# Patient Record
Sex: Male | Born: 1935
Health system: Southern US, Community
[De-identification: ages and names within clinical notes are randomized; demographics above are authoritative.]

## PROBLEM LIST (undated history)

## (undated) DIAGNOSIS — N529 Male erectile dysfunction, unspecified: Secondary | ICD-10-CM

## (undated) DIAGNOSIS — C61 Malignant neoplasm of prostate: Secondary | ICD-10-CM

## (undated) DIAGNOSIS — I10 Essential (primary) hypertension: Secondary | ICD-10-CM

## (undated) DIAGNOSIS — D649 Anemia, unspecified: Secondary | ICD-10-CM

## (undated) DIAGNOSIS — I2510 Atherosclerotic heart disease of native coronary artery without angina pectoris: Secondary | ICD-10-CM

## (undated) DIAGNOSIS — E785 Hyperlipidemia, unspecified: Secondary | ICD-10-CM

## (undated) DIAGNOSIS — L570 Actinic keratosis: Secondary | ICD-10-CM

## (undated) DIAGNOSIS — N189 Chronic kidney disease, unspecified: Secondary | ICD-10-CM

## (undated) DIAGNOSIS — I219 Acute myocardial infarction, unspecified: Secondary | ICD-10-CM

## (undated) DIAGNOSIS — K219 Gastro-esophageal reflux disease without esophagitis: Secondary | ICD-10-CM

## (undated) DIAGNOSIS — E039 Hypothyroidism, unspecified: Secondary | ICD-10-CM

## (undated) DIAGNOSIS — R42 Dizziness and giddiness: Secondary | ICD-10-CM

## (undated) HISTORY — PX: CHOLECYSTECTOMY: SHX55

## (undated) HISTORY — PX: INGUINAL HERNIA REPAIR: SUR1180

## (undated) HISTORY — DX: Actinic keratosis: L57.0

## (undated) HISTORY — PX: COLONOSCOPY: SHX174

## (undated) HISTORY — PX: PROSTATECTOMY: SHX69

---

## 2006-06-28 ENCOUNTER — Ambulatory Visit: Payer: Self-pay | Admitting: Surgery

## 2006-06-28 ENCOUNTER — Other Ambulatory Visit: Payer: Self-pay

## 2006-07-05 ENCOUNTER — Ambulatory Visit: Payer: Self-pay | Admitting: Surgery

## 2007-04-13 ENCOUNTER — Ambulatory Visit: Payer: Self-pay | Admitting: Unknown Physician Specialty

## 2008-04-16 ENCOUNTER — Ambulatory Visit: Payer: Self-pay | Admitting: Urology

## 2008-05-07 ENCOUNTER — Ambulatory Visit: Payer: Self-pay | Admitting: Urology

## 2008-05-31 ENCOUNTER — Inpatient Hospital Stay (HOSPITAL_COMMUNITY): Admission: RE | Admit: 2008-05-31 | Discharge: 2008-06-01 | Payer: Self-pay | Admitting: Urology

## 2008-05-31 ENCOUNTER — Encounter (INDEPENDENT_AMBULATORY_CARE_PROVIDER_SITE_OTHER): Payer: Self-pay | Admitting: Urology

## 2009-05-28 ENCOUNTER — Ambulatory Visit: Payer: Self-pay | Admitting: Urology

## 2009-12-04 ENCOUNTER — Ambulatory Visit: Payer: Self-pay | Admitting: Urology

## 2010-10-01 ENCOUNTER — Ambulatory Visit: Payer: Self-pay | Admitting: Unknown Physician Specialty

## 2010-12-24 ENCOUNTER — Ambulatory Visit: Payer: Self-pay | Admitting: Urology

## 2011-04-07 NOTE — Discharge Summary (Signed)
NAMESERVANDO, KYLLONEN             ACCOUNT NO.:  [id number]   MEDICAL RECORD NO.:  [id number]          PATIENT TYPE:  INP   LOCATION:  1440                         FACILITY:  [facility]   PHYSICIAN:  Heloise Purpura, MD      DATE OF BIRTH:  07-16-1935   DATE OF ADMISSION:  05/31/2008  DATE OF DISCHARGE:  06/01/2008                               DISCHARGE SUMMARY   ADMISSION DIAGNOSIS:  Prostate cancer.   DISCHARGE DIAGNOSIS:  Prostate cancer.   HISTORY:  For full details, please see admission history and physical.  Briefly, Mr. Aber is a 76 year old gentleman with clinically  localized adenocarcinoma of the prostate.  After discussion regarding  management options for treatment, he elected to proceed with surgical  therapy and a radical prostatectomy.   HOSPITAL COURSE:  On May 31, 2008, the patient was taken to the  operating room and underwent a robotic assisted laparoscopic radical  prostatectomy.  He tolerated this procedure well without complications.  Postoperatively, he was able to be transferred to a regular hospital  room.  He remained hemodynamically stable and his hematocrit on  postoperative day 1 was found to be stable at 33.7.  He maintained  excellent urine output with minimal output from his pelvic drain and his  drain was able to be removed.  He was able to begin ambulating which he  did without difficulty.  He was gradually transitioned to oral pain  medication and began a clear liquid diet.  On the afternoon of  postoperative day 1, he had met all discharge criteria and was able to  be discharged home in excellent condition.   DISPOSITION:  Home.   DISCHARGE MEDICATIONS:  1. He was instructed to resume his regular home medications excepting      any aspirin, nonsteroidal anti-inflammatory drugs or herbal      supplements.  2. He was given a prescription to begin Vicodin as needed for pain and      told to use Colace as a stool softener.  3. He was also given a  prescription to begin Cipro 1 day prior to his      return visit for Foley catheter removal.   DISCHARGE INSTRUCTIONS:  1. He was instructed to be ambulatory, but specifically told to      refrain from any heavy lifting, strenuous activity or driving.  2. He was given instructions on routine Foley catheter care and was      instructed to gradually advance his diet once passing flatus.   FOLLOW UP:  He will plan to follow up in 1 week for removal of his Foley  catheter and to discuss his surgical pathology in detail.      Heloise Purpura, MD  Electronically Signed     LB/MEDQ  D:  06/01/2008  T:  06/01/2008  Job:  [id number]

## 2011-04-07 NOTE — Op Note (Signed)
Daniel Lewis, Daniel Lewis             ACCOUNT NO.:  [id number]   MEDICAL RECORD NO.:  [id number]          PATIENT TYPE:  INP   LOCATION:  1610                         FACILITY:  [facility]   PHYSICIAN:  Heloise Purpura, MD      DATE OF BIRTH:  September 11, 1935   DATE OF PROCEDURE:  05/31/2008  DATE OF DISCHARGE:                               OPERATIVE REPORT   PREOPERATIVE DIAGNOSIS:  Clinically localized adenocarcinoma of the  prostate (clinical stage T1C, NX, M0).   POSTOPERATIVE DIAGNOSIS:  Clinically localized adenocarcinoma of the  prostate (clinical stage T1C, NX, M0).  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   PROCEDURE:  Robotic-assisted laparoscopic radical prostatectomy  (bilateral nerve sparing).   SURGEON:  Heloise Purpura, MD   ASSISTANT:  Veverly Fells. Vernie Ammons, M.D.   ANESTHESIA:  General.   COMPLICATIONS:  None.   ESTIMATED BLOOD LOSS:  Approximately 100 mL.   SPECIMENS:  Prostate and seminal vesicles.   DISPOSITION:  Specimen to pathology.   DRAINS:  1. A 20-French Coude catheter.  2. A #19 Blake pelvic drain.   INDICATION:  The patient is a 76 year old gentleman with clinically  localized adenocarcinoma of the prostate.  After discussion regarding  management options for treatment, he elected to proceed with surgical  therapy and the above procedure.  Potential risks, complications, and  alternative options were discussed with the patient in detail and  informed consent was obtained.   DESCRIPTION OF PROCEDURE:  The patient was taken to the operating room  and a general anesthetic was administered.  He was given preoperative  antibiotics, placed in the dorsal lithotomy position, and prepped and  draped in the usual sterile fashion.  Next, a preoperative time-out was  performed.  A Foley catheter was inserted into the bladder and a site  was selected just superior to the umbilicus for placement of the camera  port.  This was placed using a standard  open Hassan technique.  This  allowed entry into the peritoneal cavity under direct vision and without  difficulty.  A 12-mm port was then placed and a pneumoperitoneum was  established.  The 0-degree lens was used to inspect the abdomen and  there was no evidence for any intra-abdominal injuries or other  abnormalities.  The patient's prior laparoscopic mesh hernia repair was  noted with mesh along the anterior abdominal wall.   The remaining ports were then placed.  Bilateral 8-mm robotic ports were  placed 10 cm lateral to and just inferior to the camera port site.  An  additional 8-mm robotic port was placed in the far left lateral  abdominal wall.  A 5-mm port was placed between the camera port and the  right robotic port.  An additional 12-mm port was placed in the far  right lateral abdominal wall for laparoscopic assistance.  All ports  were placed under direct vision and without difficulty.  The surgical  cart was then docked.  With the aid of the cautery scissors, the bladder  was reflected posteriorly, allowing entry into space of Retzius and  identification of the endopelvic  fascia and prostate.  During this  dissection, there really was minimal adhesions noted between the  laparoscopic hernia mesh and this did not provide significant difficulty  entering the space of Retzius and exposing the prostate.  The endopelvic  fascia was then incised from the apex back to the base of the prostate  bilaterally and the underlying levator muscle fibers were swept  laterally off the prostate.  The dorsal venous complex was then stapled  and divided with a 45-mm Flex ETS stapler.  The bladder neck was  identified with the aid of Foley catheter manipulation and was divided  anteriorly thereby exposing the catheter.   On inspection of the prostatic urethra, the patient was noted to have a  large right lateral lobe protruding into the bladder.  The bladder neck  did necessitate a fairly  extensive dissection to separate the prostatic  adenoma off of the bladder which did result in a somewhat large bladder  neck.  However, the bladder neck, which was somewhat thin, was carefully  dissected free from the prostatic tissue in order to minimize the large  size of the bladder neck.   Dissection proceeded between the bladder neck and prostate until the  vasa deferentia and seminal vesicles were identified.  On inspection of  the vas deferens, there initially appeared to be two vas deferens in the  midline.  The right vas deferens was isolated, divided and lifted  anteriorly.  The seminal vesicle on the right was then dissected down to  its tip with care to control the seminal vesicle arterial blood supply.  It was then lifted anteriorly.  On the left side, no vas deferens was  appreciated nor was a seminal vesicle. Of note, the patient was noted to  have absence of his left kidney on his preoperative bone scan.  Dr.  Vernie Ammons inspected the patient's scrotum and did not feel an obvious vas  deferens within the left scrotum consistent with left vasal and seminal  vesicle agenesis.  The space between the Denonvilliers' fascia and the  anterior rectum was then bluntly developed thereby isolating the  vascular pedicle of the prostate.  On the left side, dissection  proceeded until the vascular pedicle was isolated.  Again, no vas  deferens or seminal vesicle was seen on this side.   Attention was then turned to the nerve-sparing portion of the procedure.  The lateral prostatic fascia was incised bilaterally allowing the  neurovascular bundles to be swept laterally and posteriorly off the  prostate.  This isolated the vascular pedicles of the prostate which  were then ligated with Hem-o-lok clips and divided with sharp cold  scissor dissection above the level of the neurovascular bundles.  The  neurovascular bundles were then swept off the apex of prostate and  urethra.  The urethra  was then divided, allowing it to be sharply  disarticulated.  The pelvis was then copiously irrigated and hemostasis  was ensured.  There was no evidence for a rectal injury.   Attention then turned to the urethral anastomosis.  Prior to the  anastomosis, the bladder neck was reconstructed with figure-of-eight 3-0  Vicryl sutures at the 5 and 7 o'clock position.  A 2-0 Vicryl slip-knot  was then placed between Denonvilliers' fascia, the posterior urethra and  the posterior bladder neck to reapproximate these structures.  A double-  armed 3-0 Monocryl suture was then used to perform a 360-degree running  tension-free anastomosis between the bladder neck and urethra.  A new 20-  [place] Coude catheter was inserted into the bladder and irrigated.  The  anastomosis appeared to be watertight and there were no blood clots  within the bladder.  A #19 Blake drain was brought through the left  robotic port and appropriately positioned in the pelvis.  It was secured  to the skin with a nylon suture.   The surgical cart was then undocked.  The right lateral 12-mm port site  was closed with a 0 Vicryl suture placed with the aid of the suture  passer device.  All remaining ports were then removed under direct  vision and the prostate was removed intact within the Endopouch  retrieval bag.  This fascial opening was then closed with a running 0  Vicryl suture.  All port sites were injected with 0.25% Marcaine and  reapproximated at the skin level with staples.  Sterile dressings were  applied.  The patient appeared to tolerate the procedure well without  complications.  He was able to be extubated and transferred to the  recovery unit in satisfactory condition.      Heloise Purpura, MD  Electronically Signed     LB/MEDQ  D:  05/31/2008  T:  05/31/2008  Job:  [id number]

## 2011-05-01 ENCOUNTER — Ambulatory Visit: Payer: Self-pay | Admitting: Surgery

## 2011-05-08 ENCOUNTER — Ambulatory Visit: Payer: Self-pay | Admitting: Surgery

## 2011-05-12 ENCOUNTER — Ambulatory Visit: Payer: Self-pay | Admitting: Unknown Physician Specialty

## 2011-05-15 ENCOUNTER — Ambulatory Visit: Payer: Self-pay | Admitting: Unknown Physician Specialty

## 2011-08-20 LAB — BASIC METABOLIC PANEL
CO2: 32
Calcium: 9.3
Creatinine, Ser: 1.03
GFR calc Af Amer: >60
Glucose, Bld: 84

## 2011-08-20 LAB — TYPE AND SCREEN
ABO/RH(D): A NEG
Antibody Screen: NEGATIVE

## 2011-08-20 LAB — CBC
MCHC: 34.1
RBC: 4.21 — ABNORMAL LOW
RDW: 12.7

## 2012-08-10 DIAGNOSIS — N529 Male erectile dysfunction, unspecified: Secondary | ICD-10-CM | POA: Insufficient documentation

## 2012-08-10 DIAGNOSIS — N393 Stress incontinence (female) (male): Secondary | ICD-10-CM | POA: Insufficient documentation

## 2012-08-10 DIAGNOSIS — N200 Calculus of kidney: Secondary | ICD-10-CM | POA: Insufficient documentation

## 2012-08-10 DIAGNOSIS — R339 Retention of urine, unspecified: Secondary | ICD-10-CM | POA: Insufficient documentation

## 2012-08-10 DIAGNOSIS — C61 Malignant neoplasm of prostate: Secondary | ICD-10-CM | POA: Insufficient documentation

## 2012-08-10 DIAGNOSIS — Q600 Renal agenesis, unilateral: Secondary | ICD-10-CM | POA: Insufficient documentation

## 2012-08-10 DIAGNOSIS — R350 Frequency of micturition: Secondary | ICD-10-CM | POA: Insufficient documentation

## 2013-09-18 DIAGNOSIS — Z8546 Personal history of malignant neoplasm of prostate: Secondary | ICD-10-CM | POA: Insufficient documentation

## 2013-09-18 DIAGNOSIS — N139 Obstructive and reflux uropathy, unspecified: Secondary | ICD-10-CM | POA: Insufficient documentation

## 2014-02-21 ENCOUNTER — Emergency Department: Payer: Self-pay | Admitting: Internal Medicine

## 2014-04-10 DIAGNOSIS — I2510 Atherosclerotic heart disease of native coronary artery without angina pectoris: Secondary | ICD-10-CM | POA: Insufficient documentation

## 2014-04-10 DIAGNOSIS — E785 Hyperlipidemia, unspecified: Secondary | ICD-10-CM | POA: Insufficient documentation

## 2014-04-10 DIAGNOSIS — I10 Essential (primary) hypertension: Secondary | ICD-10-CM | POA: Insufficient documentation

## 2014-04-10 DIAGNOSIS — E039 Hypothyroidism, unspecified: Secondary | ICD-10-CM | POA: Insufficient documentation

## 2014-04-10 DIAGNOSIS — D649 Anemia, unspecified: Secondary | ICD-10-CM | POA: Insufficient documentation

## 2015-09-03 ENCOUNTER — Encounter: Payer: Self-pay | Admitting: Emergency Medicine

## 2015-09-03 ENCOUNTER — Emergency Department
Admission: EM | Admit: 2015-09-03 | Discharge: 2015-09-04 | Disposition: A | Discharge status: Home / Self Care | Payer: PPO | Attending: Emergency Medicine | Admitting: Emergency Medicine

## 2015-09-03 ENCOUNTER — Emergency Department: Payer: PPO

## 2015-09-03 DIAGNOSIS — Z7982 Long term (current) use of aspirin: Secondary | ICD-10-CM | POA: Diagnosis not present

## 2015-09-03 DIAGNOSIS — Z79899 Other long term (current) drug therapy: Secondary | ICD-10-CM | POA: Insufficient documentation

## 2015-09-03 DIAGNOSIS — R42 Dizziness and giddiness: Secondary | ICD-10-CM | POA: Diagnosis present

## 2015-09-03 DIAGNOSIS — H8110 Benign paroxysmal vertigo, unspecified ear: Secondary | ICD-10-CM

## 2015-09-03 HISTORY — DX: Malignant neoplasm of prostate: C61

## 2015-09-03 HISTORY — DX: Acute myocardial infarction, unspecified: I21.9

## 2015-09-03 LAB — BASIC METABOLIC PANEL
ANION GAP: 6 (ref 5–15)
BUN: 28 mg/dL — ABNORMAL HIGH (ref 6–20)
CHLORIDE: 105 mmol/L (ref 101–111)
CO2: 29 mmol/L (ref 22–32)
Calcium: 8.9 mg/dL (ref 8.9–10.3)
Creatinine, Ser: 1.21 mg/dL (ref 0.61–1.24)
GFR calc Af Amer: >60 mL/min (ref >60)
GFR calc non Af Amer: 56 mL/min — ABNORMAL LOW (ref >60)
GLUCOSE: 214 mg/dL — AB (ref 65–99)
POTASSIUM: 4.2 mmol/L (ref 3.5–5.1)
Sodium: 140 mmol/L (ref 135–145)

## 2015-09-03 LAB — CBC
HEMATOCRIT: 39.4 % — AB (ref 40.0–52.0)
HEMOGLOBIN: 13.7 g/dL (ref 13.0–18.0)
MCH: 32.8 pg (ref 26.0–34.0)
MCHC: 34.8 g/dL (ref 32.0–36.0)
MCV: 94.1 fL (ref 80.0–100.0)
Platelets: 208 10*3/uL (ref 150–440)
RBC: 4.19 MIL/uL — ABNORMAL LOW (ref 4.40–5.90)
RDW: 12.7 % (ref 11.5–14.5)
WBC: 8.2 10*3/uL (ref 3.8–10.6)

## 2015-09-03 LAB — GLUCOSE, CAPILLARY: GLUCOSE-CAPILLARY: 197 mg/dL — AB (ref 65–99)

## 2015-09-03 LAB — TROPONIN I: Troponin I: <0.03 ng/mL (ref <0.031)

## 2015-09-03 MED ORDER — MECLIZINE HCL 12.5 MG PO TABS: 12.5000 mg | ORAL_TABLET | Freq: Three times a day (TID) | ORAL | Status: AC | PRN

## 2015-09-03 MED ORDER — ONDANSETRON HCL 4 MG PO TABS: 4.0000 mg | ORAL_TABLET | Freq: Three times a day (TID) | ORAL | Status: DC | PRN

## 2015-09-03 MED ORDER — TRAMADOL HCL 50 MG PO TABS: 50.0000 mg | ORAL_TABLET | Freq: Four times a day (QID) | ORAL | Status: DC | PRN

## 2015-09-03 MED ORDER — MECLIZINE HCL 25 MG PO TABS
25.0000 mg | ORAL_TABLET | Freq: Once | ORAL | Status: AC
  Administered 2015-09-03: 25 mg via ORAL
  Filled 2015-09-03: qty 1

## 2015-09-03 NOTE — ED Notes (Signed)
Pt uprite on stretcher in exam room with no distress noted; reports since Sunday having intermittent periods of frontal HA accomp by dizziness and nausea; st hx of vertigo but not accomp by HA or vomiting; pt denies any c/o at present; A&Ox3, PERRL, MAEW, speech clear

## 2015-09-03 NOTE — ED Provider Notes (Signed)
Time Seen: Approximately insert time stent  I have reviewed the triage notes  Chief Complaint: Dizziness; Emesis; Headache; and Near Syncope   History of Present Illness: Daniel Lewis is a 80 y.o. male who is pleasant gentleman who states that he's developed some periodic feelings of dizziness. On further investigation and he describes transient vertigo type symptoms of feeling off balance for "" a couple of seconds."". The patient states he also has an associated frontal headache. He denies any weakness in either upper or lower extremities. He denies any difficulty with blurred vision, visual field deficit deficits, posterior neck pain, patient states he's had some occasional nausea and vomited 1 with no blood or bile. He describes his symptoms as occurring mostly with ambulation but also developed similar symptoms tonight and while he was on church and in Sunday while he was at a seated position. He has no persistent vertiginous symptoms and denies any ear pain. He describes an episode where he felt his heartbeat in his ureter but otherwise no consistent symptoms.   Past Medical History  Diagnosis Date  . MI (myocardial infarction) ([place])   . Prostate cancer ([place])     There are no active problems to display for this patient.   Past Surgical History  Procedure Laterality Date  . Inguinal hernia repair    . Prostatectomy      Past Surgical History  Procedure Laterality Date  . Inguinal hernia repair    . Prostatectomy      Current Outpatient Rx  Name  Route  Sig  Dispense  Refill  . aspirin EC 81 MG tablet   Oral   Take 1 tablet by mouth daily.         Marland Kitchen atenolol (TENORMIN) 50 MG tablet   Oral   Take 1 tablet by mouth daily.      0   . SYNTHROID 75 MCG tablet   Oral   Take 1 tablet by mouth every morning.      0     Dispense as written.   . TRAVATAN Z 0.004 % SOLN ophthalmic solution   Both Eyes   Place 1 drop into both eyes at bedtime.      0      Dispense as written.   . vitamin C (ASCORBIC ACID) 500 MG tablet   Oral   Take 1 tablet by mouth daily.         . meclizine (ANTIVERT) 12.5 MG tablet   Oral   Take 1 tablet (12.5 mg total) by mouth 3 (three) times daily as needed for dizziness or nausea.   12 tablet   1   . ondansetron (ZOFRAN) 4 MG tablet   Oral   Take 1 tablet (4 mg total) by mouth every 8 (eight) hours as needed for nausea or vomiting.   21 tablet   0   . traMADol (ULTRAM) 50 MG tablet   Oral   Take 1 tablet (50 mg total) by mouth every 6 (six) hours as needed.   20 tablet   0     Allergies:  Review of patient's allergies indicates no known allergies.  Family History: No family history on file.  Social History: Social History  Substance Use Topics  . Smoking status: Never Smoker   . Smokeless tobacco: Never Used  . Alcohol Use: No     Review of Systems:   10 point review of systems was performed and was otherwise negative:  Constitutional: No  fever Eyes: No visual disturbances ENT: No sore throat, ear pain Cardiac: No chest pain Respiratory: No shortness of breath, wheezing, or stridor Abdomen: No abdominal pain, no vomiting, No diarrhea Endocrine: No weight loss, No night sweats Extremities: No peripheral edema, cyanosis Skin: No rashes, easy bruising Neurologic: No focal weakness, trouble with speech or swollowing Urologic: No dysuria, Hematuria, or urinary frequency   Physical Exam:  ED Triage Vitals  Enc Vitals Group     BP 09/03/15 2059 165/75 mmHg     Pulse Rate 09/03/15 2059 5     Resp 09/03/15 2059 20     Temp 09/03/15 2059 98.1 F (36.7 C)     Temp Source 09/03/15 2059 Oral     SpO2 09/03/15 2059 96 %     Weight 09/03/15 2059 140 lb (63.504 kg)     Height 09/03/15 2059 5\' 9"  (1.753 m)     Head Cir --      Peak Flow --      Pain Score 09/03/15 2059 0     Pain Loc --      Pain Edu? --      Excl. in [place]? --     General: Awake , Alert , and Oriented times 3; GCS  15 Head: Normal cephalic , atraumatic Eyes: Pupils equal , round, reactive to light. Patient has 5 beat left to upper nystagmus which is exhaustive. Nose/Throat: No nasal drainage, patent upper airway without erythema or exudate.  TMs are negative for erythema, exudate, or lesions in the external ear canal Neck: Supple, Full range of motion, No anterior adenopathy or palpable thyroid masses. No carotid bruits Lungs: Clear to ascultation without wheezes , rhonchi, or rales Heart: Regular rate, regular rhythm without murmurs , gallops , or rubs Abdomen: Soft, non tender without rebound, guarding , or rigidity; bowel sounds positive and symmetric in all 4 quadrants. No organomegaly .        Extremities: 2 plus symmetric pulses. No edema, clubbing or cyanosis Neurologic: normal ambulation, Motor symmetric without deficits, sensory intact. normal gait Negative drift and cerebellar signs Skin: warm, dry, no rashes   Labs:   All laboratory work was reviewed including any pertinent negatives or positives listed below:  Labs Reviewed  BASIC METABOLIC PANEL - Abnormal; Notable for the following:    Glucose, Bld 214 (*)    BUN 28 (*)    GFR calc non Af Amer 56 (*)    All other components within normal limits  CBC - Abnormal; Notable for the following:    RBC 4.19 (*)    HCT 39.4 (*)    All other components within normal limits  GLUCOSE, CAPILLARY - Abnormal; Notable for the following:    Glucose-Capillary 197 (*)    All other components within normal limits  TROPONIN I  CBG MONITORING, ED   patient's laboratory work was reviewed with no significant abnormalities  EKG:  ED ECG REPORT I, Daymon Larsen, the attending physician, personally viewed and interpreted this ECG.  Date: 09/03/2015 EKG Time: 2118 Rate: 50 Rhythm: normal sinus rhythm QRS Axis: normal Intervals: normal ST/T Wave abnormalities: normal Conduction Disutrbances: none Narrative Interpretation:  unremarkable    Radiology: EXAM: CT HEAD WITHOUT CONTRAST  TECHNIQUE: Contiguous axial images were obtained from the base of the skull through the vertex without intravenous contrast.  COMPARISON: 02/21/2014 and prior studies  FINDINGS: Mild generalized cerebral volume loss and mild chronic small-vessel white matter ischemic changes again noted.  No acute  intracranial abnormalities are identified, including mass lesion or mass effect, hydrocephalus, extra-axial fluid collection, midline shift, hemorrhage, or acute infarction.  The visualized bony calvarium is unremarkable.  IMPRESSION: No evidence of acute intracranial abnormality.  Mild atrophy and chronic small-vessel white matter ischemic changes     I personally reviewed the radiologic studies     ED Course:  Differential diagnosis includes but is not exclusive to subarachnoid hemorrhage, meningitis, encephalitis, previous head trauma, cavernous venous thrombosis, muscle tension headache, temporal arteritis, migraine or migraine equivalent, etc.  Given the patient's presentation is not seem to be a life-threatening cause for his headache. He describes a pressure final headache without any photophobia weakness no pain fever or any other concerns. CT was negative and he does have some stagnant mass on physical exam and I felt this was most likely to be peripheral vertigo answers no cerebellar signs etc. Patient exhibits no signs of weakness in either upper or lower extremities otherwise during my evaluation felt fine.    Assessment:  Acute unspecified headache Peripheral vertigo     Plan:  Outpatient management Patient was advised to return immediately if condition worsens. Patient was advised to follow up with her primary care physician or other specialized physicians involved and in their current assessment. Patient was prescribed Antivert along with Ultram for his headache. He may need further outpatient  evaluation including an MRI.Daymon Larsen, MD 09/03/15 2325

## 2015-09-03 NOTE — Discharge Instructions (Signed)

## 2015-09-03 NOTE — ED Notes (Signed)
Pt presents to ED with periods of dizziness and nausea since Sunday. Pt reports today his symptoms worsened and he developed a severe headache and vomited X2. States at times he feels like he is going to pass out. Hx of MI; denies chest pain or sob. Pt currently alert and calm with no increased work of breathing or acute distress noted.

## 2015-11-09 ENCOUNTER — Emergency Department
Admission: EM | Admit: 2015-11-09 | Discharge: 2015-11-09 | Disposition: A | Discharge status: Home / Self Care | Payer: PPO | Attending: Emergency Medicine | Admitting: Emergency Medicine

## 2015-11-09 DIAGNOSIS — W540XXA Bitten by dog, initial encounter: Secondary | ICD-10-CM | POA: Insufficient documentation

## 2015-11-09 DIAGNOSIS — Y9289 Other specified places as the place of occurrence of the external cause: Secondary | ICD-10-CM | POA: Diagnosis not present

## 2015-11-09 DIAGNOSIS — S31159A Open bite of abdominal wall, unspecified quadrant without penetration into peritoneal cavity, initial encounter: Secondary | ICD-10-CM | POA: Diagnosis present

## 2015-11-09 DIAGNOSIS — S3131XA Laceration without foreign body of scrotum and testes, initial encounter: Secondary | ICD-10-CM | POA: Insufficient documentation

## 2015-11-09 DIAGNOSIS — Z79899 Other long term (current) drug therapy: Secondary | ICD-10-CM | POA: Insufficient documentation

## 2015-11-09 DIAGNOSIS — Z7982 Long term (current) use of aspirin: Secondary | ICD-10-CM | POA: Insufficient documentation

## 2015-11-09 DIAGNOSIS — Y998 Other external cause status: Secondary | ICD-10-CM | POA: Insufficient documentation

## 2015-11-09 DIAGNOSIS — Y9389 Activity, other specified: Secondary | ICD-10-CM | POA: Insufficient documentation

## 2015-11-09 DIAGNOSIS — S31139A Puncture wound of abdominal wall without foreign body, unspecified quadrant without penetration into peritoneal cavity, initial encounter: Secondary | ICD-10-CM

## 2015-11-09 DIAGNOSIS — S3123XA Puncture wound without foreign body of penis, initial encounter: Secondary | ICD-10-CM | POA: Insufficient documentation

## 2015-11-09 MED ORDER — AMOXICILLIN-POT CLAVULANATE 875-125 MG PO TABS: 1.0000 | ORAL_TABLET | Freq: Two times a day (BID) | ORAL | Status: DC

## 2015-11-09 MED ORDER — AMOXICILLIN-POT CLAVULANATE 875-125 MG PO TABS
1.0000 | ORAL_TABLET | Freq: Once | ORAL | Status: AC
  Administered 2015-11-09: 1 via ORAL
  Filled 2015-11-09: qty 1

## 2015-11-09 NOTE — ED Notes (Signed)
Dog bite to groin. Break in skin, no bleeding at this time. Pt denies pain. Pt alert and oriented X4, active, cooperative, pt in NAD. RR even and unlabored, color WNL.  Neighbors dog.

## 2015-11-09 NOTE — ED Notes (Signed)
Pt bit by his son's dog in the groin. Has a laceration to penis and a puncture wound to the scrotum. Bleeding controlled but oozes when he moves.

## 2015-11-09 NOTE — ED Provider Notes (Signed)
[facility] Emergency Department Provider Note  ____________________________________________  Time seen: Approximately 7:45 PM  I have reviewed the triage vital signs and the nursing notes.   HISTORY  Chief Complaint Animal Bite    HPI Daniel Lewis is a 80 y.o. male who presents emergency department for a dog bite to his groin. He states that he was messing around with his son's dog when he was actively. The groin area. States he has a laceration to this scrotum and a small puncture wound to his penis. He states that bleeding is controlled with a lot of movement is starts to ooze again. Patient denies any dysuria or hematuria. No other injury or complaints. He states the pain is minimal.He has received a tetanus shot within the last year.   Past Medical History  Diagnosis Date  . MI (myocardial infarction) ([place])   . Prostate cancer ([place])     There are no active problems to display for this patient.   Past Surgical History  Procedure Laterality Date  . Inguinal hernia repair    . Prostatectomy      Current Outpatient Rx  Name  Route  Sig  Dispense  Refill  . amoxicillin-clavulanate (AUGMENTIN) 875-125 MG tablet   Oral   Take 1 tablet by mouth 2 (two) times daily.   14 tablet   0   . aspirin EC 81 MG tablet   Oral   Take 1 tablet by mouth daily.         Marland Kitchen atenolol (TENORMIN) 50 MG tablet   Oral   Take 1 tablet by mouth daily.      0   . ondansetron (ZOFRAN) 4 MG tablet   Oral   Take 1 tablet (4 mg total) by mouth every 8 (eight) hours as needed for nausea or vomiting.   21 tablet   0   . SYNTHROID 75 MCG tablet   Oral   Take 1 tablet by mouth every morning.      0     Dispense as written.   . traMADol (ULTRAM) 50 MG tablet   Oral   Take 1 tablet (50 mg total) by mouth every 6 (six) hours as needed.   20 tablet   0   . TRAVATAN Z 0.004 % SOLN ophthalmic solution   Both Eyes   Place 1 drop into both eyes at  bedtime.      0     Dispense as written.   . vitamin C (ASCORBIC ACID) 500 MG tablet   Oral   Take 1 tablet by mouth daily.           Allergies Review of patient's allergies indicates no known allergies.  No family history on file.  Social History Social History  Substance Use Topics  . Smoking status: Never Smoker   . Smokeless tobacco: Never Used  . Alcohol Use: No    Review of Systems Constitutional: No fever/chills Eyes: No visual changes. ENT: No sore throat. Cardiovascular: Denies chest pain. Respiratory: Denies shortness of breath. Gastrointestinal: No abdominal pain.  No nausea, no vomiting.  No diarrhea.  No constipation. Genitourinary: Negative for dysuria. Musculoskeletal: Negative for back pain. Skin: Negative for rash. Puncture wound to the penis and laceration to scrotum. Neurological: Negative for headaches, focal weakness or numbness.  10-point ROS otherwise negative.  ____________________________________________   PHYSICAL EXAM:  VITAL SIGNS: ED Triage Vitals  Enc Vitals Group     BP 11/09/15 1745 146/59 mmHg  Pulse Rate 11/09/15 1745 58     Resp 11/09/15 1745 20     Temp 11/09/15 1745 98 F (36.7 C)     Temp Source 11/09/15 1745 Oral     SpO2 11/09/15 1745 96 %     Weight 11/09/15 1745 150 lb (68.04 kg)     Height 11/09/15 1745 5\' 9"  (1.753 m)     Head Cir --      Peak Flow --      Pain Score --      Pain Loc --      Pain Edu? --      Excl. in [place]? --     Constitutional: Alert and oriented. Well appearing and in no acute distress. Eyes: Conjunctivae are normal. PERRL. EOMI. Head: Atraumatic. Nose: No congestion/rhinnorhea. Mouth/Throat: Mucous membranes are moist.  Oropharynx non-erythematous. Neck: No stridor.   Cardiovascular: Normal rate, regular rhythm. Grossly normal heart sounds.  Good peripheral circulation. Respiratory: Normal respiratory effort.  No retractions. Lungs CTAB. Gastrointestinal: Soft and nontender. No  distention. No abdominal bruits. No CVA tenderness. Musculoskeletal: No lower extremity tenderness nor edema.  No joint effusions. Neurologic:  Normal speech and language. No gross focal neurologic deficits are appreciated. No gait instability. Skin:  Skin is warm, dry and intact. No rash noted. Patient has a small puncture wound to the lateral aspect of the right side of his penis. Area is not bleeding. Patient also has a laceration to the left scrotum. Area is mainly scabbed over with minimal oozing noted on the proximal edge of the laceration. Laceration is superficial in nature. This laceration does not penetrate the scrotum. Psychiatric: Mood and affect are normal. Speech and behavior are normal.  ____________________________________________   LABS (all labs ordered are listed, but only abnormal results are displayed)  Labs Reviewed - No data to display ____________________________________________  EKG   ____________________________________________  RADIOLOGY   ____________________________________________   PROCEDURES  Procedure(s) performed: None  Critical Care performed: No  ____________________________________________   INITIAL IMPRESSION / ASSESSMENT AND PLAN / ED COURSE  Pertinent labs & imaging results that were available during my care of the patient were reviewed by me and considered in my medical decision making (see chart for details).  The patient presents emergency Department with a dog bite to his groin. Inspection reveals laceration and puncture wound. Minimal bleeding noted that is controlled at rest. Laceration does not puncture into the scrotum. Patient will be discharged home with antibiotics and strict precautions to return for any worsening of symptoms or continued bleeding. Patient given instructions on care to area.   New Prescriptions   AMOXICILLIN-CLAVULANATE (AUGMENTIN) 875-125 MG TABLET    Take 1 tablet by mouth 2 (two) times daily.     ____________________________________________   FINAL CLINICAL IMPRESSION(S) / ED DIAGNOSES  Final diagnoses:  Dog bite  Puncture wound of groin, initial encounter      Darletta Moll, PA-C 11/09/15 2021  Nena Polio, MD 11/10/15 [phone]

## 2015-11-09 NOTE — Discharge Instructions (Signed)
Puncture Wound A puncture wound is an injury that is caused by a sharp, thin object that goes through (penetrates) your skin. Usually, a puncture wound does not leave a large opening in your skin, so it may not bleed a lot. However, when you get a puncture wound, dirt or other materials (foreign bodies) can be forced into your wound and break off inside. This increases the chance of infection, such as tetanus. CAUSES Puncture wounds are caused by any sharp, thin object that goes through your skin, such as:  Animal teeth, as with an animal bite.  Sharp, pointed objects, such as nails, splinters of glass, fishhooks, and needles. SYMPTOMS Symptoms of a puncture wound include:  Pain.  Bleeding.  Swelling.  Bruising.  Fluid leaking from the wound.  Numbness, tingling, or loss of function. DIAGNOSIS This condition is diagnosed with a medical history and physical exam. Your wound will be checked to see if it contains any foreign bodies. You may also have X-rays or other imaging tests. TREATMENT Treatment for a puncture wound depends on how serious the wound is. It also depends on whether the wound contains any foreign bodies. Treatment for all types of puncture wounds usually starts with:  Controlling the bleeding.  Washing out the wound with a germ-free (sterile) salt-water solution.  Checking the wound for foreign bodies. Treatment may also include:  Having the wound opened surgically to remove a foreign object.  Closing the wound with stitches (sutures) if it continues to bleed.  Covering the wound with antibiotic ointments and a bandage (dressing).  Receiving a tetanus shot.  Receiving a rabies vaccine. HOME CARE INSTRUCTIONS Medicines  Take or apply over-the-counter and prescription medicines only as told by your health care provider.  If you were prescribed an antibiotic, take or apply it as told by your health care provider. Do not stop using the antibiotic even if  your condition improves. Wound Care  There are many ways to close and cover a wound. For example, a wound can be covered with sutures, skin glue, or adhesive strips. Follow instructions from your health care provider about:  How to take care of your wound.  When and how you should change your dressing.  When you should remove your dressing.  Removing whatever was used to close your wound.  Keep the dressing dry as told by your health care provider. Do not take baths, swim, use a hot tub, or do anything that would put your wound underwater until your health care provider approves.  Clean the wound as told by your health care provider.  Do not scratch or pick at the wound.  Check your wound every day for signs of infection. Watch for:  Redness, swelling, or pain.  Fluid, blood, or pus. General Instructions  Raise (elevate) the injured area above the level of your heart while you are sitting or lying down.  If your puncture wound is in your foot, ask your health care provider if you need to avoid putting weight on your foot and for how long.  Keep all follow-up visits as told by your health care provider. This is important. SEEK MEDICAL CARE IF:  You received a tetanus shot and you have swelling, severe pain, redness, or bleeding at the injection site.  You have a fever.  Your sutures come out.  You notice a bad smell coming from your wound or your dressing.  You notice something coming out of your wound, such as wood or glass.  Your   pain is not controlled with medicine.  You have increased redness, swelling, or pain at the site of your wound.  You have fluid, blood, or pus coming from your wound.  You notice a change in the color of your skin near your wound.  You need to change the dressing frequently due to fluid, blood, or pus draining from your wound.  You develop a new rash.  You develop numbness around your wound. SEEK IMMEDIATE MEDICAL CARE IF:  You  develop severe swelling around your wound.  Your pain suddenly increases and is severe.  You develop painful skin lumps.  You have a red streak going away from your wound.  The wound is on your hand or foot and you cannot properly move a finger or toe.  The wound is on your hand or foot and you notice that your fingers or toes look pale or bluish.   This information is not intended to replace advice given to you by your health care provider. Make sure you discuss any questions you have with your health care provider.   Document Released: 08/19/2005 Document Revised: 07/31/2015 Document Reviewed: 01/02/2015 Elsevier Interactive Patient Education 2016 [place] Taking care of your wound properly can help to prevent pain and infection. It can also help your wound to heal more quickly.  HOW TO CARE FOR YOUR WOUND  Take or apply over-the-counter and prescription medicines only as told by your health care provider.  If you were prescribed antibiotic medicine, take or apply it as told by your health care provider. Do not stop using the antibiotic even if your condition improves.  Clean the wound each day or as told by your health care provider.  Wash the wound with mild soap and water.  Rinse the wound with water to remove all soap.  Pat the wound dry with a clean towel. Do not rub it.  There are many different ways to close and cover a wound. For example, a wound can be covered with stitches (sutures), skin glue, or adhesive strips. Follow instructions from your health care provider about:  How to take care of your wound.  When and how you should change your bandage (dressing).  When you should remove your dressing.  Removing whatever was used to close your wound.  Check your wound every day for signs of infection. Watch for:  Redness, swelling, or pain.  Fluid, blood, or pus.  Keep the dressing dry until your health care provider says it can be removed.  Do not take baths, swim, use a hot tub, or do anything that would put your wound underwater until your health care provider approves.  Raise (elevate) the injured area above the level of your heart while you are sitting or lying down.  Do not scratch or pick at the wound.  Keep all follow-up visits as told by your health care provider. This is important. SEEK MEDICAL CARE IF:  You received a tetanus shot and you have swelling, severe pain, redness, or bleeding at the injection site.  You have a fever.  Your pain is not controlled with medicine.  You have increased redness, swelling, or pain at the site of your wound.  You have fluid, blood, or pus coming from your wound.  You notice a bad smell coming from your wound or your dressing. SEEK IMMEDIATE MEDICAL CARE IF:  You have a red streak going away from your wound.   This information is not intended to replace  advice given to you by your health care provider. Make sure you discuss any questions you have with your health care provider.   Document Released: 08/18/2008 Document Revised: 03/26/2015 Document Reviewed: 11/05/2014 Elsevier Interactive Patient Education 2016 [employer].   Ship broker bites can range from mild to serious. An animal bite can result in a scratch on the skin, a deep open cut, a puncture of the skin, a crush injury, or tearing away of the skin or a body part. A small bite from a house pet will usually not cause serious problems. However, some animal bites can become infected or injure a bone or other tissue.  Bites from certain animals can be more dangerous because of the risk of spreading rabies, which is a serious viral infection. This risk is higher with bites from stray animals or wild animals, such as raccoons, foxes, skunks, and bats. Dogs are responsible for most animal bites. Children are bitten more often than adults.  SYMPTOMS  Common symptoms of an animal bite include:  Pain.    Bleeding.  Swelling.  Bruising.  DIAGNOSIS  This condition may be diagnosed based on a physical exam and medical history. Your health care provider will examine the wound and ask for details about the animal and how the bite happened. You may also have tests, such as:  Blood tests to check for infection or to determine if surgery is needed.  X-rays to check for damage to bones or joints.  Culture test. This uses a sample of fluid from the wound to check for infection. TREATMENT  Treatment varies depending on the location and type of animal bite and your medical history. Treatment may include:  Wound care. This often includes cleaning the wound, flushing the wound with saline solution, and applying a bandage (dressing). Sometimes, the wound is left open to heal because of the high risk of infection. However, in some cases, the wound may be closed with stitches (sutures), staples, skin glue, or adhesive strips.  Antibiotic medicine.  Tetanus shot.  Rabies treatment if the animal could have rabies.  In some cases, bites that have become infected may require IV antibiotics and surgical treatment in the hospital.  [place]  Follow instructions from your health care provider about how to take care of your wound. Make sure you:  Wash your hands with soap and water before you change your dressing. If soap and water are not available, use hand sanitizer.  Change your dressing as told by your health care provider.  Leave sutures, skin glue, or adhesive strips in place. These skin closures may need to be in place for 2 weeks or longer. If adhesive strip edges start to loosen and curl up, you may trim the loose edges. Do not remove adhesive strips completely unless your health care provider tells you to do that. Check your wound every day for signs of infection. Watch for:  Increasing redness, swelling, or pain.  Fluid, blood, or pus.  General Instructions  Take or apply  over-the-counter and prescription medicines only as told by your health care provider.  If you were prescribed an antibiotic, take or apply it as told by your health care provider. Do not stop using the antibiotic even if your condition improves.  Keep the injured area raised (elevated) above the level of your heart while you are sitting or lying down, if this is possible.  If directed, apply ice to the injured area.  Put ice in a plastic bag.  Place a towel between your skin and the bag.  Leave the ice on for 20 minutes, 2-3 times per day.  Keep all follow-up visits as told by your health care provider. This is important.  SEEK MEDICAL CARE IF:  You have increasing redness, swelling, or pain at the site of your wound.  You have a general feeling of sickness (malaise).  You feel nauseous or you vomit.  You have pain that does not get better.  SEEK IMMEDIATE MEDICAL CARE IF:  You have a red streak extending away from your wound.  You have fluid, blood, or pus coming from your wound.  You have a fever or chills.  You have trouble moving your injured area.  You have numbness or tingling extending beyond the wound. This information is not intended to replace advice given to you by your health care provider. Make sure you discuss any questions you have with your health care provider.  Document Released: 07/28/2011 Document Revised: 07/31/2015 Document Reviewed: 03/27/2015  Elsevier Interactive Patient Education [employer].

## 2015-12-16 ENCOUNTER — Ambulatory Visit: Payer: PPO | Admitting: Anesthesiology

## 2015-12-16 ENCOUNTER — Encounter: Payer: Self-pay | Admitting: Anesthesiology

## 2015-12-16 ENCOUNTER — Ambulatory Visit
Admission: RE | Admit: 2015-12-16 | Discharge: 2015-12-16 | Disposition: A | Discharge status: Home / Self Care | Payer: PPO | Source: Ambulatory Visit | Attending: Unknown Physician Specialty | Admitting: Unknown Physician Specialty

## 2015-12-16 ENCOUNTER — Encounter
Admission: RE | Disposition: A | Discharge status: Home / Self Care | Payer: Self-pay | Source: Ambulatory Visit | Attending: Unknown Physician Specialty

## 2015-12-16 DIAGNOSIS — Z8546 Personal history of malignant neoplasm of prostate: Secondary | ICD-10-CM | POA: Insufficient documentation

## 2015-12-16 DIAGNOSIS — E785 Hyperlipidemia, unspecified: Secondary | ICD-10-CM | POA: Insufficient documentation

## 2015-12-16 DIAGNOSIS — I252 Old myocardial infarction: Secondary | ICD-10-CM | POA: Diagnosis not present

## 2015-12-16 DIAGNOSIS — Z1211 Encounter for screening for malignant neoplasm of colon: Secondary | ICD-10-CM | POA: Diagnosis not present

## 2015-12-16 DIAGNOSIS — I129 Hypertensive chronic kidney disease with stage 1 through stage 4 chronic kidney disease, or unspecified chronic kidney disease: Secondary | ICD-10-CM | POA: Insufficient documentation

## 2015-12-16 DIAGNOSIS — I2510 Atherosclerotic heart disease of native coronary artery without angina pectoris: Secondary | ICD-10-CM | POA: Diagnosis not present

## 2015-12-16 DIAGNOSIS — D124 Benign neoplasm of descending colon: Secondary | ICD-10-CM | POA: Diagnosis not present

## 2015-12-16 DIAGNOSIS — D123 Benign neoplasm of transverse colon: Secondary | ICD-10-CM | POA: Diagnosis not present

## 2015-12-16 DIAGNOSIS — Z86010 Personal history of colonic polyps: Secondary | ICD-10-CM | POA: Insufficient documentation

## 2015-12-16 DIAGNOSIS — K640 First degree hemorrhoids: Secondary | ICD-10-CM | POA: Diagnosis not present

## 2015-12-16 DIAGNOSIS — Z7982 Long term (current) use of aspirin: Secondary | ICD-10-CM | POA: Diagnosis not present

## 2015-12-16 DIAGNOSIS — K635 Polyp of colon: Secondary | ICD-10-CM | POA: Diagnosis not present

## 2015-12-16 DIAGNOSIS — D649 Anemia, unspecified: Secondary | ICD-10-CM | POA: Diagnosis not present

## 2015-12-16 DIAGNOSIS — Z79899 Other long term (current) drug therapy: Secondary | ICD-10-CM | POA: Insufficient documentation

## 2015-12-16 DIAGNOSIS — E039 Hypothyroidism, unspecified: Secondary | ICD-10-CM | POA: Insufficient documentation

## 2015-12-16 DIAGNOSIS — D631 Anemia in chronic kidney disease: Secondary | ICD-10-CM | POA: Insufficient documentation

## 2015-12-16 DIAGNOSIS — N189 Chronic kidney disease, unspecified: Secondary | ICD-10-CM | POA: Diagnosis not present

## 2015-12-16 DIAGNOSIS — R42 Dizziness and giddiness: Secondary | ICD-10-CM | POA: Insufficient documentation

## 2015-12-16 DIAGNOSIS — K649 Unspecified hemorrhoids: Secondary | ICD-10-CM | POA: Diagnosis not present

## 2015-12-16 HISTORY — DX: Dizziness and giddiness: R42

## 2015-12-16 HISTORY — DX: Chronic kidney disease, unspecified: N18.9

## 2015-12-16 HISTORY — DX: Atherosclerotic heart disease of native coronary artery without angina pectoris: I25.10

## 2015-12-16 HISTORY — DX: Anemia, unspecified: D64.9

## 2015-12-16 HISTORY — DX: Hyperlipidemia, unspecified: E78.5

## 2015-12-16 HISTORY — DX: Male erectile dysfunction, unspecified: N52.9

## 2015-12-16 HISTORY — DX: Essential (primary) hypertension: I10

## 2015-12-16 HISTORY — DX: Hypothyroidism, unspecified: E03.9

## 2015-12-16 HISTORY — PX: COLONOSCOPY WITH PROPOFOL: SHX5780

## 2015-12-16 SURGERY — COLONOSCOPY WITH PROPOFOL
Anesthesia: General

## 2015-12-16 MED ORDER — EPHEDRINE SULFATE 50 MG/ML IJ SOLN
INTRAMUSCULAR | Status: DC | PRN
  Administered 2015-12-16 (×2): 5 mg via INTRAVENOUS
  Administered 2015-12-16: 10 mg via INTRAVENOUS
  Administered 2015-12-16 (×4): 5 mg via INTRAVENOUS

## 2015-12-16 MED ORDER — FENTANYL CITRATE (PF) 100 MCG/2ML IJ SOLN
INTRAMUSCULAR | Status: DC | PRN
  Administered 2015-12-16: 50 ug via INTRAVENOUS

## 2015-12-16 MED ORDER — SODIUM CHLORIDE 0.9 % IV SOLN
INTRAVENOUS | Status: DC
  Administered 2015-12-16: 09:00:00 via INTRAVENOUS

## 2015-12-16 MED ORDER — SODIUM CHLORIDE 0.9 % IV SOLN: INTRAVENOUS | Status: DC

## 2015-12-16 MED ORDER — PROPOFOL 500 MG/50ML IV EMUL
INTRAVENOUS | Status: DC | PRN
  Administered 2015-12-16: 50 ug/kg/min via INTRAVENOUS

## 2015-12-16 MED ORDER — LIDOCAINE HCL (PF) 2 % IJ SOLN
INTRAMUSCULAR | Status: DC | PRN
  Administered 2015-12-16: 60 mg

## 2015-12-16 MED ORDER — PROPOFOL 10 MG/ML IV BOLUS
INTRAVENOUS | Status: DC | PRN
  Administered 2015-12-16: 30 mg via INTRAVENOUS
  Administered 2015-12-16: 20 mg via INTRAVENOUS

## 2015-12-16 MED ORDER — ONDANSETRON HCL 4 MG/2ML IJ SOLN
INTRAMUSCULAR | Status: DC | PRN
  Administered 2015-12-16: 4 mg via INTRAVENOUS

## 2015-12-16 NOTE — H&P (Signed)
Primary Care Physician:  Idelle Crouch, MD Primary Gastroenterologist:  Dr. Vira Agar  Pre-Procedure History & Physical: HPI:  Daniel Lewis is a 81 y.o. male is here for an colonoscopy.   Past Medical History  Diagnosis Date  . MI (myocardial infarction) ([place])   . Prostate cancer ([place])   . Anemia   . ASHD (arteriosclerotic heart disease)   . Hyperlipidemia   . Hypertension   . Hypothyroidism   . Chronic kidney disease   . Vertigo   . Erectile dysfunction     Past Surgical History  Procedure Laterality Date  . Inguinal hernia repair    . Prostatectomy    . Colonoscopy      Prior to Admission medications   Medication Sig Start Date End Date Taking? Authorizing Provider  atenolol (TENORMIN) 50 MG tablet Take 1 tablet by mouth daily. 06/26/15  Yes Historical Provider, MD  atorvastatin (LIPITOR) 40 MG tablet Take 40 mg by mouth daily.   Yes Historical Provider, MD  SYNTHROID 75 MCG tablet Take 1 tablet by mouth every morning. 08/28/15  Yes Historical Provider, MD  TRAVATAN Z 0.004 % SOLN ophthalmic solution Place 1 drop into both eyes at bedtime. 08/21/15  Yes Historical Provider, MD  vitamin C (ASCORBIC ACID) 500 MG tablet Take 1 tablet by mouth daily.   Yes Historical Provider, MD  amoxicillin-clavulanate (AUGMENTIN) 875-125 MG tablet Take 1 tablet by mouth 2 (two) times daily. 11/09/15   Charline Bills Cuthriell, PA-C  aspirin EC 81 MG tablet Take 1 tablet by mouth daily.    Historical Provider, MD  ondansetron (ZOFRAN) 4 MG tablet Take 1 tablet (4 mg total) by mouth every 8 (eight) hours as needed for nausea or vomiting. 09/03/15   Daymon Larsen, MD  traMADol (ULTRAM) 50 MG tablet Take 1 tablet (50 mg total) by mouth every 6 (six) hours as needed. 09/03/15   Daymon Larsen, MD    Allergies as of 11/12/2015  . (No Known Allergies)    History reviewed. No pertinent family history.  Social History   Social History  . Marital Status: Married    Spouse Name: N/A  .  Number of Children: N/A  . Years of Education: N/A   Occupational History  . Not on file.   Social History Main Topics  . Smoking status: Never Smoker   . Smokeless tobacco: Never Used  . Alcohol Use: No  . Drug Use: No  . Sexual Activity: Not on file   Other Topics Concern  . Not on file   Social History Narrative    Review of Systems: See HPI, otherwise negative ROS  Physical Exam: BP 125/68 mmHg  Pulse 56  Temp(Src) 97.1 F (36.2 C) (Tympanic)  Resp 15  Ht 5\' 9"  (1.753 m)  Wt 63.504 kg (140 lb)  BMI 20.67 kg/m2  SpO2 98% General:   Alert,  pleasant and cooperative in NAD Head:  Normocephalic and atraumatic. Neck:  Supple; no masses or thyromegaly. Lungs:  Clear throughout to auscultation.    Heart:  Regular rate and rhythm. Abdomen:  Soft, nontender and nondistended. Normal bowel sounds, without guarding, and without rebound.   Neurologic:  Alert and  oriented x4;  grossly normal neurologically.  Impression/Plan: Daniel Lewis is here for an colonoscopy to be performed for screening colon  Risks, benefits, limitations, and alternatives regarding  colonoscopy have been reviewed with the patient.  Questions have been answered.  All parties agreeable.   ELLIOTT, ROBERT,  MD  12/16/2015, 9:24 AM

## 2015-12-16 NOTE — Transfer of Care (Signed)
Immediate Anesthesia Transfer of Care Note  Patient: Daniel Lewis  Procedure(s) Performed: Procedure(s): COLONOSCOPY WITH PROPOFOL (N/A)  Patient Location: PACU  Anesthesia Type:General  Level of Consciousness: sedated  Airway & Oxygen Therapy: Patient Spontanous Breathing and Patient connected to nasal cannula oxygen  Post-op Assessment: Report given to RN and Post -op Vital signs reviewed and stable  Post vital signs: Reviewed and stable  Last Vitals:  Filed Vitals:   12/16/15 0855  BP: 125/68  Pulse: 56  Temp: 36.2 C  Resp: 15    Complications: No apparent anesthesia complications

## 2015-12-16 NOTE — Anesthesia Preprocedure Evaluation (Addendum)
Anesthesia Evaluation  Patient identified by MRN, date of birth, ID band Patient awake    Reviewed: Allergy & Precautions, NPO status , Patient's Chart, lab work & pertinent test results, reviewed documented beta blocker date and time   Airway Mallampati: III  TM Distance: <3 FB     Dental  (+) Caps, Chipped   Pulmonary neg pulmonary ROS,    Pulmonary exam normal        Cardiovascular hypertension, Pt. on medications and Pt. on home beta blockers + CAD and + Past MI  Normal cardiovascular exam     Neuro/Psych vertigo negative neurological ROS  negative psych ROS   GI/Hepatic Neg liver ROS,   Endo/Other  Hypothyroidism   Renal/GU Renal InsufficiencyRenal disease     Musculoskeletal   Abdominal Normal abdominal exam  (+)   Peds  Hematology  (+) anemia ,   Anesthesia Other Findings   Reproductive/Obstetrics                            Anesthesia Physical Anesthesia Plan  ASA: III  Anesthesia Plan: General   Post-op Pain Management:    Induction: Intravenous  Airway Management Planned: Nasal Cannula  Additional Equipment:   Intra-op Plan:   Post-operative Plan:   Informed Consent: I have reviewed the patients History and Physical, chart, labs and discussed the procedure including the risks, benefits and alternatives for the proposed anesthesia with the patient or authorized representative who has indicated his/her understanding and acceptance.   Dental advisory given  Plan Discussed with: CRNA and Surgeon  Anesthesia Plan Comments:         Anesthesia Quick Evaluation

## 2015-12-16 NOTE — Op Note (Signed)
[facility] Patient Name: Daniel Lewis Procedure Date: 12/16/2015 8:47 AM MRN: [id number] Account #: [id number] Date of Birth: Aug 18, 1935 Admit Type: Outpatient Age: 81 Room: [facility] ENDO ROOM 1 Gender: Male Note Status: Finalized Procedure:         Colonoscopy Indications:       High risk colon cancer surveillance: Personal history of                     colonic polyps Providers:         Manya Silvas, MD Referring MD:      Leonie Douglas. Doy Hutching, MD (Referring MD) Medicines:         Propofol per Anesthesia Complications:     No immediate complications. Procedure:         Pre-Anesthesia Assessment:                    - After reviewing the risks and benefits, the patient was                     deemed in satisfactory condition to undergo the procedure.                    After obtaining informed consent, the colonoscope was                     passed under direct vision. Throughout the procedure, the                     patient's blood pressure, pulse, and oxygen saturations                     were monitored continuously. The Colonoscope was                     introduced through the anus and advanced to the the cecum,                     identified by appendiceal orifice and ileocecal valve. The                     colonoscopy was performed without difficulty. The patient                     tolerated the procedure well. The quality of the bowel                     preparation was good. Findings:      A small polyp was found in the transverse colon. The polyp was sessile.       The polyp was removed with a hot snare. Resection and retrieval were       complete. To prevent bleeding after the polypectomy, one hemostatic clip       was successfully placed. There was no bleeding during, and at the end,       of the procedure.      A diminutive polyp was found in the descending colon. The polyp was       sessile. The polyp was removed with a cold  snare. Resection was       complete, but the polyp tissue was not retrieved.      Two sessile polyps were found in the transverse colon. The polyps were       diminutive in size. These polyps  were removed with a cold biopsy       forceps. Resection and retrieval were complete.      Internal hemorrhoids were found during endoscopy. The hemorrhoids were       small and Grade I (internal hemorrhoids that do not prolapse).      The exam was otherwise without abnormality. Impression:        - One small polyp in the transverse colon. Resected and                     retrieved. Clip was placed.                    - One diminutive polyp in the descending colon. Complete                     resection. Polyp tissue not retrieved.                    - Two diminutive polyps in the transverse colon. Resected                     and retrieved.                    - Internal hemorrhoids.                    - The examination was otherwise normal. Recommendation:    - Await pathology results. Manya Silvas, MD 12/16/2015 9:55:21 AM This report has been signed electronically. Number of Addenda: 0 Note Initiated On: 12/16/2015 8:47 AM Scope Withdrawal Time: 0 hours 8 minutes 37 seconds  Total Procedure Duration: 0 hours 20 minutes 58 seconds       [facility]

## 2015-12-16 NOTE — Anesthesia Postprocedure Evaluation (Signed)
Anesthesia Post Note  Patient: Daniel Lewis  Procedure(s) Performed: Procedure(s) (LRB): COLONOSCOPY WITH PROPOFOL (N/A)  Patient location during evaluation: PACU Anesthesia Type: General Level of consciousness: awake and alert and oriented Pain management: pain level controlled Vital Signs Assessment: post-procedure vital signs reviewed and stable Respiratory status: spontaneous breathing Cardiovascular status: blood pressure returned to baseline Anesthetic complications: no    Last Vitals:  Filed Vitals:   12/16/15 1020 12/16/15 1028  BP:  134/59  Pulse: 53 52  Temp:    Resp: 13 13    Last Pain: There were no vitals filed for this visit.               Gracen Ringwald

## 2015-12-17 LAB — SURGICAL PATHOLOGY

## 2015-12-18 ENCOUNTER — Encounter: Payer: Self-pay | Admitting: Unknown Physician Specialty

## 2016-03-16 DIAGNOSIS — L821 Other seborrheic keratosis: Secondary | ICD-10-CM | POA: Diagnosis not present

## 2016-03-16 DIAGNOSIS — L570 Actinic keratosis: Secondary | ICD-10-CM | POA: Diagnosis not present

## 2016-03-16 DIAGNOSIS — L719 Rosacea, unspecified: Secondary | ICD-10-CM | POA: Diagnosis not present

## 2016-03-16 DIAGNOSIS — Z8546 Personal history of malignant neoplasm of prostate: Secondary | ICD-10-CM | POA: Diagnosis not present

## 2016-03-16 DIAGNOSIS — L578 Other skin changes due to chronic exposure to nonionizing radiation: Secondary | ICD-10-CM | POA: Diagnosis not present

## 2016-03-16 DIAGNOSIS — L812 Freckles: Secondary | ICD-10-CM | POA: Diagnosis not present

## 2016-03-26 DIAGNOSIS — H6123 Impacted cerumen, bilateral: Secondary | ICD-10-CM | POA: Diagnosis not present

## 2016-03-26 DIAGNOSIS — H903 Sensorineural hearing loss, bilateral: Secondary | ICD-10-CM | POA: Diagnosis not present

## 2016-04-13 DIAGNOSIS — E039 Hypothyroidism, unspecified: Secondary | ICD-10-CM | POA: Diagnosis not present

## 2016-04-13 DIAGNOSIS — Z125 Encounter for screening for malignant neoplasm of prostate: Secondary | ICD-10-CM | POA: Diagnosis not present

## 2016-04-13 DIAGNOSIS — C61 Malignant neoplasm of prostate: Secondary | ICD-10-CM | POA: Diagnosis not present

## 2016-04-13 DIAGNOSIS — R7309 Other abnormal glucose: Secondary | ICD-10-CM | POA: Diagnosis not present

## 2016-04-13 DIAGNOSIS — I2510 Atherosclerotic heart disease of native coronary artery without angina pectoris: Secondary | ICD-10-CM | POA: Diagnosis not present

## 2016-04-13 DIAGNOSIS — M25511 Pain in right shoulder: Secondary | ICD-10-CM | POA: Diagnosis not present

## 2016-04-13 DIAGNOSIS — Z0000 Encounter for general adult medical examination without abnormal findings: Secondary | ICD-10-CM | POA: Diagnosis not present

## 2016-04-13 DIAGNOSIS — I10 Essential (primary) hypertension: Secondary | ICD-10-CM | POA: Diagnosis not present

## 2016-04-13 DIAGNOSIS — Z79899 Other long term (current) drug therapy: Secondary | ICD-10-CM | POA: Diagnosis not present

## 2016-04-13 DIAGNOSIS — E7800 Pure hypercholesterolemia, unspecified: Secondary | ICD-10-CM | POA: Diagnosis not present

## 2016-04-13 DIAGNOSIS — D649 Anemia, unspecified: Secondary | ICD-10-CM | POA: Diagnosis not present

## 2016-04-13 DIAGNOSIS — M79601 Pain in right arm: Secondary | ICD-10-CM | POA: Diagnosis not present

## 2016-09-21 DIAGNOSIS — L578 Other skin changes due to chronic exposure to nonionizing radiation: Secondary | ICD-10-CM | POA: Diagnosis not present

## 2016-09-21 DIAGNOSIS — L820 Inflamed seborrheic keratosis: Secondary | ICD-10-CM | POA: Diagnosis not present

## 2016-09-21 DIAGNOSIS — L570 Actinic keratosis: Secondary | ICD-10-CM | POA: Diagnosis not present

## 2016-09-21 DIAGNOSIS — L821 Other seborrheic keratosis: Secondary | ICD-10-CM | POA: Diagnosis not present

## 2016-09-21 DIAGNOSIS — D692 Other nonthrombocytopenic purpura: Secondary | ICD-10-CM | POA: Diagnosis not present

## 2016-10-21 DIAGNOSIS — E039 Hypothyroidism, unspecified: Secondary | ICD-10-CM | POA: Diagnosis not present

## 2016-10-21 DIAGNOSIS — Z0000 Encounter for general adult medical examination without abnormal findings: Secondary | ICD-10-CM | POA: Diagnosis not present

## 2016-10-21 DIAGNOSIS — E7800 Pure hypercholesterolemia, unspecified: Secondary | ICD-10-CM | POA: Diagnosis not present

## 2016-10-21 DIAGNOSIS — I10 Essential (primary) hypertension: Secondary | ICD-10-CM | POA: Diagnosis not present

## 2016-10-21 DIAGNOSIS — Z79899 Other long term (current) drug therapy: Secondary | ICD-10-CM | POA: Diagnosis not present

## 2016-10-21 DIAGNOSIS — R7309 Other abnormal glucose: Secondary | ICD-10-CM | POA: Diagnosis not present

## 2016-12-21 DIAGNOSIS — H401132 Primary open-angle glaucoma, bilateral, moderate stage: Secondary | ICD-10-CM | POA: Diagnosis not present

## 2016-12-21 DIAGNOSIS — H2513 Age-related nuclear cataract, bilateral: Secondary | ICD-10-CM | POA: Diagnosis not present

## 2017-03-24 DIAGNOSIS — L821 Other seborrheic keratosis: Secondary | ICD-10-CM | POA: Diagnosis not present

## 2017-03-24 DIAGNOSIS — L578 Other skin changes due to chronic exposure to nonionizing radiation: Secondary | ICD-10-CM | POA: Diagnosis not present

## 2017-03-24 DIAGNOSIS — L820 Inflamed seborrheic keratosis: Secondary | ICD-10-CM | POA: Diagnosis not present

## 2017-03-24 DIAGNOSIS — L570 Actinic keratosis: Secondary | ICD-10-CM | POA: Diagnosis not present

## 2017-04-14 DIAGNOSIS — I10 Essential (primary) hypertension: Secondary | ICD-10-CM | POA: Diagnosis not present

## 2017-04-14 DIAGNOSIS — Z125 Encounter for screening for malignant neoplasm of prostate: Secondary | ICD-10-CM | POA: Diagnosis not present

## 2017-04-14 DIAGNOSIS — R7309 Other abnormal glucose: Secondary | ICD-10-CM | POA: Diagnosis not present

## 2017-04-14 DIAGNOSIS — Z0000 Encounter for general adult medical examination without abnormal findings: Secondary | ICD-10-CM | POA: Diagnosis not present

## 2017-04-14 DIAGNOSIS — Z79899 Other long term (current) drug therapy: Secondary | ICD-10-CM | POA: Diagnosis not present

## 2017-04-14 DIAGNOSIS — R05 Cough: Secondary | ICD-10-CM | POA: Diagnosis not present

## 2017-04-14 DIAGNOSIS — E7800 Pure hypercholesterolemia, unspecified: Secondary | ICD-10-CM | POA: Diagnosis not present

## 2017-04-14 DIAGNOSIS — E039 Hypothyroidism, unspecified: Secondary | ICD-10-CM | POA: Diagnosis not present

## 2017-04-26 DIAGNOSIS — H401232 Low-tension glaucoma, bilateral, moderate stage: Secondary | ICD-10-CM | POA: Diagnosis not present

## 2017-08-16 DIAGNOSIS — L570 Actinic keratosis: Secondary | ICD-10-CM | POA: Diagnosis not present

## 2017-08-16 DIAGNOSIS — D692 Other nonthrombocytopenic purpura: Secondary | ICD-10-CM | POA: Diagnosis not present

## 2017-08-16 DIAGNOSIS — L812 Freckles: Secondary | ICD-10-CM | POA: Diagnosis not present

## 2017-08-16 DIAGNOSIS — L821 Other seborrheic keratosis: Secondary | ICD-10-CM | POA: Diagnosis not present

## 2017-08-16 DIAGNOSIS — L578 Other skin changes due to chronic exposure to nonionizing radiation: Secondary | ICD-10-CM | POA: Diagnosis not present

## 2017-08-30 DIAGNOSIS — H401131 Primary open-angle glaucoma, bilateral, mild stage: Secondary | ICD-10-CM | POA: Diagnosis not present

## 2017-08-30 DIAGNOSIS — H2513 Age-related nuclear cataract, bilateral: Secondary | ICD-10-CM | POA: Diagnosis not present

## 2017-10-19 DIAGNOSIS — I10 Essential (primary) hypertension: Secondary | ICD-10-CM | POA: Diagnosis not present

## 2017-10-19 DIAGNOSIS — E039 Hypothyroidism, unspecified: Secondary | ICD-10-CM | POA: Diagnosis not present

## 2017-10-19 DIAGNOSIS — E7800 Pure hypercholesterolemia, unspecified: Secondary | ICD-10-CM | POA: Diagnosis not present

## 2017-10-19 DIAGNOSIS — Z79899 Other long term (current) drug therapy: Secondary | ICD-10-CM | POA: Diagnosis not present

## 2017-10-19 DIAGNOSIS — R7309 Other abnormal glucose: Secondary | ICD-10-CM | POA: Diagnosis not present

## 2017-11-29 DIAGNOSIS — I10 Essential (primary) hypertension: Secondary | ICD-10-CM | POA: Diagnosis not present

## 2017-12-15 DIAGNOSIS — H903 Sensorineural hearing loss, bilateral: Secondary | ICD-10-CM | POA: Diagnosis not present

## 2017-12-15 DIAGNOSIS — H6123 Impacted cerumen, bilateral: Secondary | ICD-10-CM | POA: Diagnosis not present

## 2017-12-17 DIAGNOSIS — M25512 Pain in left shoulder: Secondary | ICD-10-CM | POA: Diagnosis not present

## 2017-12-17 DIAGNOSIS — I10 Essential (primary) hypertension: Secondary | ICD-10-CM | POA: Diagnosis not present

## 2017-12-17 DIAGNOSIS — M79622 Pain in left upper arm: Secondary | ICD-10-CM | POA: Diagnosis not present

## 2018-01-03 DIAGNOSIS — H401131 Primary open-angle glaucoma, bilateral, mild stage: Secondary | ICD-10-CM | POA: Diagnosis not present

## 2018-01-04 DIAGNOSIS — M25512 Pain in left shoulder: Secondary | ICD-10-CM | POA: Insufficient documentation

## 2018-02-24 DIAGNOSIS — L812 Freckles: Secondary | ICD-10-CM | POA: Diagnosis not present

## 2018-02-24 DIAGNOSIS — L578 Other skin changes due to chronic exposure to nonionizing radiation: Secondary | ICD-10-CM | POA: Diagnosis not present

## 2018-02-24 DIAGNOSIS — L820 Inflamed seborrheic keratosis: Secondary | ICD-10-CM | POA: Diagnosis not present

## 2018-02-24 DIAGNOSIS — L821 Other seborrheic keratosis: Secondary | ICD-10-CM | POA: Diagnosis not present

## 2018-02-24 DIAGNOSIS — L570 Actinic keratosis: Secondary | ICD-10-CM | POA: Diagnosis not present

## 2018-04-27 DIAGNOSIS — I10 Essential (primary) hypertension: Secondary | ICD-10-CM | POA: Diagnosis not present

## 2018-04-27 DIAGNOSIS — E039 Hypothyroidism, unspecified: Secondary | ICD-10-CM | POA: Diagnosis not present

## 2018-04-27 DIAGNOSIS — Z0000 Encounter for general adult medical examination without abnormal findings: Secondary | ICD-10-CM | POA: Diagnosis not present

## 2018-04-27 DIAGNOSIS — E7800 Pure hypercholesterolemia, unspecified: Secondary | ICD-10-CM | POA: Diagnosis not present

## 2018-04-27 DIAGNOSIS — Z79899 Other long term (current) drug therapy: Secondary | ICD-10-CM | POA: Diagnosis not present

## 2018-04-27 DIAGNOSIS — R7309 Other abnormal glucose: Secondary | ICD-10-CM | POA: Diagnosis not present

## 2018-04-27 DIAGNOSIS — J449 Chronic obstructive pulmonary disease, unspecified: Secondary | ICD-10-CM | POA: Diagnosis not present

## 2018-04-27 DIAGNOSIS — Z125 Encounter for screening for malignant neoplasm of prostate: Secondary | ICD-10-CM | POA: Diagnosis not present

## 2018-05-09 DIAGNOSIS — H401231 Low-tension glaucoma, bilateral, mild stage: Secondary | ICD-10-CM | POA: Diagnosis not present

## 2018-06-06 DIAGNOSIS — M79644 Pain in right finger(s): Secondary | ICD-10-CM | POA: Diagnosis not present

## 2018-06-06 DIAGNOSIS — M65341 Trigger finger, right ring finger: Secondary | ICD-10-CM | POA: Diagnosis not present

## 2018-06-15 DIAGNOSIS — H6123 Impacted cerumen, bilateral: Secondary | ICD-10-CM | POA: Diagnosis not present

## 2018-06-15 DIAGNOSIS — H903 Sensorineural hearing loss, bilateral: Secondary | ICD-10-CM | POA: Diagnosis not present

## 2018-07-13 DIAGNOSIS — H903 Sensorineural hearing loss, bilateral: Secondary | ICD-10-CM | POA: Diagnosis not present

## 2018-07-28 DIAGNOSIS — H903 Sensorineural hearing loss, bilateral: Secondary | ICD-10-CM | POA: Diagnosis not present

## 2018-08-09 DIAGNOSIS — K045 Chronic apical periodontitis: Secondary | ICD-10-CM | POA: Diagnosis not present

## 2018-09-05 DIAGNOSIS — L578 Other skin changes due to chronic exposure to nonionizing radiation: Secondary | ICD-10-CM | POA: Diagnosis not present

## 2018-09-05 DIAGNOSIS — H401231 Low-tension glaucoma, bilateral, mild stage: Secondary | ICD-10-CM | POA: Diagnosis not present

## 2018-09-05 DIAGNOSIS — L570 Actinic keratosis: Secondary | ICD-10-CM | POA: Diagnosis not present

## 2018-10-28 DIAGNOSIS — Z8546 Personal history of malignant neoplasm of prostate: Secondary | ICD-10-CM | POA: Diagnosis not present

## 2018-10-28 DIAGNOSIS — R7309 Other abnormal glucose: Secondary | ICD-10-CM | POA: Diagnosis not present

## 2018-10-28 DIAGNOSIS — C61 Malignant neoplasm of prostate: Secondary | ICD-10-CM | POA: Diagnosis not present

## 2018-10-28 DIAGNOSIS — Z79899 Other long term (current) drug therapy: Secondary | ICD-10-CM | POA: Diagnosis not present

## 2018-10-28 DIAGNOSIS — E7800 Pure hypercholesterolemia, unspecified: Secondary | ICD-10-CM | POA: Diagnosis not present

## 2018-10-28 DIAGNOSIS — E039 Hypothyroidism, unspecified: Secondary | ICD-10-CM | POA: Diagnosis not present

## 2018-10-28 DIAGNOSIS — I10 Essential (primary) hypertension: Secondary | ICD-10-CM | POA: Diagnosis not present

## 2018-11-28 ENCOUNTER — Encounter: Payer: Self-pay | Admitting: Emergency Medicine

## 2018-11-28 ENCOUNTER — Inpatient Hospital Stay
Admission: EM | Admit: 2018-11-28 | Discharge: 2018-12-08 | DRG: 335 | Disposition: A | Discharge status: Home / Self Care | Payer: PPO | Attending: Internal Medicine | Admitting: Internal Medicine

## 2018-11-28 ENCOUNTER — Other Ambulatory Visit: Payer: Self-pay

## 2018-11-28 ENCOUNTER — Emergency Department: Payer: PPO

## 2018-11-28 DIAGNOSIS — Z9049 Acquired absence of other specified parts of digestive tract: Secondary | ICD-10-CM | POA: Diagnosis not present

## 2018-11-28 DIAGNOSIS — I252 Old myocardial infarction: Secondary | ICD-10-CM

## 2018-11-28 DIAGNOSIS — Z7982 Long term (current) use of aspirin: Secondary | ICD-10-CM | POA: Diagnosis not present

## 2018-11-28 DIAGNOSIS — N179 Acute kidney failure, unspecified: Secondary | ICD-10-CM | POA: Diagnosis not present

## 2018-11-28 DIAGNOSIS — K567 Ileus, unspecified: Secondary | ICD-10-CM | POA: Diagnosis not present

## 2018-11-28 DIAGNOSIS — E039 Hypothyroidism, unspecified: Secondary | ICD-10-CM | POA: Diagnosis not present

## 2018-11-28 DIAGNOSIS — Z79899 Other long term (current) drug therapy: Secondary | ICD-10-CM

## 2018-11-28 DIAGNOSIS — I2510 Atherosclerotic heart disease of native coronary artery without angina pectoris: Secondary | ICD-10-CM | POA: Diagnosis present

## 2018-11-28 DIAGNOSIS — Z9079 Acquired absence of other genital organ(s): Secondary | ICD-10-CM

## 2018-11-28 DIAGNOSIS — E43 Unspecified severe protein-calorie malnutrition: Secondary | ICD-10-CM

## 2018-11-28 DIAGNOSIS — Z7951 Long term (current) use of inhaled steroids: Secondary | ICD-10-CM | POA: Diagnosis not present

## 2018-11-28 DIAGNOSIS — E785 Hyperlipidemia, unspecified: Secondary | ICD-10-CM | POA: Diagnosis not present

## 2018-11-28 DIAGNOSIS — K4090 Unilateral inguinal hernia, without obstruction or gangrene, not specified as recurrent: Secondary | ICD-10-CM | POA: Diagnosis not present

## 2018-11-28 DIAGNOSIS — Z681 Body mass index (BMI) 19 or less, adult: Secondary | ICD-10-CM | POA: Diagnosis not present

## 2018-11-28 DIAGNOSIS — E876 Hypokalemia: Secondary | ICD-10-CM | POA: Diagnosis not present

## 2018-11-28 DIAGNOSIS — K9189 Other postprocedural complications and disorders of digestive system: Secondary | ICD-10-CM | POA: Diagnosis present

## 2018-11-28 DIAGNOSIS — K5651 Intestinal adhesions [bands], with partial obstruction: Principal | ICD-10-CM | POA: Diagnosis present

## 2018-11-28 DIAGNOSIS — Z8546 Personal history of malignant neoplasm of prostate: Secondary | ICD-10-CM

## 2018-11-28 DIAGNOSIS — K56609 Unspecified intestinal obstruction, unspecified as to partial versus complete obstruction: Secondary | ICD-10-CM | POA: Diagnosis not present

## 2018-11-28 DIAGNOSIS — I129 Hypertensive chronic kidney disease with stage 1 through stage 4 chronic kidney disease, or unspecified chronic kidney disease: Secondary | ICD-10-CM | POA: Diagnosis not present

## 2018-11-28 DIAGNOSIS — N529 Male erectile dysfunction, unspecified: Secondary | ICD-10-CM | POA: Diagnosis present

## 2018-11-28 DIAGNOSIS — I10 Essential (primary) hypertension: Secondary | ICD-10-CM | POA: Diagnosis not present

## 2018-11-28 DIAGNOSIS — N183 Chronic kidney disease, stage 3 (moderate): Secondary | ICD-10-CM | POA: Diagnosis not present

## 2018-11-28 DIAGNOSIS — N189 Chronic kidney disease, unspecified: Secondary | ICD-10-CM | POA: Diagnosis not present

## 2018-11-28 DIAGNOSIS — R112 Nausea with vomiting, unspecified: Secondary | ICD-10-CM | POA: Diagnosis not present

## 2018-11-28 LAB — COMPREHENSIVE METABOLIC PANEL
ALBUMIN: 4.1 g/dL (ref 3.5–5.0)
ALT: 44 U/L (ref 0–44)
AST: 42 U/L — ABNORMAL HIGH (ref 15–41)
Alkaline Phosphatase: 73 U/L (ref 38–126)
Anion gap: 14 (ref 5–15)
BUN: 78 mg/dL — ABNORMAL HIGH (ref 8–23)
CO2: 23 mmol/L (ref 22–32)
Calcium: 9.3 mg/dL (ref 8.9–10.3)
Chloride: 96 mmol/L — ABNORMAL LOW (ref 98–111)
Creatinine, Ser: 2.39 mg/dL — ABNORMAL HIGH (ref 0.61–1.24)
GFR calc Af Amer: 28 mL/min — ABNORMAL LOW (ref >60)
GFR calc non Af Amer: 25 mL/min — ABNORMAL LOW (ref >60)
Glucose, Bld: 204 mg/dL — ABNORMAL HIGH (ref 70–99)
POTASSIUM: 5 mmol/L (ref 3.5–5.1)
Sodium: 133 mmol/L — ABNORMAL LOW (ref 135–145)
Total Bilirubin: 1.4 mg/dL — ABNORMAL HIGH (ref 0.3–1.2)
Total Protein: 7.6 g/dL (ref 6.5–8.1)

## 2018-11-28 LAB — POCT I-STAT, CHEM 8
BUN: 81 mg/dL — ABNORMAL HIGH (ref 8–23)
Calcium, Ion: 1.09 mmol/L — ABNORMAL LOW (ref 1.15–1.40)
Chloride: 100 mmol/L (ref 98–111)
Creatinine, Ser: 2.2 mg/dL — ABNORMAL HIGH (ref 0.61–1.24)
Glucose, Bld: 150 mg/dL — ABNORMAL HIGH (ref 70–99)
HCT: 43 % (ref 39.0–52.0)
Hemoglobin: 14.6 g/dL (ref 13.0–17.0)
Potassium: 4.9 mmol/L (ref 3.5–5.1)
Sodium: 135 mmol/L (ref 135–145)
TCO2: 28 mmol/L (ref 22–32)

## 2018-11-28 LAB — CBC
HCT: 45 % (ref 39.0–52.0)
HEMOGLOBIN: 15.4 g/dL (ref 13.0–17.0)
MCH: 31.9 pg (ref 26.0–34.0)
MCHC: 34.2 g/dL (ref 30.0–36.0)
MCV: 93.2 fL (ref 80.0–100.0)
Platelets: 250 10*3/uL (ref 150–400)
RBC: 4.83 MIL/uL (ref 4.22–5.81)
RDW: 12.5 % (ref 11.5–15.5)
WBC: 11.7 10*3/uL — ABNORMAL HIGH (ref 4.0–10.5)
nRBC: 0 % (ref 0.0–0.2)

## 2018-11-28 LAB — URINALYSIS, COMPLETE (UACMP) WITH MICROSCOPIC
Bilirubin Urine: NEGATIVE
Glucose, UA: NEGATIVE mg/dL
Hgb urine dipstick: NEGATIVE
Ketones, ur: NEGATIVE mg/dL
Leukocytes, UA: NEGATIVE
Nitrite: NEGATIVE
Protein, ur: 100 mg/dL — AB
Specific Gravity, Urine: 1.023 (ref 1.005–1.030)
pH: 5 (ref 5.0–8.0)

## 2018-11-28 LAB — LIPASE, BLOOD: Lipase: 31 U/L (ref 11–51)

## 2018-11-28 LAB — LACTIC ACID, PLASMA: Lactic Acid, Venous: 1.5 mmol/L (ref 0.5–1.9)

## 2018-11-28 MED ORDER — MORPHINE SULFATE (PF) 2 MG/ML IV SOLN
2.0000 mg | INTRAVENOUS | Status: DC | PRN
  Administered 2018-12-02 – 2018-12-03 (×4): 2 mg via INTRAVENOUS
  Filled 2018-11-28 (×4): qty 1

## 2018-11-28 MED ORDER — ASPIRIN EC 81 MG PO TBEC
81.0000 mg | DELAYED_RELEASE_TABLET | Freq: Every day | ORAL | Status: DC
  Administered 2018-12-04 – 2018-12-08 (×5): 81 mg via ORAL
  Filled 2018-11-28 (×6): qty 1

## 2018-11-28 MED ORDER — SODIUM CHLORIDE 0.9 % IV SOLN
INTRAVENOUS | Status: AC
  Administered 2018-11-28 – 2018-12-05 (×13): via INTRAVENOUS

## 2018-11-28 MED ORDER — SODIUM CHLORIDE 0.9 % IV SOLN
1000.0000 mL | Freq: Once | INTRAVENOUS | Status: AC
  Administered 2018-11-28: 1000 mL via INTRAVENOUS

## 2018-11-28 MED ORDER — VITAMIN C 500 MG PO TABS: 500.0000 mg | ORAL_TABLET | Freq: Every day | ORAL | Status: DC

## 2018-11-28 MED ORDER — ONDANSETRON HCL 4 MG/2ML IJ SOLN
4.0000 mg | Freq: Once | INTRAMUSCULAR | Status: AC
  Administered 2018-11-28: 4 mg via INTRAVENOUS
  Filled 2018-11-28: qty 2

## 2018-11-28 MED ORDER — LEVOTHYROXINE SODIUM 50 MCG PO TABS
75.0000 ug | ORAL_TABLET | Freq: Every day | ORAL | Status: DC
  Administered 2018-12-03 – 2018-12-08 (×6): 75 ug via ORAL
  Filled 2018-11-28 (×2): qty 2
  Filled 2018-11-28 (×2): qty 1
  Filled 2018-11-28: qty 2
  Filled 2018-11-28 (×2): qty 1

## 2018-11-28 MED ORDER — AMOXICILLIN-POT CLAVULANATE 500-125 MG PO TABS
1.0000 | ORAL_TABLET | Freq: Two times a day (BID) | ORAL | Status: DC
  Filled 2018-11-28 (×2): qty 1

## 2018-11-28 MED ORDER — TRAZODONE HCL 50 MG PO TABS
25.0000 mg | ORAL_TABLET | Freq: Every evening | ORAL | Status: DC | PRN
  Filled 2018-11-28: qty 1
  Filled 2018-11-28: qty 0.5

## 2018-11-28 MED ORDER — ATORVASTATIN CALCIUM 20 MG PO TABS
40.0000 mg | ORAL_TABLET | Freq: Every day | ORAL | Status: DC
  Filled 2018-11-28 (×2): qty 2

## 2018-11-28 MED ORDER — PANTOPRAZOLE SODIUM 40 MG IV SOLR
40.0000 mg | Freq: Two times a day (BID) | INTRAVENOUS | Status: DC
  Administered 2018-11-28 – 2018-12-08 (×20): 40 mg via INTRAVENOUS
  Filled 2018-11-28 (×20): qty 40

## 2018-11-28 MED ORDER — ONDANSETRON HCL 4 MG PO TABS: 4.0000 mg | ORAL_TABLET | Freq: Three times a day (TID) | ORAL | Status: DC | PRN

## 2018-11-28 MED ORDER — ENOXAPARIN SODIUM 30 MG/0.3ML ~~LOC~~ SOLN
30.0000 mg | SUBCUTANEOUS | Status: DC
  Administered 2018-11-29 – 2018-11-30 (×2): 30 mg via SUBCUTANEOUS
  Filled 2018-11-28 (×2): qty 0.3

## 2018-11-28 MED ORDER — ONDANSETRON HCL 4 MG/2ML IJ SOLN
4.0000 mg | Freq: Four times a day (QID) | INTRAMUSCULAR | Status: DC | PRN
  Administered 2018-12-02: 4 mg via INTRAVENOUS
  Filled 2018-11-28: qty 2

## 2018-11-28 MED ORDER — PIPERACILLIN-TAZOBACTAM 3.375 G IVPB
3.3750 g | Freq: Three times a day (TID) | INTRAVENOUS | Status: DC
  Administered 2018-11-28 – 2018-12-01 (×9): 3.375 g via INTRAVENOUS
  Filled 2018-11-28 (×12): qty 50

## 2018-11-28 MED ORDER — ATENOLOL 50 MG PO TABS
50.0000 mg | ORAL_TABLET | Freq: Every day | ORAL | Status: DC
  Administered 2018-12-04 – 2018-12-08 (×5): 50 mg via ORAL
  Filled 2018-11-28 (×10): qty 1

## 2018-11-28 MED ORDER — ONDANSETRON HCL 4 MG PO TABS: 4.0000 mg | ORAL_TABLET | Freq: Four times a day (QID) | ORAL | Status: DC | PRN

## 2018-11-28 NOTE — ED Triage Notes (Signed)
Says stomach virus since Friday.  Says cant keep fluids down, vomiting. Denies diarrhea.  Has had fever.  Also stomach pain.

## 2018-11-28 NOTE — ED Notes (Signed)
Pt reports that he is still nauseous and that he has not been able to keep anything down since Friday

## 2018-11-28 NOTE — Progress Notes (Signed)
PHARMACY NOTE:  ANTIMICROBIAL RENAL DOSAGE ADJUSTMENT  Current antimicrobial regimen includes a mismatch between antimicrobial dosage and estimated renal function.  As per policy approved by the Pharmacy & Therapeutics and Medical Executive Committees, the antimicrobial dosage will be adjusted accordingly.  Current antimicrobial dosage:  Augmentin 875 mg PO BID   Indication:   Renal Function:  Estimated Creatinine Clearance: 23.7 mL/min (A) (by C-G formula based on SCr of 2.2 mg/dL (H)). []      On intermittent HD, scheduled: []      On CRRT    Antimicrobial dosage has been changed to:  Augmentin 500 mg PO BID  Additional comments:   Thank you for allowing pharmacy to be a part of this patient's care.  [place], [facility] 11/28/2018 10:28 PM

## 2018-11-28 NOTE — Progress Notes (Signed)
Anticoagulation monitoring(Lovenox):   84 yo male ordered Lovenox 40 mg Q24h  Filed Weights   11/28/18 1336  Weight: 140 lb (63.5 kg)   BMI    Lab Results  Component Value Date   CREATININE 2.20 (H) 11/28/2018   CREATININE 2.39 (H) 11/28/2018   CREATININE 1.21 09/03/2015   Estimated Creatinine Clearance: 23.7 mL/min (A) (by C-G formula based on SCr of 2.2 mg/dL (H)). Hemoglobin & Hematocrit     Component Value Date/Time   HGB 14.6 11/28/2018 1718   HCT 43.0 11/28/2018 1718     Per Protocol for Patient with estCrcl < 30 ml/min and BMI < 40, will transition to Lovenox 30 mg Q24h.

## 2018-11-28 NOTE — Progress Notes (Signed)
Pharmacy Antibiotic Note  Daniel Lewis is a 84 y.o. male admitted on 11/28/2018 with intra abdominal.  Pharmacy has been consulted for Zosyn dosing.  Plan: Zosyn 3.375g IV q8h (4 hour infusion).  Height: 5\' 9"  (175.3 cm) Weight: 140 lb (63.5 kg) IBW/kg (Calculated) : 70.7  Temp (24hrs), Avg:98.2 F (36.8 C), Min:98.2 F (36.8 C), Max:98.2 F (36.8 C)  Recent Labs  Lab 11/28/18 1343 11/28/18 1718  WBC 11.7*  --   CREATININE 2.39* 2.20*    Estimated Creatinine Clearance: 23.7 mL/min (A) (by C-G formula based on SCr of 2.2 mg/dL (H)).    No Known Allergies  Antimicrobials this admission:   >>    >>   Dose adjustments this admission:   Microbiology results:  BCx:   UCx:    Sputum:    MRSA PCR:   Thank you for allowing pharmacy to be a part of this patient's care.  Dhara Schepp D 11/28/2018 9:01 PM

## 2018-11-28 NOTE — ED Provider Notes (Signed)
[facility] Emergency Department Provider Note   ____________________________________________    I have reviewed the triage vital signs and the nursing notes.   HISTORY  Chief Complaint Emesis and Abdominal Pain     HPI IZEAH VOSSLER is a 84 y.o. male who presents with complaints of nausea vomiting and abdominal pain.  Patient describes mild bloating, nausea and vomiting which started yesterday.  He reports feeling somewhat better today.  He has not taken anything for this.  He is never had any like this before.  Is not sure if he has had fevers or chills.  Denies diarrhea, reports passing gas.  Past Medical History:  Diagnosis Date  . Anemia   . ASHD (arteriosclerotic heart disease)   . Chronic kidney disease   . Erectile dysfunction   . Hyperlipidemia   . Hypertension   . Hypothyroidism   . MI (myocardial infarction) ([place])   . Prostate cancer ([place])   . Vertigo     There are no active problems to display for this patient.   Past Surgical History:  Procedure Laterality Date  . CHOLECYSTECTOMY    . COLONOSCOPY    . COLONOSCOPY WITH PROPOFOL N/A 12/16/2015   Procedure: COLONOSCOPY WITH PROPOFOL;  Surgeon: Manya Silvas, MD;  Location: [facility] ENDOSCOPY;  Service: Endoscopy;  Laterality: N/A;  . INGUINAL HERNIA REPAIR    . PROSTATECTOMY      Prior to Admission medications   Medication Sig Start Date End Date Taking? Authorizing Provider  amoxicillin-clavulanate (AUGMENTIN) 875-125 MG tablet Take 1 tablet by mouth 2 (two) times daily. 11/09/15   Cuthriell, Charline Bills, PA-C  aspirin EC 81 MG tablet Take 1 tablet by mouth daily.    [provider]  atenolol (TENORMIN) 50 MG tablet Take 1 tablet by mouth daily. 06/26/15   [provider]  atorvastatin (LIPITOR) 40 MG tablet Take 40 mg by mouth daily.    [provider]  ondansetron (ZOFRAN) 4 MG tablet Take 1 tablet (4 mg total) by mouth every 8 (eight) hours as  needed for nausea or vomiting. 09/03/15   Daymon Larsen, MD  SYNTHROID 75 MCG tablet Take 1 tablet by mouth every morning. 08/28/15   [provider]  traMADol (ULTRAM) 50 MG tablet Take 1 tablet (50 mg total) by mouth every 6 (six) hours as needed. 09/03/15   Daymon Larsen, MD  TRAVATAN Z 0.004 % SOLN ophthalmic solution Place 1 drop into both eyes at bedtime. 08/21/15   [provider]  vitamin C (ASCORBIC ACID) 500 MG tablet Take 1 tablet by mouth daily.    [provider]     Allergies Patient has no known allergies.  No family history on file.  Social History Social History   Tobacco Use  . Smoking status: Never Smoker  . Smokeless tobacco: Never Used  Substance Use Topics  . Alcohol use: No  . Drug use: No    Review of Systems  Constitutional: No fever/chills Eyes: No visual changes.  ENT: No sore throat. Cardiovascular: Denies chest pain. Respiratory: Denies shortness of breath. Gastrointestinal: As above Genitourinary: Negative for dysuria. Musculoskeletal: Negative for back pain. Skin: Negative for rash. Neurological: Negative for headaches or weakness   ____________________________________________   PHYSICAL EXAM:  VITAL SIGNS: ED Triage Vitals  Enc Vitals Group     BP 11/28/18 1335 127/66     Pulse Rate 11/28/18 1335 70     Resp 11/28/18 1335 16  Temp 11/28/18 1335 98.2 F (36.8 C)     Temp Source 11/28/18 1335 Oral     SpO2 11/28/18 1335 93 %     Weight 11/28/18 1336 63.5 kg (140 lb)     Height 11/28/18 1336 1.753 m (5\' 9" )     Head Circumference --      Peak Flow --      Pain Score 11/28/18 1336 2     Pain Loc --      Pain Edu? --      Excl. in [place]? --     Constitutional: Alert and oriented.  Eyes: Conjunctivae are normal.   Mouth/Throat: Mucous membranes are moist.   Neck:  Painless ROM Cardiovascular: Normal rate, regular rhythm. Grossly normal heart sounds.  Good peripheral circulation. Respiratory:  Normal respiratory effort.  No retractions. Lungs CTAB. Gastrointestinal: Mild tenderness in the lower abdomen, mild distention, no CVA tenderness  Musculoskeletal:   Warm and well perfused Neurologic:  Normal speech and language. No gross focal neurologic deficits are appreciated.  Skin:  Skin is warm, dry and intact. No rash noted. Psychiatric: Mood and affect are normal. Speech and behavior are normal.  ____________________________________________   LABS (all labs ordered are listed, but only abnormal results are displayed)  Labs Reviewed  COMPREHENSIVE METABOLIC PANEL - Abnormal; Notable for the following components:      Result Value   Sodium 133 (*)    Chloride 96 (*)    Glucose, Bld 204 (*)    BUN 78 (*)    Creatinine, Ser 2.39 (*)    AST 42 (*)    Total Bilirubin 1.4 (*)    GFR calc non Af Amer 25 (*)    GFR calc Af Amer 28 (*)    All other components within normal limits  CBC - Abnormal; Notable for the following components:   WBC 11.7 (*)    All other components within normal limits  URINALYSIS, COMPLETE (UACMP) WITH MICROSCOPIC - Abnormal; Notable for the following components:   Color, Urine AMBER (*)    APPearance CLOUDY (*)    Protein, ur 100 (*)    Bacteria, UA RARE (*)    All other components within normal limits  POCT I-STAT, CHEM 8 - Abnormal; Notable for the following components:   BUN 81 (*)    Creatinine, Ser 2.20 (*)    Glucose, Bld 150 (*)    Calcium, Ion 1.09 (*)    All other components within normal limits  LIPASE, BLOOD  I-STAT CHEM 8, ED   ____________________________________________  EKG   ____________________________________________  RADIOLOGY  ED ECG REPORT I, Lavonia Drafts, the attending physician, personally viewed and interpreted this ECG.  Date: 11/28/2018  Rhythm: normal sinus rhythm QRS Axis: normal Intervals: normal ST/T Wave abnormalities: normal Narrative Interpretation: no evidence of acute  ischemia  ____________________________________________   PROCEDURES  Procedure(s) performed: No  Procedures   Critical Care performed: No ____________________________________________   INITIAL IMPRESSION / ASSESSMENT AND PLAN / ED COURSE  Pertinent labs & imaging results that were available during my care of the patient were reviewed by me and considered in my medical decision making (see chart for details).  Patient presents with complaints of abdominal distention, abdominal pain as described above.  Minimal elevation of white blood cell count however significant elevation of creatinine.  The patient tells me that he only has 1 functioning kidney.  Possible gastritis versus gastroenteritis however more pain than expected will obtain CT  CT demonstrates  small bowel obstruction.  Discussed with Dr. Rosana Hoes of general surgery, he will see the patient.  Recommends NG tube  Admitted the patient to Dr. Vianne Bulls of internal medicine    ____________________________________________   FINAL CLINICAL IMPRESSION(S) / ED DIAGNOSES  Final diagnoses:  Small bowel obstruction ([place])  Acute kidney injury ([place])        Note:  This document was prepared using Dragon voice recognition software and may include unintentional dictation errors.   Lavonia Drafts, MD 11/28/18 1946

## 2018-11-28 NOTE — Consult Note (Addendum)
SURGICAL CONSULTATION NOTE (initial) - cpt: 18563  HISTORY OF PRESENT ILLNESS (HPI):  84 y.o. male presented to [facility] ED tonight for evaluation of abdominal pain, bloating, and nausea with non-bloody emesis. Patient reports he developed these symptoms yesterday and has not vomited yet today, though continues to describe persistent belching without BM's since 2 days ago despite ongoing flatus. Patient previously 10 years ago underwent robotic laparoscope-assisted prostatectomy for prostate cancer and has a history of laparoscopic cholecystectomy, though patient does not recall when he underwent the latter. His otherwise denies any fever/chills, recent unintentional weight loss, CP or SOB, and his son (an Therapist, sports with [facility] [employer] cancer center) describes his father as quite active s/p coronary angioplasty without stenting at [facility] for MI 20 years ago and a potentially congenital solitary kidney discovered during workup for prostate cancer. Patient denies any prior such SBO's.  Patient is reportedly being admitted to the hospitalist service per ED physician, and surgery is consulted by ED physician Dr. Corky Downs in this context for evaluation and management of SBO.  PAST MEDICAL HISTORY (PMH):  Past Medical History:  Diagnosis Date  . Anemia   . ASHD (arteriosclerotic heart disease)   . Chronic kidney disease   . Erectile dysfunction   . Hyperlipidemia   . Hypertension   . Hypothyroidism   . MI (myocardial infarction) ([place])   . Prostate cancer ([place])   . Vertigo     PAST SURGICAL HISTORY ([place]):  Past Surgical History:  Procedure Laterality Date  . CHOLECYSTECTOMY    . COLONOSCOPY    . COLONOSCOPY WITH PROPOFOL N/A 12/16/2015   Procedure: COLONOSCOPY WITH PROPOFOL;  Surgeon: Manya Silvas, MD;  Location: [facility] ENDOSCOPY;  Service: Endoscopy;  Laterality: N/A;  . INGUINAL HERNIA REPAIR    . PROSTATECTOMY      MEDICATIONS:  Prior to Admission medications   Medication Sig Start Date End Date Taking?  Authorizing Provider  amoxicillin-clavulanate (AUGMENTIN) 875-125 MG tablet Take 1 tablet by mouth 2 (two) times daily. 11/09/15   Cuthriell, Charline Bills, PA-C  aspirin EC 81 MG tablet Take 1 tablet by mouth daily.    [provider]  atenolol (TENORMIN) 50 MG tablet Take 1 tablet by mouth daily. 06/26/15   [provider]  atorvastatin (LIPITOR) 40 MG tablet Take 40 mg by mouth daily.    [provider]  ondansetron (ZOFRAN) 4 MG tablet Take 1 tablet (4 mg total) by mouth every 8 (eight) hours as needed for nausea or vomiting. 09/03/15   Daymon Larsen, MD  SYNTHROID 75 MCG tablet Take 1 tablet by mouth every morning. 08/28/15   [provider]  traMADol (ULTRAM) 50 MG tablet Take 1 tablet (50 mg total) by mouth every 6 (six) hours as needed. 09/03/15   Daymon Larsen, MD  TRAVATAN Z 0.004 % SOLN ophthalmic solution Place 1 drop into both eyes at bedtime. 08/21/15   [provider]  vitamin C (ASCORBIC ACID) 500 MG tablet Take 1 tablet by mouth daily.    [provider]    ALLERGIES:  No Known Allergies   SOCIAL HISTORY:  Social History   Socioeconomic History  . Marital status: Married    Spouse name: Not on file  . Number of children: Not on file  . Years of education: Not on file  . Highest education level: Not on file  Occupational History  . Not on file  Social Needs  . Financial resource strain: Not on file  . Food  insecurity:    Worry: Not on file    Inability: Not on file  . Transportation needs:    Medical: Not on file    Non-medical: Not on file  Tobacco Use  . Smoking status: Never Smoker  . Smokeless tobacco: Never Used  Substance and Sexual Activity  . Alcohol use: No  . Drug use: No  . Sexual activity: Not on file  Lifestyle  . Physical activity:    Days per week: Not on file    Minutes per session: Not on file  . Stress: Not on file  Relationships  . Social connections:    Talks on phone: Not on file     Gets together: Not on file    Attends religious service: Not on file    Active member of club or organization: Not on file    Attends meetings of clubs or organizations: Not on file    Relationship status: Not on file  . Intimate partner violence:    Fear of current or ex partner: Not on file    Emotionally abused: Not on file    Physically abused: Not on file    Forced sexual activity: Not on file  Other Topics Concern  . Not on file  Social History Narrative  . Not on file    The patient currently resides (home / rehab facility / nursing home): Home The patient normally is (ambulatory / bedbound): Ambulatory   FAMILY HISTORY:  No family history on file.   REVIEW OF SYSTEMS:  Constitutional: denies weight loss, fever, chills, or sweats  Eyes: denies any other vision changes, history of eye injury  ENT: denies sore throat, hearing problems  Respiratory: denies shortness of breath, wheezing  Cardiovascular: denies chest pain, palpitations  Gastrointestinal: abdominal pain, N/V, and bowel function as per HPI Genitourinary: denies burning with urination or urinary frequency Musculoskeletal: denies any other joint pains or cramps  Skin: denies any other rashes or skin discolorations  Neurological: denies any other headache, dizziness, weakness  Psychiatric: denies any other depression, anxiety   All other review of systems were negative   VITAL SIGNS:  Temp:  [98.2 F (36.8 C)] 98.2 F (36.8 C) (01/06 1335) Pulse Rate:  [70] 70 (01/06 1810) Resp:  [16-18] 18 (01/06 1810) BP: (127)/(65-66) 127/65 (01/06 1810) SpO2:  [93 %-95 %] 95 % (01/06 1810) Weight:  [63.5 kg] 63.5 kg (01/06 1336)     Height: 5\' 9"  (175.3 cm) Weight: 63.5 kg BMI (Calculated): 20.67   INTAKE/OUTPUT:  This shift: No intake/output data recorded.  Last 2 shifts: @IOLAST2SHIFTS @   PHYSICAL EXAM:  Constitutional:  -- Normal body habitus  -- Awake, alert, and oriented x3, no apparent distress Eyes:   -- Pupils equally round and reactive to light  -- No scleral icterus, B/L no occular discharge Ear, nose, throat: -- Neck is FROM WNL -- No jugular venous distension  Pulmonary:  -- No wheezes or rhales -- Equal breath sounds bilaterally -- Breathing non-labored at rest Cardiovascular:  -- S1, S2 present  -- No pericardial rubs  Gastrointestinal:  -- Abdomen soft and moderately distended with mild lower abdominal tenderness, no guarding or rebound tenderness -- No abdominal masses appreciated, pulsatile or otherwise  Musculoskeletal and Integumentary:  -- Wounds or skin discoloration: None appreciated -- Extremities: B/L UE and LE FROM, hands and feet warm, no edema  Neurologic:  -- Motor function: Intact and symmetric -- Sensation: Intact and symmetric Psychiatric:  -- Mood and affect  WNL  Labs:  CBC Latest Ref Rng & Units 11/28/2018 11/28/2018 09/03/2015  WBC 4.0 - 10.5 K/uL - 11.7(H) 8.2  Hemoglobin 13.0 - 17.0 g/dL 14.6 15.4 13.7  Hematocrit 39.0 - 52.0 % 43.0 45.0 39.4(L)  Platelets 150 - 400 K/uL - 250 208   CMP Latest Ref Rng & Units 11/28/2018 11/28/2018 09/03/2015  Glucose 70 - 99 mg/dL 150(H) 204(H) 214(H)  BUN 8 - 23 mg/dL 81(H) 78(H) 28(H)  Creatinine 0.61 - 1.24 mg/dL 2.20(H) 2.39(H) 1.21  Sodium 135 - 145 mmol/L 135 133(L) 140  Potassium 3.5 - 5.1 mmol/L 4.9 5.0 4.2  Chloride 98 - 111 mmol/L 100 96(L) 105  CO2 22 - 32 mmol/L - 23 29  Calcium 8.9 - 10.3 mg/dL - 9.3 8.9  Total Protein 6.5 - 8.1 g/dL - 7.6 -  Total Bilirubin 0.3 - 1.2 mg/dL - 1.4(H) -  Alkaline Phos 38 - 126 U/L - 73 -  AST 15 - 41 U/L - 42(H) -  ALT 0 - 44 U/L - 44 -   Imaging studies:  CT Abdomen and Pelvis without IV Contrast (11/28/2017) - personally reviewed and discussed with patient and his son 1. Small bowel obstruction with suspected transition point in the low mid abdomen. 2. Nonobstructing right nephrolithiasis.  Assessment/Plan: (ICD-10's: K53.51) 84 y.o. male with likely partial  small bowel obstruction, unclear whether attributable to post-surgical adhesions following only prior laparoscopic abdominal surgeries and history of prostate cancer, complicated by AKI (likely attributable at least in part to hypovolemia) and by pertinent comorbidities including advanced chronological age, HTN, HLD, CAD s/p remote PCI without stent for MI, CKD with solitary kidney, hypothyroidism, and vertigo.  - NPO for now, IV fluids             - monitor ongoing bowel function and abdominal exam              - ordered NG tube for nasogastric decompression seems reasonable             - anticipate symptomatic relief within 24 - 48 hours following NGT insertion, followed by "rumbling" the following day and flatus either the same day or the day following the "rumbling" with anticipated length of stay ~3 - 5 days with successful non-operative management for 8 of 10 patients with small bowel obstruction attributed to post-surgical adhesions  - surgical intervention if doesn't improve was also discussed, as was possible subsequent workup considering history of malignancy without prior open abdominal surgery  - no indication for daily abdominal x-rays or antibiotics, will follow closely along with admitting medical team             - medical management comorbidities as per medical team             - ambulation encouraged              - DVT prophylaxis  All of the above findings and recommendations were discussed with the patient, his son, ED physician, and hospitalist, and all of patient's and his son's questions were answered to their expressed satisfaction.  Thank you for the opportunity to participate in this patient's care.   -- Marilynne Drivers Rosana Hoes, MD, [place]: [facility] - Partnering for exceptional care. Office: [phone]

## 2018-11-28 NOTE — H&P (Signed)
[facility] [place] NAME: Daniel Lewis    MR#:  [id number]  DATE OF BIRTH:  1935/02/24  DATE OF ADMISSION:  11/28/2018  PRIMARY CARE PHYSICIAN: Idelle Crouch, MD   REQUESTING/REFERRING PHYSICIAN: Dr. Corky Downs  CHIEF COMPLAINT: Emesis, abdominal pain   Chief Complaint  Patient presents with  . Emesis  . Abdominal Pain    HISTORY OF PRESENT ILLNESS:  Daniel Lewis  is a 84 y.o. male with a known history of hypertension, hyperlipidemia comes in because of abdominal pain,, nausea, vomiting since yesterday.  Because of abdominal pain, nausea he came to hospital, patient CT abdomen is concerning for small bowel obstruction.  Patient seen by surgery Dr. Rosana Hoes, recommended NG tube, patient also has renal insufficiency.  Patient had no chest pain or shortness of breath his main complaint is abdominal pain, nausea, vomiting since yesterday although he is pain is better today.  No diarrhea or constipation.  No fever or chills.  PAST MEDICAL HISTORY:   Past Medical History:  Diagnosis Date  . Anemia   . ASHD (arteriosclerotic heart disease)   . Chronic kidney disease   . Erectile dysfunction   . Hyperlipidemia   . Hypertension   . Hypothyroidism   . MI (myocardial infarction) ([place])   . Prostate cancer ([place])   . Vertigo     PAST SURGICAL HISTOIRY:   Past Surgical History:  Procedure Laterality Date  . CHOLECYSTECTOMY    . COLONOSCOPY    . COLONOSCOPY WITH PROPOFOL N/A 12/16/2015   Procedure: COLONOSCOPY WITH PROPOFOL;  Surgeon: Manya Silvas, MD;  Location: [facility] ENDOSCOPY;  Service: Endoscopy;  Laterality: N/A;  . INGUINAL HERNIA REPAIR    . PROSTATECTOMY      SOCIAL HISTORY:   Social History   Tobacco Use  . Smoking status: Never Smoker  . Smokeless tobacco: Never Used  Substance Use Topics  . Alcohol use: No    FAMILY HISTORY:  No family history on file.  DRUG ALLERGIES:  No Known Allergies  REVIEW OF  SYSTEMS:  CONSTITUTIONAL: No fever, fatigue or weakness.  EYES: No blurred or double vision.  EARS, NOSE, AND THROAT: No tinnitus or ear pain.  RESPIRATORY: No cough, shortness of breath, wheezing or hemoptysis.  CARDIOVASCULAR: No chest pain, orthopnea, edema.  GASTROINTESTINAL; nausea, vomiting, abdominal pain.   GENITOURINARY: No dysuria, hematuria.  ENDOCRINE: No polyuria, nocturia,  HEMATOLOGY: No anemia, easy bruising or bleeding SKIN: No rash or lesion. MUSCULOSKELETAL: No joint pain or arthritis.   NEUROLOGIC: No tingling, numbness, weakness.  PSYCHIATRY: No anxiety or depression.   MEDICATIONS AT HOME:   Prior to Admission medications   Medication Sig Start Date End Date Taking? Authorizing Provider  amoxicillin-clavulanate (AUGMENTIN) 875-125 MG tablet Take 1 tablet by mouth 2 (two) times daily. 11/09/15   Cuthriell, Charline Bills, PA-C  aspirin EC 81 MG tablet Take 1 tablet by mouth daily.    [provider]  atenolol (TENORMIN) 50 MG tablet Take 1 tablet by mouth daily. 06/26/15   [provider]  atorvastatin (LIPITOR) 40 MG tablet Take 40 mg by mouth daily.    [provider]  ondansetron (ZOFRAN) 4 MG tablet Take 1 tablet (4 mg total) by mouth every 8 (eight) hours as needed for nausea or vomiting. 09/03/15   Daymon Larsen, MD  SYNTHROID 75 MCG tablet Take 1 tablet by mouth every morning. 08/28/15   [provider]  traMADol (ULTRAM) 50 MG  tablet Take 1 tablet (50 mg total) by mouth every 6 (six) hours as needed. 09/03/15   Daymon Larsen, MD  TRAVATAN Z 0.004 % SOLN ophthalmic solution Place 1 drop into both eyes at bedtime. 08/21/15   [provider]  vitamin C (ASCORBIC ACID) 500 MG tablet Take 1 tablet by mouth daily.    [provider]      VITAL SIGNS:  Blood pressure 127/65, pulse 70, temperature 98.2 F (36.8 C), temperature source Oral, resp. rate 18, height 5\' 9"  (1.753 m), weight 63.5 kg, SpO2 95  %.  PHYSICAL EXAMINATION:  GENERAL:  84 y.o.-year-old patient lying in the bed with no acute distress.  EYES: Pupils equal, round, reactive to light and accommodation. No scleral icterus. Extraocular muscles intact.  HEENT: Head atraumatic, normocephalic. Oropharynx and nasopharynx clear.  NECK:  Supple, no jugular venous distention. No thyroid enlargement, no tenderness.  LUNGS: Normal breath sounds bilaterally, no wheezing, rales,rhonchi or crepitation. No use of accessory muscles of respiration.  CARDIOVASCULAR: S1, S2 normal. No murmurs, rubs, or gallops.  ABDOMEN:  abdomen is distended, bowel sounds diminished, slight epigastric tenderness present. EXTREMITIES: No pedal edema, cyanosis, or clubbing.  NEUROLOGIC: Cranial nerves II through XII are intact. Muscle strength 5/5 in all extremities. Sensation intact. Gait not checked.  PSYCHIATRIC: The patient is alert and oriented x 3.  SKIN: No obvious rash, lesion, or ulcer.   LABORATORY PANEL:   CBC Recent Labs  Lab 11/28/18 1343 11/28/18 1718  WBC 11.7*  --   HGB 15.4 14.6  HCT 45.0 43.0  PLT 250  --    ------------------------------------------------------------------------------------------------------------------  Chemistries  Recent Labs  Lab 11/28/18 1343 11/28/18 1718  NA 133* 135  K 5.0 4.9  CL 96* 100  CO2 23  --   GLUCOSE 204* 150*  BUN 78* 81*  CREATININE 2.39* 2.20*  CALCIUM 9.3  --   AST 42*  --   ALT 44  --   ALKPHOS 73  --   BILITOT 1.4*  --    ------------------------------------------------------------------------------------------------------------------  Cardiac Enzymes No results for input(s): TROPONINI in the last 168 hours. ------------------------------------------------------------------------------------------------------------------  RADIOLOGY:  Ct Abdomen Pelvis Wo Contrast  Result Date: 11/28/2018 CLINICAL DATA:  Vomiting and abdominal pain EXAM: CT ABDOMEN AND PELVIS WITHOUT  CONTRAST TECHNIQUE: Multidetector CT imaging of the abdomen and pelvis was performed following the standard protocol without IV contrast. COMPARISON:  None. FINDINGS: LOWER CHEST: There is no basilar pleural or apical pericardial effusion. HEPATOBILIARY: The hepatic contours and density are normal. There is no intra- or extrahepatic biliary dilatation. The gallbladder is normal. PANCREAS: The pancreatic parenchymal contours are normal and there is no ductal dilatation. There is no peripancreatic fluid collection. SPLEEN: Normal. ADRENALS/URINARY TRACT: --Adrenal glands: Normal. --Right kidney/ureter: Multiple nonobstructing renal calculi, measuring up to 5 mm. No hydronephrosis, perinephric stranding or solid renal mass. Interpolar renal cyst measures 3.1 cm, increased since 2009. --Left kidney/ureter: Absent --Urinary bladder: Normal for degree of distention STOMACH/BOWEL: --Stomach/Duodenum: There is no hiatal hernia or other gastric abnormality. The duodenal course and caliber are normal. --Small bowel: There is proximal small bowel dilatation with multiple fluid-filled loops of jejunum. There is a transition point in the low mid abdomen (sagittal image 51). The ileum is decompressed. --Colon: No focal abnormality. --Appendix: Normal. VASCULAR/LYMPHATIC: There is aortic atherosclerosis without hemodynamically significant stenosis. No abdominal or pelvic lymphadenopathy. REPRODUCTIVE: Status post prostatectomy. MUSCULOSKELETAL. No bony spinal canal stenosis or focal osseous abnormality. OTHER: None. IMPRESSION: 1. Small bowel  obstruction with suspected transition point in the low mid abdomen. 2. Nonobstructing right nephrolithiasis. Aortic Atherosclerosis (ICD10-I70.0). Electronically Signed   By: Ulyses Jarred M.D.   On: 11/28/2018 16:28    EKG:   Orders placed or performed during the hospital encounter of 11/28/18  . ED EKG  . ED EKG  . EKG 12-Lead  . EKG 12-Lead   EKG shows normal sinus rhythm, with  no ST-T changes. IMPRESSION AND PLAN:   84 year old male patient with history of essential hypertension, previous MI, hyperlipidemia, hypothyroidism, prostate cancer, congenital  single kidney comes in with abdominal pain, nausea since yesterday and now has small bowel obstruction.  #1/ acute abdominal pain with nausea secondary to a small bowel obstruction, patient CT abdomen is concerning for a transition point to the lower midabdomen, seen by Dr. Tama High from surgery, recommended NG tube, hopefully patient will get better with conservative treatment, continue NG tube, n.p.o., IV fluids, start empiric antibiotics. 2.  Acute renal failure, patient has history of single kidney, renal function was done 2 years ago showed creatinine 1.21, at that time GFR 56, today patient GFR is 25, creatinine 2.39, continue n.p.o., avoid nephrotoxic agents, monitor kidney function closely, continue IV fluids. 3.  Discussed with surgery.   All the records are reviewed and case discussed with ED provider. Management plans discussed with the patient, family and they are in agreement.  CODE STATUS: Full code  TOTAL TIME TAKING CARE OF THIS PATIENT: 55 minutes.    Epifanio Lesches M.D on 11/28/2018 at 8:31 PM  Between 7am to 6pm - Pager - [phone]  After 6pm go to [email or web address] - password [email or web address] [place]  Office  [phone]  CC: Primary care physician; Idelle Crouch, MD  Note: This dictation was prepared with Dragon dictation along with smaller phrase technology. Any transcriptional errors that result from this process are unintentional.

## 2018-11-29 LAB — CBC
HEMATOCRIT: 41.1 % (ref 39.0–52.0)
Hemoglobin: 13.9 g/dL (ref 13.0–17.0)
MCH: 31.7 pg (ref 26.0–34.0)
MCHC: 33.8 g/dL (ref 30.0–36.0)
MCV: 93.8 fL (ref 80.0–100.0)
Platelets: 226 10*3/uL (ref 150–400)
RBC: 4.38 MIL/uL (ref 4.22–5.81)
RDW: 12.4 % (ref 11.5–15.5)
WBC: 10.1 10*3/uL (ref 4.0–10.5)
nRBC: 0 % (ref 0.0–0.2)

## 2018-11-29 LAB — BASIC METABOLIC PANEL
Anion gap: 10 (ref 5–15)
BUN: 83 mg/dL — ABNORMAL HIGH (ref 8–23)
CO2: 23 mmol/L (ref 22–32)
CREATININE: 1.91 mg/dL — AB (ref 0.61–1.24)
Calcium: 8.5 mg/dL — ABNORMAL LOW (ref 8.9–10.3)
Chloride: 101 mmol/L (ref 98–111)
GFR calc Af Amer: 37 mL/min — ABNORMAL LOW (ref >60)
GFR calc non Af Amer: 32 mL/min — ABNORMAL LOW (ref >60)
Glucose, Bld: 168 mg/dL — ABNORMAL HIGH (ref 70–99)
Potassium: 4.6 mmol/L (ref 3.5–5.1)
Sodium: 134 mmol/L — ABNORMAL LOW (ref 135–145)

## 2018-11-29 LAB — LACTIC ACID, PLASMA: Lactic Acid, Venous: 1.3 mmol/L (ref 0.5–1.9)

## 2018-11-29 LAB — GLUCOSE, CAPILLARY: Glucose-Capillary: 135 mg/dL — ABNORMAL HIGH (ref 70–99)

## 2018-11-29 NOTE — Progress Notes (Signed)
[facility] NAME: Daniel [facility]    MR#:  [id number]  DATE OF BIRTH:  02-13-1935  SUBJECTIVE:   Patient feels that his abdominal pain has improved and his abdomen is less distended.  REVIEW OF SYSTEMS:    Review of Systems  Constitutional: Negative for fever, chills weight loss HENT: Negative for ear pain, nosebleeds, congestion, facial swelling, rhinorrhea, neck pain, neck stiffness and ear discharge.   Respiratory: Negative for cough, shortness of breath, wheezing  Cardiovascular: Negative for chest pain, palpitations and leg swelling.  Gastrointestinal: Negative for heartburn, abdominal pain, vomiting, diarrhea or consitpation Genitourinary: Negative for dysuria, urgency, frequency, hematuria Musculoskeletal: Negative for back pain or joint pain Neurological: Negative for dizziness, seizures, syncope, focal weakness,  numbness and headaches.  Hematological: Does not bruise/bleed easily.  Psychiatric/Behavioral: Negative for hallucinations, confusion, dysphoric mood    Tolerating Diet: npo      DRUG ALLERGIES:  No Known Allergies  VITALS:  Blood pressure 135/66, pulse (!) 58, temperature 98.3 F (36.8 C), temperature source Oral, resp. rate 20, height 5\' 9"  (1.753 m), weight 61 kg, SpO2 95 %.  PHYSICAL EXAMINATION:  Constitutional: Appears well-developed and well-nourished. No distress. HENT: Normocephalic. . NG tube placed  eyes: Conjunctivae and EOM are normal. PERRLA, no scleral icterus.  Neck: Normal ROM. Neck supple. No JVD. No tracheal deviation. CVS: RRR, S1/S2 +, no murmurs, no gallops, no carotid bruit.  Pulmonary: Effort and breath sounds normal, no stridor, rhonchi, wheezes, rales.  Abdominal: Soft.  Hypoactive bowel sounds minimally distended no tenderness, rebound or guarding.  Musculoskeletal: Normal range of motion. No edema and no tenderness.  Neuro: Alert. CN 2-12 grossly intact. No focal  deficits. Skin: Skin is warm and dry. No rash noted. Psychiatric: Normal mood and affect.      LABORATORY PANEL:   CBC Recent Labs  Lab 11/29/18 0101  WBC 10.1  HGB 13.9  HCT 41.1  PLT 226   ------------------------------------------------------------------------------------------------------------------  Chemistries  Recent Labs  Lab 11/28/18 1343  11/29/18 0101  NA 133*   < > 134*  K 5.0   < > 4.6  CL 96*   < > 101  CO2 23  --  23  GLUCOSE 204*   < > 168*  BUN 78*   < > 83*  CREATININE 2.39*   < > 1.91*  CALCIUM 9.3  --  8.5*  AST 42*  --   --   ALT 44  --   --   ALKPHOS 73  --   --   BILITOT 1.4*  --   --    < > = values in this interval not displayed.   ------------------------------------------------------------------------------------------------------------------  Cardiac Enzymes No results for input(s): TROPONINI in the last 168 hours. ------------------------------------------------------------------------------------------------------------------  RADIOLOGY:  Ct Abdomen Pelvis Wo Contrast  Result Date: 11/28/2018 CLINICAL DATA:  Vomiting and abdominal pain EXAM: CT ABDOMEN AND PELVIS WITHOUT CONTRAST TECHNIQUE: Multidetector CT imaging of the abdomen and pelvis was performed following the standard protocol without IV contrast. COMPARISON:  None. FINDINGS: LOWER CHEST: There is no basilar pleural or apical pericardial effusion. HEPATOBILIARY: The hepatic contours and density are normal. There is no intra- or extrahepatic biliary dilatation. The gallbladder is normal. PANCREAS: The pancreatic parenchymal contours are normal and there is no ductal dilatation. There is no peripancreatic fluid collection. SPLEEN: Normal. ADRENALS/URINARY TRACT: --Adrenal glands: Normal. --Right kidney/ureter: Multiple nonobstructing renal calculi, measuring up to 5 mm. No hydronephrosis, perinephric stranding  or solid renal mass. Interpolar renal cyst measures 3.1 cm, increased  since 2009. --Left kidney/ureter: Absent --Urinary bladder: Normal for degree of distention STOMACH/BOWEL: --Stomach/Duodenum: There is no hiatal hernia or other gastric abnormality. The duodenal course and caliber are normal. --Small bowel: There is proximal small bowel dilatation with multiple fluid-filled loops of jejunum. There is a transition point in the low mid abdomen (sagittal image 51). The ileum is decompressed. --Colon: No focal abnormality. --Appendix: Normal. VASCULAR/LYMPHATIC: There is aortic atherosclerosis without hemodynamically significant stenosis. No abdominal or pelvic lymphadenopathy. REPRODUCTIVE: Status post prostatectomy. MUSCULOSKELETAL. No bony spinal canal stenosis or focal osseous abnormality. OTHER: None. IMPRESSION: 1. Small bowel obstruction with suspected transition point in the low mid abdomen. 2. Nonobstructing right nephrolithiasis. Aortic Atherosclerosis (ICD10-I70.0). Electronically Signed   By: Ulyses Jarred M.D.   On: 11/28/2018 16:28     ASSESSMENT AND PLAN:   84 year old male with history of chronic kidney disease stage III with solitary kidney and hypertension who presented to the emergency room with abdominal pain.  1.  Abdominal pain partial small bowel obstruction: Continue management as per surgery Symptoms improving Continue NG tube for decompression  2.  Hypertension: Continue atenolol  3.  Hypothyroidism: Continue Synthroid  4.  Hyperlipidemia: Continue statin  5.  Chronic kidney disease stage III with solitary kidney: Creatinine seems to be improving with IV fluids Hold nephrotoxic medications      Management plans discussed with the patient and he is in agreement.  CODE STATUS: Full  TOTAL TIME TAKING CARE OF THIS PATIENT: 30 minutes.     POSSIBLE D/C for 3 days, DEPENDING ON CLINICAL CONDITION.   Ellisha Bankson M.D on 11/29/2018 at 11:54 AM  Between 7am to 6pm - Pager - [phone] After 6pm go to [email or web address] - password [email or web address]  [facility]  Office  [phone]  CC: Primary care physician; Idelle Crouch, MD  Note: This dictation was prepared with Dragon dictation along with smaller phrase technology. Any transcriptional errors that result from this process are unintentional.

## 2018-11-29 NOTE — Progress Notes (Addendum)
SURGICAL PROGRESS NOTE (cpt: [phone])  Patient seen and examined as described below with surgical PA-C, Ardell Isaacs.  Assessment/Plan: (ICD-10's: K56.21) In summary, patient is an 84 y.o. malewith clinically improving partial small bowel obstruction vs gastroenteritis, unclear whether attributable to post-surgical adhesions following only prior laparoscopic abdominal surgeries and history of prostate cancer, complicated by improving AKI (likely attributable at least in part to hypovolemia) and by pertinent comorbidities including advanced chronological age, HTN, HLD, CAD s/p remote PCI without stent for MI, CKD with solitary kidney, hypothyroidism, and vertigo.   - NPO + IVF  - continue NGT decompression and monitor output  - monitor abdominal examination and ongoing bowel function             - surgical intervention if doesn't improve was also discussed, as was possible subsequent workup considering history of malignancy without prior open abdominal surgery             - no indication for daily abdominal x-rays or antibiotics, will follow closely along with admitting medical team - medical management comorbidities as per medical team - ambulation encouraged - DVT prophylaxis  I have personally reviewed the patient's chart, evaluated/examined the patient, proposed the recommended management, and discussed these recommendations with the patient and his family to their expressed satisfaction as well as with patient's RN.  -- Marilynne Drivers. Rosana Hoes, MD, [place]: [facility] - Partnering for exceptional care. Office: [facility] SURGICAL PROGRESS NOTE (cpt [phone])  Hospital Day(s): 1.   Post op day(s):  Marland Kitchen   Interval History: Patient seen and examined, no acute events or new complaints overnight. Patient reports that he has had improvement in his abdominal pain since placement of his NGT  last night. He feels less distended as well. No complaints of nausea or emesis. He feel like he may have passed minimal flatus last night but nothing to report this AM. No reports of BM. No other additional complaints this morning.   Review of Systems:  Constitutional: denies fever, chills  Respiratory: denies any shortness of breath  Cardiovascular: denies chest pain or palpitations  Gastrointestinal: denies abdominal pain, N/V, or diarrhea/and bowel function as per interval history Genitourinary: denies burning with urination or urinary frequency  Vital signs in last 24 hours: [min-max] current  Temp:  [98.2 F (36.8 C)-98.7 F (37.1 C)] 98.2 F (36.8 C) (01/07 0503) Pulse Rate:  [56-70] 56 (01/07 0503) Resp:  [16-20] 20 (01/07 0503) BP: (122-152)/(60-66) 122/66 (01/07 0503) SpO2:  [93 %-99 %] 94 % (01/07 0503) Weight:  [61 kg-63.5 kg] 61 kg (01/06 2156)     Height: 5\' 9"  (175.3 cm) Weight: 61 kg BMI (Calculated): 19.85   Intake/Output this shift:  No intake/output data recorded.   Intake/Output last 2 shifts:  @IOLAST2SHIFTS @   Physical Exam:  Constitutional: alert, cooperative and no distress  HENT: normocephalic without obvious abnormality, NGT in place  Eyes: EOM's grossly intact and symmetric  Respiratory: breathing non-labored at rest  Gastrointestinal: soft, non-tender, and non-distended Musculoskeletal: no edema or wounds, motor and sensation grossly intact, NT   Labs:  CBC Latest Ref Rng & Units 11/29/2018 11/28/2018 11/28/2018  WBC 4.0 - 10.5 K/uL 10.1 - 11.7(H)  Hemoglobin 13.0 - 17.0 g/dL 13.9 14.6 15.4  Hematocrit 39.0 - 52.0 % 41.1 43.0 45.0  Platelets 150 - 400 K/uL 226 - 250   CMP Latest Ref Rng & Units 11/29/2018 11/28/2018 11/28/2018  Glucose 70 - 99 mg/dL 168(H) 150(H)  204(H)  BUN 8 - 23 mg/dL 83(H) 81(H) 78(H)  Creatinine 0.61 - 1.24 mg/dL 1.91(H) 2.20(H) 2.39(H)  Sodium 135 - 145 mmol/L 134(L) 135 133(L)  Potassium 3.5 - 5.1 mmol/L 4.6 4.9 5.0  Chloride 98 -  111 mmol/L 101 100 96(L)  CO2 22 - 32 mmol/L 23 - 23  Calcium 8.9 - 10.3 mg/dL 8.5(L) - 9.3  Total Protein 6.5 - 8.1 g/dL - - 7.6  Total Bilirubin 0.3 - 1.2 mg/dL - - 1.4(H)  Alkaline Phos 38 - 126 U/L - - 73  AST 15 - 41 U/L - - 42(H)  ALT 0 - 44 U/L - - 44   Assessment/Plan: (ICD-10's: K56.21) 84 y.o. malewith clinically improving likely possible gastroenteritis vs partial small bowel obstruction, unclear whether attributable to post-surgical adhesions following only prior laparoscopic abdominal surgeries and history of prostate cancer, complicated by improving AKI (likely attributable at least in part to hypovolemia) and by pertinent comorbidities including advanced chronological age, HTN, HLD, CAD s/p remote PCI without stent for MI, CKD with solitary kidney, hypothyroidism, and vertigo.   - NPO + IVF  - Continue NGT decompression and monitor output  - Monitor abdominal examination and on-going bowel function  - Pain control PRN (minimize narcotics), Antiemetics PRN              - surgical intervention if doesn't improve was also discussed, as was possible subsequent workup considering history of malignancy without prior open abdominal surgery             - no indication for daily abdominal x-rays or antibiotics, will follow closely along with admitting medical team - medical management comorbidities as per medical team - ambulation encouraged - DVT prophylaxis  All of the above findings and recommendations were discussed with the patient, patient's family, and the medical team, and all of patient's and family's questions were answered to their expressed satisfaction.  -- Edison Simon, PA-C [facility] 11/29/2018, 7:30 AM [phone] M-F: 7am - 4pm

## 2018-11-30 LAB — BASIC METABOLIC PANEL
Anion gap: 7 (ref 5–15)
BUN: 75 mg/dL — ABNORMAL HIGH (ref 8–23)
CO2: 23 mmol/L (ref 22–32)
Calcium: 8.6 mg/dL — ABNORMAL LOW (ref 8.9–10.3)
Chloride: 110 mmol/L (ref 98–111)
Creatinine, Ser: 1.83 mg/dL — ABNORMAL HIGH (ref 0.61–1.24)
GFR calc Af Amer: 39 mL/min — ABNORMAL LOW (ref >60)
GFR calc non Af Amer: 34 mL/min — ABNORMAL LOW (ref >60)
Glucose, Bld: 143 mg/dL — ABNORMAL HIGH (ref 70–99)
Potassium: 4.4 mmol/L (ref 3.5–5.1)
Sodium: 140 mmol/L (ref 135–145)

## 2018-11-30 LAB — CBC
HCT: 41.1 % (ref 39.0–52.0)
Hemoglobin: 13.6 g/dL (ref 13.0–17.0)
MCH: 31.3 pg (ref 26.0–34.0)
MCHC: 33.1 g/dL (ref 30.0–36.0)
MCV: 94.7 fL (ref 80.0–100.0)
Platelets: 252 10*3/uL (ref 150–400)
RBC: 4.34 MIL/uL (ref 4.22–5.81)
RDW: 12.4 % (ref 11.5–15.5)
WBC: 8.4 10*3/uL (ref 4.0–10.5)
nRBC: 0 % (ref 0.0–0.2)

## 2018-11-30 LAB — GLUCOSE, CAPILLARY: Glucose-Capillary: 119 mg/dL — ABNORMAL HIGH (ref 70–99)

## 2018-11-30 MED ORDER — PHENOL 1.4 % MT LIQD
1.0000 | OROMUCOSAL | Status: DC | PRN
  Administered 2018-11-30: 1 via OROMUCOSAL
  Filled 2018-11-30: qty 177

## 2018-11-30 MED ORDER — LATANOPROST 0.005 % OP SOLN
1.0000 [drp] | Freq: Every day | OPHTHALMIC | Status: DC
  Administered 2018-11-30 – 2018-12-06 (×4): 1 [drp] via OPHTHALMIC
  Filled 2018-11-30: qty 2.5

## 2018-11-30 NOTE — Progress Notes (Addendum)
SURGICAL PROGRESS NOTE (cpt: [phone])  Patient seen and examined as described below with surgical PA-C, Ardell Isaacs.  Assessment/Plan: (ICD-10's: K56.21) In summary, patient is an 84 y.o.malewithsmall bowel obstruction,unclear whetherattributable to post-surgical adhesions following only prior laparoscopic abdominal surgeries and history of prostate cancer, complicated byimproving AKI (likely attributable at least in part to hypovolemia with a solitary kidney) and bypertinent comorbidities including advanced chronological age, HTN, HLD, CAD s/p remote PCI without stent for MI, CKD with solitary kidney, hypothyroidism, and vertigo.              - nasogastric decompression             - NPO + IV fluids (losenges and chloraseptic spray ok)             - continue to monitor abdominal exam, bowel function, and NG tube drainage  - if no flatus tomorrow and/or NG tube drainage not less, will plan for gastrografin study via NG tube - surgical intervention if doesn't improve was also discussed, as was possible subsequent workup considering history of malignancy without prior open abdominal surgery - no indicationfor daily abdominal x-rays or antibiotics, following closely with primary medical team - medical management comorbidities as per medical team - DVT prophylaxis, ambulation encouraged  I have personally reviewed the patient's chart, evaluated/examined the patient, proposed the recommended management, and discussed these recommendations with the patient and his family to their expressed satisfaction as well as with patient's RN.  Thank you for the opportunity to participate in this patient's care.  -- Marilynne Drivers Rosana Hoes, MD, [place]: [facility] - Partnering for exceptional care. Office: [facility] SURGICAL PROGRESS NOTE (cpt [phone])  Hospital Day(s): 2.    Post op day(s):  Marland Kitchen   Interval History: Patient seen and examined, no acute events or new complaints overnight. Patient reports he still has mild abdominal pain but this is drastically improved since presentation. He denied any fever, chills, nausea, or emesis. He denied any flatus. NGT with 1250 ccs out per chart review.    Review of Systems:  Constitutional: denies fever, chills  Respiratory: denies any shortness of breath  Cardiovascular: denies chest pain or palpitations  Gastrointestinal: denies abdominal pain, N/V, or diarrhea/and bowel function as per interval history Genitourinary: denies burning with urination or urinary frequency  Vital signs in last 24 hours: [min-max] current  Temp:  [98.5 F (36.9 C)-98.8 F (37.1 C)] 98.5 F (36.9 C) (01/08 1139) Pulse Rate:  [55-68] 56 (01/08 1139) Resp:  [16-18] 18 (01/08 1139) BP: (111-131)/(60-71) 111/60 (01/08 1139) SpO2:  [96 %-97 %] 96 % (01/08 1139) Weight:  [59.8 kg] 59.8 kg (01/08 0500)     Height: 5\' 9"  (175.3 cm) Weight: 59.8 kg BMI (Calculated): 19.45   Intake/Output this shift:  Total I/O In: 318.8 [I.V.:318.8] Out: -    Intake/Output last 2 shifts:  @IOLAST2SHIFTS @   Physical Exam:  Constitutional: alert, cooperative and no distress  HENT: normocephalic without obvious abnormality, NGT in place  Eyes: EOM's grossly intact and symmetric  Respiratory: breathing non-labored at rest  Gastrointestinal: soft, non-tender, and non-distended Musculoskeletal: no edema or wounds, motor and sensation grossly intact, NT    Labs:  CBC Latest Ref Rng & Units 11/30/2018 11/29/2018 11/28/2018  WBC 4.0 - 10.5 K/uL 8.4 10.1 -  Hemoglobin 13.0 - 17.0 g/dL 13.6 13.9 14.6  Hematocrit 39.0 - 52.0 % 41.1 41.1 43.0  Platelets 150 - 400 K/uL 252 226 -  CMP Latest Ref Rng & Units 11/30/2018 11/29/2018 11/28/2018  Glucose 70 - 99 mg/dL 143(H) 168(H) 150(H)  BUN 8 - 23 mg/dL 75(H) 83(H) 81(H)  Creatinine 0.61 - 1.24 mg/dL 1.83(H) 1.91(H)  2.20(H)  Sodium 135 - 145 mmol/L 140 134(L) 135  Potassium 3.5 - 5.1 mmol/L 4.4 4.6 4.9  Chloride 98 - 111 mmol/L 110 101 100  CO2 22 - 32 mmol/L 23 23 -  Calcium 8.9 - 10.3 mg/dL 8.6(L) 8.5(L) -  Total Protein 6.5 - 8.1 g/dL - - -  Total Bilirubin 0.3 - 1.2 mg/dL - - -  Alkaline Phos 38 - 126 U/L - - -  AST 15 - 41 U/L - - -  ALT 0 - 44 U/L - - -   Assessment/Plan: (ICD-10's: K70.21) 84 y.o. male clinically but not resolved likelypossible gastroenteritis vs partial small bowel obstruction,unclear whetherattributable to post-surgical adhesions following only prior laparoscopic abdominal surgeries and history of prostate cancer, complicated byimproving AKI (likely attributable at least in part to hypovolemia) and bypertinent comorbidities including advanced chronological age, HTN, HLD, CAD s/p remote PCI without stent for MI, CKD with solitary kidney, hypothyroidism, and vertigo.   - Continue NPO + IVF   - Continue NGT decompression and monitor output while awaiting return of bowel function  - Monitor abdominal examination and on-going bowel function   - Pain control PRN (minimize narcotics), Antiemetics PRN   - surgical intervention if doesn't improve was also discussed, as was possible subsequent workup considering history of malignancy without prior open abdominal surgery   - no indication for daily abdominal x-rays or antibiotics, will follow closely along with admitting medical team   - medical management comorbidities as per medical team   - ambulation encouraged   - DVT prophylaxis  All of the above findings and recommendations were discussed with the patient, patient's family, and the medical team, and all of patient's and family's questions were answered to their expressed satisfaction.   -- Edison Simon, PA-C [facility] 11/30/2018, 11:41 AM [phone] M-F: 7am - 4pm

## 2018-11-30 NOTE — Progress Notes (Signed)
[facility] [facility] at [place] NAME: Daniel Lewis    MR#:  [id number]  DATE OF BIRTH:  February 08, 1935  SUBJECTIVE:   [place]ino pain has improved however not passing gas.  Still has high output from NG tube.  REVIEW OF SYSTEMS:    Review of Systems  Constitutional: Negative for fever, chills weight loss HENT: Negative for ear pain, nosebleeds, congestion, facial swelling, rhinorrhea, neck pain, neck stiffness and ear discharge.   Respiratory: Negative for cough, shortness of breath, wheezing  Cardiovascular: Negative for chest pain, palpitations and leg swelling.  Gastrointestinal: Negative for heartburn, abdominal pain, vomiting, diarrhea or consitpation Genitourinary: Negative for dysuria, urgency, frequency, hematuria Musculoskeletal: Negative for back pain or joint pain Neurological: Negative for dizziness, seizures, syncope, focal weakness,  numbness and headaches.  Hematological: Does not bruise/bleed easily.  Psychiatric/Behavioral: Negative for hallucinations, confusion, dysphoric mood    Tolerating Diet: npo      DRUG ALLERGIES:  No Known Allergies  VITALS:  Blood pressure 121/60, pulse (!) 55, temperature 98.8 F (37.1 C), temperature source Oral, resp. rate 16, height 5\' 9"  (1.753 m), weight 59.8 kg, SpO2 96 %.  PHYSICAL EXAMINATION:  Constitutional: Appears well-developed and well-nourished. No distress. HENT: Normocephalic. . NG tube placed  eyes: Conjunctivae and EOM are normal. PERRLA, no scleral icterus.  Neck: Normal ROM. Neck supple. No JVD. No tracheal deviation. CVS: RRR, S1/S2 +, no murmurs, no gallops, no carotid bruit.  Pulmonary: Effort and breath sounds normal, no stridor, rhonchi, wheezes, rales.  Abdominal: Soft.  Hypoactive bowel sounds minimally distended no tenderness, rebound or guarding.  Musculoskeletal: Normal range of motion. No edema and no tenderness.  Neuro: Alert. CN 2-12 grossly intact. No focal  deficits. Skin: Skin is warm and dry. No rash noted. Psychiatric: Normal mood and affect.      LABORATORY PANEL:   CBC Recent Labs  Lab 11/30/18 0402  WBC 8.4  HGB 13.6  HCT 41.1  PLT 252   ------------------------------------------------------------------------------------------------------------------  Chemistries  Recent Labs  Lab 11/28/18 1343  11/30/18 0402  NA 133*   < > 140  K 5.0   < > 4.4  CL 96*   < > 110  CO2 23   < > 23  GLUCOSE 204*   < > 143*  BUN 78*   < > 75*  CREATININE 2.39*   < > 1.83*  CALCIUM 9.3   < > 8.6*  AST 42*  --   --   ALT 44  --   --   ALKPHOS 73  --   --   BILITOT 1.4*  --   --    < > = values in this interval not displayed.   ------------------------------------------------------------------------------------------------------------------  Cardiac Enzymes No results for input(s): TROPONINI in the last 168 hours. ------------------------------------------------------------------------------------------------------------------  RADIOLOGY:  Ct Abdomen Pelvis Wo Contrast  Result Date: 11/28/2018 CLINICAL DATA:  Vomiting and abdominal pain EXAM: CT ABDOMEN AND PELVIS WITHOUT CONTRAST TECHNIQUE: Multidetector CT imaging of the abdomen and pelvis was performed following the standard protocol without IV contrast. COMPARISON:  None. FINDINGS: LOWER CHEST: There is no basilar pleural or apical pericardial effusion. HEPATOBILIARY: The hepatic contours and density are normal. There is no intra- or extrahepatic biliary dilatation. The gallbladder is normal. PANCREAS: The pancreatic parenchymal contours are normal and there is no ductal dilatation. There is no peripancreatic fluid collection. SPLEEN: Normal. ADRENALS/URINARY TRACT: --Adrenal glands: Normal. --Right kidney/ureter: Multiple nonobstructing renal calculi, measuring up to 5 mm.  No hydronephrosis, perinephric stranding or solid renal mass. Interpolar renal cyst measures 3.1 cm, increased  since 2009. --Left kidney/ureter: Absent --Urinary bladder: Normal for degree of distention STOMACH/BOWEL: --Stomach/Duodenum: There is no hiatal hernia or other gastric abnormality. The duodenal course and caliber are normal. --Small bowel: There is proximal small bowel dilatation with multiple fluid-filled loops of jejunum. There is a transition point in the low mid abdomen (sagittal image 51). The ileum is decompressed. --Colon: No focal abnormality. --Appendix: Normal. VASCULAR/LYMPHATIC: There is aortic atherosclerosis without hemodynamically significant stenosis. No abdominal or pelvic lymphadenopathy. REPRODUCTIVE: Status post prostatectomy. MUSCULOSKELETAL. No bony spinal canal stenosis or focal osseous abnormality. OTHER: None. IMPRESSION: 1. Small bowel obstruction with suspected transition point in the low mid abdomen. 2. Nonobstructing right nephrolithiasis. Aortic Atherosclerosis (ICD10-I70.0). Electronically Signed   By: Ulyses Jarred M.D.   On: 11/28/2018 16:28     ASSESSMENT AND PLAN:   84 year old male with history of chronic kidney disease stage III with solitary kidney and hypertension who presented to the emergency room with abdominal pain.  1.  Abdominal pain with partial small bowel obstruction: Continue management as per surgery Symptoms improving Continue NG tube for decompression No plans for immediate surgery If patient were to have surgery he prefers [facility] surgical services.   2.  Hypertension: Continue atenolol  3.  Hypothyroidism: Continue Synthroid  4.  Hyperlipidemia: Continue statin  5.  Chronic kidney disease stage III with solitary kidney: Creatinine seems to be improving with IV fluids Hold nephrotoxic medications   D/w surgery this am  Management plans discussed with the patient and wife and they arein agreement.  CODE STATUS: Full  TOTAL TIME TAKING CARE OF THIS PATIENT: 25 minutes.     POSSIBLE D/C for 3 days, DEPENDING ON CLINICAL  CONDITION.   Jaislyn Blinn M.D on 11/30/2018 at 11:13 AM  Between 7am to 6pm - Pager - [phone] After 6pm go to [email or web address] - password [email or web address] [facility]  Office  [phone]  CC: Primary care physician; Idelle Crouch, MD  Note: This dictation was prepared with Dragon dictation along with smaller phrase technology. Any transcriptional errors that result from this process are unintentional.

## 2018-12-01 ENCOUNTER — Inpatient Hospital Stay: Payer: PPO

## 2018-12-01 LAB — BASIC METABOLIC PANEL
Anion gap: 7 (ref 5–15)
BUN: 65 mg/dL — ABNORMAL HIGH (ref 8–23)
CO2: 24 mmol/L (ref 22–32)
Calcium: 8.2 mg/dL — ABNORMAL LOW (ref 8.9–10.3)
Chloride: 114 mmol/L — ABNORMAL HIGH (ref 98–111)
Creatinine, Ser: 1.61 mg/dL — ABNORMAL HIGH (ref 0.61–1.24)
GFR calc Af Amer: 46 mL/min — ABNORMAL LOW (ref >60)
GFR calc non Af Amer: 40 mL/min — ABNORMAL LOW (ref >60)
Glucose, Bld: 128 mg/dL — ABNORMAL HIGH (ref 70–99)
Potassium: 3.9 mmol/L (ref 3.5–5.1)
Sodium: 145 mmol/L (ref 135–145)

## 2018-12-01 LAB — GLUCOSE, CAPILLARY: Glucose-Capillary: 102 mg/dL — ABNORMAL HIGH (ref 70–99)

## 2018-12-01 MED ORDER — ACETAMINOPHEN 500 MG PO TABS: 1000.0000 mg | ORAL_TABLET | ORAL | Status: DC

## 2018-12-01 MED ORDER — IOPAMIDOL (ISOVUE-300) INJECTION 61%: 30.0000 mL | Freq: Once | INTRAVENOUS | Status: DC | PRN

## 2018-12-01 MED ORDER — IOHEXOL 300 MG/ML  SOLN
75.0000 mL | Freq: Once | INTRAMUSCULAR | Status: AC | PRN
  Administered 2018-12-01: 75 mL via INTRAVENOUS

## 2018-12-01 MED ORDER — CHLORHEXIDINE GLUCONATE CLOTH 2 % EX PADS
6.0000 | MEDICATED_PAD | Freq: Once | CUTANEOUS | Status: AC
  Administered 2018-12-02: 6 via TOPICAL

## 2018-12-01 MED ORDER — CHLORHEXIDINE GLUCONATE CLOTH 2 % EX PADS: 6.0000 | MEDICATED_PAD | Freq: Once | CUTANEOUS | Status: DC

## 2018-12-01 MED ORDER — CEFAZOLIN SODIUM-DEXTROSE 2-4 GM/100ML-% IV SOLN
2.0000 g | INTRAVENOUS | Status: AC
  Administered 2018-12-02: 2 g via INTRAVENOUS
  Filled 2018-12-01: qty 100

## 2018-12-01 MED ORDER — ENOXAPARIN SODIUM 40 MG/0.4ML ~~LOC~~ SOLN
40.0000 mg | SUBCUTANEOUS | Status: DC
  Administered 2018-12-02 – 2018-12-07 (×6): 40 mg via SUBCUTANEOUS
  Filled 2018-12-01 (×7): qty 0.4

## 2018-12-01 NOTE — Care Management Important Message (Signed)
Copy of signed Medicare IM left with patient in room. 

## 2018-12-01 NOTE — Progress Notes (Addendum)
[place] at [place] NAME: Daniel Lewis    MR#:  [id number]  DATE OF BIRTH:  Mar 13, 1935  SUBJECTIVE:  Patient's pain is better but still not passing gas.  Her CT scan today still has  NG tube.  REVIEW OF SYSTEMS:    Review of Systems  Constitutional: Negative for fever, chills weight loss HENT: Negative for ear pain, nosebleeds, congestion, facial swelling, rhinorrhea, neck pain, neck stiffness and ear discharge.   Respiratory: Negative for cough, shortness of breath, wheezing  Cardiovascular: Negative for chest pain, palpitations and leg swelling.  Gastrointestinal: Negative for heartburn, abdominal pain, vomiting, diarrhea or consitpation Genitourinary: Negative for dysuria, urgency, frequency, hematuria Musculoskeletal: Negative for back pain or joint pain Neurological: Negative for dizziness, seizures, syncope, focal weakness,  numbness and headaches.  Hematological: Does not bruise/bleed easily.  Psychiatric/Behavioral: Negative for hallucinations, confusion, dysphoric mood    Tolerating Diet: npo      DRUG ALLERGIES:  No Known Allergies  VITALS:  Blood pressure (!) 149/59, pulse (!) 57, temperature 98.3 F (36.8 C), resp. rate 20, height 5\' 9"  (1.753 m), weight 59.8 kg, SpO2 98 %.  PHYSICAL EXAMINATION:  Constitutional: Appears well-developed and well-nourished. No distress. HENT: Normocephalic. . NG tube intact connected to suction eyes: Conjunctivae and EOM are normal. PERRLA, no scleral icterus.  Neck: Normal ROM. Neck supple. No JVD. No tracheal deviation. CVS: RRR, S1/S2 +, no murmurs, no gallops, no carotid bruit.  Pulmonary: Effort and breath sounds normal, no stridor, rhonchi, wheezes, rales.  Abdominal: Soft.  Hypoactive bowel sounds minimally distended no tenderness, rebound or guarding.  Musculoskeletal: Normal range of motion. No edema and no tenderness.  Neuro: Alert. CN 2-12 grossly intact. No focal  deficits. Skin: Skin is warm and dry. No rash noted. Psychiatric: Normal mood and affect.      LABORATORY PANEL:   CBC Recent Labs  Lab 11/30/18 0402  WBC 8.4  HGB 13.6  HCT 41.1  PLT 252   ------------------------------------------------------------------------------------------------------------------  Chemistries  Recent Labs  Lab 11/28/18 1343  12/01/18 0353  NA 133*   < > 145  K 5.0   < > 3.9  CL 96*   < > 114*  CO2 23   < > 24  GLUCOSE 204*   < > 128*  BUN 78*   < > 65*  CREATININE 2.39*   < > 1.61*  CALCIUM 9.3   < > 8.2*  AST 42*  --   --   ALT 44  --   --   ALKPHOS 73  --   --   BILITOT 1.4*  --   --    < > = values in this interval not displayed.   ------------------------------------------------------------------------------------------------------------------  Cardiac Enzymes No results for input(s): TROPONINI in the last 168 hours. ------------------------------------------------------------------------------------------------------------------  RADIOLOGY:  No results found.   ASSESSMENT AND PLAN:   84 year old male with history of chronic kidney disease stage III with solitary kidney and hypertension who presented to the emergency room with abdominal pain.  1.  Abdominal pain with partial small bowel obstruction: Continue management as per surgery Symptoms improving Continue NG tube for decompression No plans for immediate surgery If patient were to have surgery he prefers [facility] surgical services.   2.  Acute kidney injury on chronic kidney disease stage III with solitary kidney-clinically improving with IV fluids Avoid nephrotoxins Creatinine 2.39 -1.6  3. hypertension: Continue atenolol  4.  Hypothyroidism: Continue Synthroid  5  Hyperlipidemia: Continue statin     D/w surgery this am  Management plans discussed with the patient and son at bedside and they arein agreement.  CODE STATUS: Full  TOTAL TIME TAKING CARE OF THIS  PATIENT: 35 minutes.     POSSIBLE D/C for 2-3  days, DEPENDING ON CLINICAL CONDITION.   [name] M.D on 12/01/2018 at 2:43 PM  Between 7am to 6pm - Pager - [phone] After 6pm go to [email or web address] - password [email or web address] [facility]  Office  [phone]  CC: Primary care physician; Idelle Crouch, MD  Note: This dictation was prepared with Dragon dictation along with smaller phrase technology. Any transcriptional errors that result from this process are unintentional.

## 2018-12-01 NOTE — Progress Notes (Addendum)
SURGICAL PROGRESS NOTE (cpt: [phone])  Patient seen and examined as described below with surgical PA-C, Ardell Isaacs.  Assessment/Plan:(ICD-10's: K56.21) In summary, patient is an83 y.o.malewithsmall bowelobstruction,unclear whetherattributable to post-surgical adhesions following only laparoscopic abdominal surgeries and history of prostate cancer, complicated byimprovingAKI (likely attributable at least in part to hypovolemia with a solitary kidney) and bypertinent comorbidities including advanced chronological age, HTN, HLD, CAD s/p remote PCI without stent for MI, CKD with solitary kidney, hypothyroidism, and vertigo.  - nasogastric decompression - NPO + IV fluids (losenges and chloraseptic spray ok) - continue to monitor abdominal exam, bowel function, and NG tube drainage             - CT results reviewed with patient and his family, demonstrate persistent SBO with contrast not reaching colon, but also not reaching transition point from dilated to non-dilated small intestine either - all risks, benefits, and alternatives to laparotomy with lysis of adhesions, possible small bowel resection, were discussed with the patient and his family, all of their questions were answered to their expressed satisfaction, patient expresses he wishes to proceed, and informed consent was obtained.  - patient/family request that either Dr. Genevive Bi or one of [facility] surgeons assist with surgery due to patient's familiarity with both - no indicationfor daily abdominal x-rays or antibiotics, following closely with primary medical team  - surgery scheduled for tomorrow pending OR and requested assistants' availability - medical management comorbidities as per medical team - DVT prophylaxis, ambulation encouraged  I have personally reviewed the patient's chart, evaluated/examined the patient, proposed the recommended  management, and discussed these recommendations with the patient and his family to their expressed satisfaction as well as with patient's RN.  Thank you for the opportunity to participate in this patient's care.  -- Marilynne Drivers Rosana Hoes, MD, [place]: [facility] - Partnering for exceptional care. Office: [facility] SURGICAL PROGRESS NOTE (cpt [phone])  Hospital Day(s): 3.   Post op day(s):  Marland Kitchen   Interval History: Patient seen and examined, no acute events or new complaints overnight. Patient reports that he continues to not pass flatus. He notices some abdominal discomfort in his l;ower abdomen but overall feels improved from presentation. He denies fever, chills, nausea, or emesis. He remains NPO.   Review of Systems:  Constitutional: denies fever, chills  Respiratory: denies any shortness of breath  Cardiovascular: denies chest pain or palpitations  Gastrointestinal: + abdominal pain, denied N/V, or diarrhea/and bowel function as per interval history Genitourinary: denies burning with urination or urinary frequency   Vital signs in last 24 hours: [min-max] current  Temp:  [98.5 F (36.9 C)-98.8 F (37.1 C)] 98.7 F (37.1 C) (01/09 0623) Pulse Rate:  [56-62] 57 (01/09 0623) Resp:  [18] 18 (01/09 0623) BP: (111-126)/(60-61) 126/61 (01/09 0623) SpO2:  [96 %-98 %] 97 % (01/09 0623)     Height: 5\' 9"  (175.3 cm) Weight: 59.8 kg BMI (Calculated): 19.45   Intake/Output this shift:  Total I/O In: -  Out: 325 [Urine:325]   Intake/Output last 2 shifts:  @IOLAST2SHIFTS @    Physical Exam:  Constitutional: alert, cooperative and no distress  HENT: normocephalic without obvious abnormality, NGT in place  Eyes: EOM's grossly intact and symmetric  Respiratory: breathing non-labored at rest  Gastrointestinal: soft, non-tender, and non-distended Musculoskeletal: no edema or wounds, motor and sensation grossly  intact, NT    Labs:  CBC Latest Ref Rng & Units 11/30/2018 11/29/2018 11/28/2018  WBC 4.0 - 10.5  K/uL 8.4 10.1 -  Hemoglobin 13.0 - 17.0 g/dL 13.6 13.9 14.6  Hematocrit 39.0 - 52.0 % 41.1 41.1 43.0  Platelets 150 - 400 K/uL 252 226 -   CMP Latest Ref Rng & Units 12/01/2018 11/30/2018 11/29/2018  Glucose 70 - 99 mg/dL 128(H) 143(H) 168(H)  BUN 8 - 23 mg/dL 65(H) 75(H) 83(H)  Creatinine 0.61 - 1.24 mg/dL 1.61(H) 1.83(H) 1.91(H)  Sodium 135 - 145 mmol/L 145 140 134(L)  Potassium 3.5 - 5.1 mmol/L 3.9 4.4 4.6  Chloride 98 - 111 mmol/L 114(H) 110 101  CO2 22 - 32 mmol/L 24 23 23   Calcium 8.9 - 10.3 mg/dL 8.2(L) 8.6(L) 8.5(L)  Total Protein 6.5 - 8.1 g/dL - - -  Total Bilirubin 0.3 - 1.2 mg/dL - - -  Alkaline Phos 38 - 126 U/L - - -  AST 15 - 41 U/L - - -  ALT 0 - 44 U/L - - -    Assessment/Plan: (ICD-10's: K82.21) 84 y.o. male with stable but not improving SBO, unclear whetherattributable to post-surgical adhesions following only prior laparoscopic abdominal surgeries and history of prostate cancer, complicated byimprovingAKI (likely attributable at least in part to hypovolemia with a solitary kidney) and bypertinent comorbidities including advanced chronological age, HTN, HLD, CAD s/p remote PCI without stent for MI, CKD with solitary kidney, hypothyroidism, and vertigo.   - Nasogastric decompression - NPO + IV fluids (losenges and chloraseptic spray ok) - continue to monitor abdominal exam, bowel function, and NG tube drainage   - Will obtain CT with gastrografin contrast today to further assess possible SBO   - Pain control PRN (minimize narcotics), Antiemetics PRN              - surgical intervention if doesn't improve was also discussed, as was possible subsequent workup considering history of malignancy without prior open abdominal surgery              - medical management comorbidities as per medical team              - ambulation encouraged              - DVT  prophylaxis  All of the above findings and recommendations were discussed with the patient, patient's family, and the medical team, and all of patient's and family's questions were answered to their expressed satisfaction.  -- Edison Simon, PA-C [facility] 12/01/2018, 8:28 AM [phone] M-F: 7am - 4pm

## 2018-12-01 NOTE — Progress Notes (Signed)
PHARMACIST - PHYSICIAN COMMUNICATION  CONCERNING:  Enoxaparin (Lovenox) for DVT Prophylaxis    RECOMMENDATION: Patient was prescribed enoxaprin 40mg  q24 hours for VTE prophylaxis.   Filed Weights   11/28/18 1336 11/28/18 2156 11/30/18 0500  Weight: 140 lb (63.5 kg) 134 lb 7.7 oz (61 kg) 131 lb 12.8 oz (59.8 kg)    Body mass index is 19.46 kg/m.  Estimated Creatinine Clearance: 30.4 mL/min (A) (by C-G formula based on SCr of 1.61 mg/dL (H)).   Based on [place] patient was transitioned on 01/06 to enoxaparin 30mg  every 24 hours Due to AKI, which is resolving. Patient is now a candidate for enoxaparin 40mg  every 24 hours based on CrCl >23ml/min and Weight > 50kg for males  DESCRIPTION: Pharmacy has adjusted enoxaparin dose per [facility] policy.  Patient is now receiving enoxaparin 40mg  every 24 hours.   Dallie Piles, PharmD Clinical Pharmacist  12/01/2018 7:10 AM

## 2018-12-02 ENCOUNTER — Inpatient Hospital Stay: Payer: PPO | Admitting: Anesthesiology

## 2018-12-02 ENCOUNTER — Encounter: Payer: Self-pay | Admitting: *Deleted

## 2018-12-02 ENCOUNTER — Encounter
Admission: EM | Disposition: A | Discharge status: Home / Self Care | Payer: Self-pay | Source: Home / Self Care | Attending: Internal Medicine

## 2018-12-02 DIAGNOSIS — K56609 Unspecified intestinal obstruction, unspecified as to partial versus complete obstruction: Secondary | ICD-10-CM

## 2018-12-02 HISTORY — PX: LAPAROTOMY: SHX154

## 2018-12-02 LAB — GLUCOSE, CAPILLARY: Glucose-Capillary: 84 mg/dL (ref 70–99)

## 2018-12-02 SURGERY — LAPAROTOMY, EXPLORATORY
Anesthesia: General

## 2018-12-02 MED ORDER — BUPIVACAINE LIPOSOME 1.3 % IJ SUSP
INTRAMUSCULAR | Status: AC
  Filled 2018-12-02: qty 20

## 2018-12-02 MED ORDER — HYDROMORPHONE HCL 1 MG/ML IJ SOLN
INTRAMUSCULAR | Status: AC
  Administered 2018-12-02: 0.25 mg via INTRAVENOUS
  Filled 2018-12-02: qty 1

## 2018-12-02 MED ORDER — SODIUM CHLORIDE (PF) 0.9 % IJ SOLN
INTRAMUSCULAR | Status: AC
  Filled 2018-12-02: qty 50

## 2018-12-02 MED ORDER — ONDANSETRON HCL 4 MG/2ML IJ SOLN
INTRAMUSCULAR | Status: AC
  Filled 2018-12-02: qty 2

## 2018-12-02 MED ORDER — BUPIVACAINE-EPINEPHRINE 0.25% -1:200000 IJ SOLN
INTRAMUSCULAR | Status: DC | PRN
  Administered 2018-12-02: 30 mL

## 2018-12-02 MED ORDER — SUCCINYLCHOLINE CHLORIDE 20 MG/ML IJ SOLN
INTRAMUSCULAR | Status: DC | PRN
  Administered 2018-12-02: 80 mg via INTRAVENOUS

## 2018-12-02 MED ORDER — EPHEDRINE SULFATE 50 MG/ML IJ SOLN
INTRAMUSCULAR | Status: DC | PRN
  Administered 2018-12-02: 5 mg via INTRAVENOUS

## 2018-12-02 MED ORDER — LIDOCAINE HCL (PF) 2 % IJ SOLN
INTRAMUSCULAR | Status: AC
  Filled 2018-12-02: qty 10

## 2018-12-02 MED ORDER — SUGAMMADEX SODIUM 200 MG/2ML IV SOLN
INTRAVENOUS | Status: DC | PRN
  Administered 2018-12-02: 120 mg via INTRAVENOUS

## 2018-12-02 MED ORDER — PHENYLEPHRINE HCL 10 MG/ML IJ SOLN
INTRAMUSCULAR | Status: DC | PRN
  Administered 2018-12-02: 100 ug via INTRAVENOUS

## 2018-12-02 MED ORDER — EPHEDRINE SULFATE 50 MG/ML IJ SOLN
INTRAMUSCULAR | Status: AC
  Filled 2018-12-02: qty 1

## 2018-12-02 MED ORDER — BUPIVACAINE LIPOSOME 1.3 % IJ SUSP
INTRAMUSCULAR | Status: DC | PRN
  Administered 2018-12-02: 20 mL

## 2018-12-02 MED ORDER — ROCURONIUM BROMIDE 100 MG/10ML IV SOLN
INTRAVENOUS | Status: DC | PRN
  Administered 2018-12-02: 40 mg via INTRAVENOUS

## 2018-12-02 MED ORDER — FENTANYL CITRATE (PF) 100 MCG/2ML IJ SOLN
INTRAMUSCULAR | Status: AC
  Filled 2018-12-02: qty 2

## 2018-12-02 MED ORDER — PROPOFOL 10 MG/ML IV BOLUS
INTRAVENOUS | Status: AC
  Filled 2018-12-02: qty 20

## 2018-12-02 MED ORDER — BUPIVACAINE HCL (PF) 0.5 % IJ SOLN
INTRAMUSCULAR | Status: AC
  Filled 2018-12-02: qty 30

## 2018-12-02 MED ORDER — SUGAMMADEX SODIUM 200 MG/2ML IV SOLN
INTRAVENOUS | Status: AC
  Filled 2018-12-02: qty 2

## 2018-12-02 MED ORDER — BUPIVACAINE-EPINEPHRINE (PF) 0.25% -1:200000 IJ SOLN
INTRAMUSCULAR | Status: AC
  Filled 2018-12-02: qty 30

## 2018-12-02 MED ORDER — DEXAMETHASONE SODIUM PHOSPHATE 10 MG/ML IJ SOLN
INTRAMUSCULAR | Status: DC | PRN
  Administered 2018-12-02: 10 mg via INTRAVENOUS

## 2018-12-02 MED ORDER — SODIUM CHLORIDE (PF) 0.9 % IJ SOLN
INTRAMUSCULAR | Status: DC | PRN
  Administered 2018-12-02: 50 mL via INTRAVENOUS

## 2018-12-02 MED ORDER — SUCCINYLCHOLINE CHLORIDE 20 MG/ML IJ SOLN
INTRAMUSCULAR | Status: AC
  Filled 2018-12-02: qty 1

## 2018-12-02 MED ORDER — BUPIVACAINE LIPOSOME 1.3 % IJ SUSP: 20.0000 mL | Freq: Once | INTRAMUSCULAR | Status: DC

## 2018-12-02 MED ORDER — METOPROLOL TARTRATE 5 MG/5ML IV SOLN: 5.0000 mg | INTRAVENOUS | Status: DC | PRN

## 2018-12-02 MED ORDER — DEXAMETHASONE SODIUM PHOSPHATE 10 MG/ML IJ SOLN
INTRAMUSCULAR | Status: AC
  Filled 2018-12-02: qty 1

## 2018-12-02 MED ORDER — ROCURONIUM BROMIDE 50 MG/5ML IV SOLN
INTRAVENOUS | Status: AC
  Filled 2018-12-02: qty 1

## 2018-12-02 MED ORDER — HYDROMORPHONE HCL 1 MG/ML IJ SOLN
0.2500 mg | INTRAMUSCULAR | Status: DC | PRN
  Administered 2018-12-02 (×3): 0.25 mg via INTRAVENOUS

## 2018-12-02 MED ORDER — PROPOFOL 10 MG/ML IV BOLUS
INTRAVENOUS | Status: DC | PRN
  Administered 2018-12-02: 30 mg via INTRAVENOUS
  Administered 2018-12-02: 70 mg via INTRAVENOUS

## 2018-12-02 MED ORDER — FENTANYL CITRATE (PF) 100 MCG/2ML IJ SOLN
INTRAMUSCULAR | Status: DC | PRN
  Administered 2018-12-02 (×3): 50 ug via INTRAVENOUS

## 2018-12-02 MED ORDER — LIDOCAINE HCL (CARDIAC) PF 100 MG/5ML IV SOSY
PREFILLED_SYRINGE | INTRAVENOUS | Status: DC | PRN
  Administered 2018-12-02: 60 mg via INTRAVENOUS

## 2018-12-02 SURGICAL SUPPLY — 60 items
APPLIER CLIP 11 MED OPEN (CLIP)
APPLIER CLIP 13 LRG OPEN (CLIP)
BAG BILE T-TUBES STRL (MISCELLANEOUS) IMPLANT
BARRIER ADH SEPRAFILM 3INX5IN (MISCELLANEOUS) ×4 IMPLANT
BARRIER SKIN 2 3/4 (OSTOMY) IMPLANT
BARRIER SKIN 2 3/4 INCH (OSTOMY)
BARRIER SKIN OD2.25 2 3/4 FLNG (OSTOMY) IMPLANT
CATH COUDE FOLEY 2W 5CC 16FR (CATHETERS) ×2 IMPLANT
CHLORAPREP W/TINT 26ML (MISCELLANEOUS) ×3 IMPLANT
CLAMP POUCH DRAINAGE QUIET (OSTOMY) IMPLANT
CLIP APPLIE 11 MED OPEN (CLIP) IMPLANT
CLIP APPLIE 13 LRG OPEN (CLIP) IMPLANT
COVER WAND RF STERILE (DRAPES) ×1 IMPLANT
DRAPE LAPAROTOMY 100X77 ABD (DRAPES) ×3 IMPLANT
DRSG OPSITE POSTOP 4X10 (GAUZE/BANDAGES/DRESSINGS) ×3 IMPLANT
ELECT BLADE 6 FLAT ULTRCLN (ELECTRODE) ×3 IMPLANT
ELECT BLADE 6.5 EXT (BLADE) ×2 IMPLANT
ELECT CAUTERY BLADE 6.4 (BLADE) ×2 IMPLANT
ELECT REM PT RETURN 9FT ADLT (ELECTROSURGICAL) ×3
ELECTRODE REM PT RTRN 9FT ADLT (ELECTROSURGICAL) ×1 IMPLANT
GAUZE SPONGE 4X4 12PLY STRL (GAUZE/BANDAGES/DRESSINGS) ×3 IMPLANT
GLOVE BIO SURGEON STRL SZ 6.5 (GLOVE) ×3 IMPLANT
GLOVE BIO SURGEON STRL SZ7 (GLOVE) ×4 IMPLANT
GLOVE BIO SURGEONS STRL SZ 6.5 (GLOVE) ×3
GLOVE BIOGEL PI IND STRL 7.5 (GLOVE) ×1 IMPLANT
GLOVE BIOGEL PI INDICATOR 7.5 (GLOVE) ×2
GLOVE ECLIPSE 7.0 STRL STRAW (GLOVE) ×3 IMPLANT
GOWN STRL REUS W/TWL LRG LVL3 (GOWN DISPOSABLE) ×9 IMPLANT
HANDLE SUCTION POOLE (INSTRUMENTS) ×1 IMPLANT
KIT TURNOVER KIT A (KITS) ×3 IMPLANT
NEEDLE HYPO 22GX1.5 SAFETY (NEEDLE) ×3 IMPLANT
NS IRRIG 1000ML POUR BTL (IV SOLUTION) ×3 IMPLANT
PACK BASIN MAJOR ARMC (MISCELLANEOUS) ×3 IMPLANT
PACK COLON CLEAN CLOSURE (MISCELLANEOUS) ×3 IMPLANT
RELOAD LINEAR CUT PROX 55 BLUE (ENDOMECHANICALS) IMPLANT
RELOAD PROXIMATE 75MM BLUE (ENDOMECHANICALS) IMPLANT
RELOAD STAPLE 55 3.8 BLU REG (ENDOMECHANICALS) IMPLANT
RELOAD STAPLE 75 3.8 BLU REG (ENDOMECHANICALS) IMPLANT
RETRACTOR WND ALEXIS-O 25 LRG (MISCELLANEOUS) IMPLANT
RETRACTOR WOUND ALXS 18CM MED (MISCELLANEOUS) IMPLANT
RTRCTR WOUND ALEXIS O 18CM MED (MISCELLANEOUS)
RTRCTR WOUND ALEXIS O 25CM LRG (MISCELLANEOUS)
STAPLER GUN LINEAR PROX 60 (STAPLE) IMPLANT
STAPLER PROXIMATE 55 BLUE (STAPLE) IMPLANT
STAPLER PROXIMATE 75MM BLUE (STAPLE) IMPLANT
STAPLER SKIN PROX 35W (STAPLE) ×2 IMPLANT
SUCTION POOLE HANDLE (INSTRUMENTS) ×3
SUT CHROMIC 0 SH (SUTURE) IMPLANT
SUT CHROMIC 2 0 SH (SUTURE) IMPLANT
SUT PDS #1 CTX NDL (SUTURE) ×1 IMPLANT
SUT PDS AB 0 CT1 27 (SUTURE) ×8 IMPLANT
SUT SILK 2 0 (SUTURE) ×2
SUT SILK 2 0 SH (SUTURE) ×4 IMPLANT
SUT SILK 2-0 18XBRD TIE 12 (SUTURE) ×1 IMPLANT
SUT SILK 3-0 (SUTURE) ×2
SUT SILK 3-0 SH-1 18XCR BRD (SUTURE) ×1
SUTURE SILK 3-0 SH-1 18XCR BRD (SUTURE) ×1 IMPLANT
SYR 10ML LL (SYRINGE) ×2 IMPLANT
SYR 20CC LL (SYRINGE) ×5 IMPLANT
TRAY FOLEY SLVR 16FR LF STAT (SET/KITS/TRAYS/PACK) ×3 IMPLANT

## 2018-12-02 NOTE — Progress Notes (Signed)
Preoperative Review   Patient is met in the preoperatively. The history is reviewed in the chart and with the patient. I personally reviewed the options and rationale as well as the risks of this procedure that have been previously discussed with the patient. All questions asked by the patient and/or family were answered to their satisfaction. I have personally reviewed his chart, I agree with surgical rx of SBO. D/w the pt in detail about the operation, risks, benefits and possible complications including but not limited to bleeding, infection, pain recurrence. Patient agrees to proceed with this procedure at this time.  Caroleen Hamman M.D. FACS

## 2018-12-02 NOTE — Op Note (Signed)
PROCEDURES: Laparotomy w Lysis of adhesions  Pre-operative Diagnosis:SBO  Post-operative Diagnosis: same  Surgeon: Marjory Lies Rmani Kellogg   Assistants: Otho Ket PA-C Required due to the complexity of the case and for adequate exposure Dr. Genevive Bi was present throughout the case  Anesthesia: General endotracheal anesthesia  ASA Class: 2  Surgeon: Caroleen Hamman , MD FACS  Anesthesia: Gen. with endotracheal tube  Findings: Single band causing obstruction. Transition point, no evidence of necrosis. Small area of mid SB with erythema  Left Inguinal hernia  Estimated Blood Loss: 50TU         Complications: none               Condition: stable  Procedure Details  The patient was seen again in the Holding Room. The benefits, complications, treatment options, and expected outcomes were discussed with the patient. The risks of bleeding, infection, recurrence of symptoms, failure to resolve symptoms,  bowel injury, any of which could require further surgery were reviewed with the patient.   The patient was taken to Operating Room, identified as Daniel Lewis and the procedure verified.  A Time Out was held and the above information confirmed.  Prior to the induction of general anesthesia, antibiotic prophylaxis was administered. VTE prophylaxis was in place. General endotracheal anesthesia was then administered and tolerated well. After the induction, the abdomen was prepped with Chloraprep and draped in the sterile fashion. The patient was positioned in the supine position.  Midline laparotomy performed with a 10 blade knife and electrocautery was used to incise the fascia. Upon  Entering the abdominal cavity there was a single band that was lysed upon eviscerating the bowel. There were two area of the small bowel that had a circumferential ring from the band and an area of erythema on the serosal layer, no evidence of necrosis and there was good perfusion to the bowel. The SB was run from the  ligament to the TI, no other pathology was noticed. There was a Left inguinal hernia.  THe SB was placed back to the abdominal cavity. Sepra film was placed. The Fascia was closed with a 0 PDS suture in a running fashion and the skin was closed with staplesl. Liposomal Marcaine was injected on all incision sites under direct visualization. Needle and laparotomy count were correct and there were no immediate occasions  Caroleen Hamman, MD, FACS

## 2018-12-02 NOTE — Transfer of Care (Signed)
Immediate Anesthesia Transfer of Care Note  Patient: Daniel Lewis  Procedure(s) Performed: EXPLORATORY LAPAROTOMY, LYSIS OF ADHESIONS, POSSIBLE SBO (N/A )  Patient Location: PACU  Anesthesia Type:General  Level of Consciousness: awake and drowsy  Airway & Oxygen Therapy: Patient Spontanous Breathing and Patient connected to face mask oxygen  Post-op Assessment: Report given to RN and Post -op Vital signs reviewed and stable  Post vital signs: stable  Last Vitals:  Vitals Value Taken Time  BP 125/56 12/02/2018  5:08 PM  Temp 36.9 C 12/02/2018  5:08 PM  Pulse 67 12/02/2018  5:18 PM  Resp 19 12/02/2018  5:18 PM  SpO2 98 % 12/02/2018  5:18 PM  Vitals shown include unvalidated device data.  Last Pain:  Vitals:   12/02/18 1708  TempSrc:   PainSc: Asleep         Complications: No apparent anesthesia complications

## 2018-12-02 NOTE — Progress Notes (Addendum)
No foley catheter was present after OR procedure.

## 2018-12-02 NOTE — Anesthesia Post-op Follow-up Note (Signed)
Anesthesia QCDR form completed.        

## 2018-12-02 NOTE — Anesthesia Procedure Notes (Signed)
Procedure Name: Intubation Date/Time: 12/02/2018 3:37 PM Performed by: Lavone Orn, CRNA Pre-anesthesia Checklist: Patient identified, Emergency Drugs available, Suction available, Patient being monitored and Timeout performed Patient Re-evaluated:Patient Re-evaluated prior to induction Oxygen Delivery Method: Circle system utilized Preoxygenation: Pre-oxygenation with 100% oxygen Induction Type: IV induction and Rapid sequence Laryngoscope Size: Mac and 4 Grade View: Grade II Tube type: Oral Number of attempts: 1 Airway Equipment and Method: Stylet Placement Confirmation: ETT inserted through vocal cords under direct vision,  positive ETCO2 and breath sounds checked- equal and bilateral Secured at: 23 cm Tube secured with: Tape Dental Injury: Teeth and Oropharynx as per pre-operative assessment

## 2018-12-02 NOTE — Anesthesia Preprocedure Evaluation (Signed)
Anesthesia Evaluation  Patient identified by MRN, date of birth, ID band Patient awake    Reviewed: Allergy & Precautions, H&P , NPO status , Patient's Chart, lab work & pertinent test results  Airway Mallampati: III  TM Distance: <3 FB     Dental  (+) Chipped, Missing   Pulmonary neg pulmonary ROS,           Cardiovascular hypertension, + CAD and + Past MI (20+ years ago)       Neuro/Psych negative neurological ROS  negative psych ROS   GI/Hepatic negative GI ROS, Neg liver ROS,   Endo/Other  Hypothyroidism   Renal/GU ARFRenal disease     Musculoskeletal   Abdominal   Peds  Hematology  (+) Blood dyscrasia, anemia ,   Anesthesia Other Findings Past Medical History: No date: Anemia No date: ASHD (arteriosclerotic heart disease) No date: Chronic kidney disease No date: Erectile dysfunction No date: Hyperlipidemia No date: Hypertension No date: Hypothyroidism No date: MI (myocardial infarction) ([place]) No date: Prostate cancer ([place]) No date: Vertigo  Past Surgical History: No date: CHOLECYSTECTOMY No date: COLONOSCOPY 12/16/2015: COLONOSCOPY WITH PROPOFOL; N/A     Comment:  Procedure: COLONOSCOPY WITH PROPOFOL;  Surgeon: Manya Silvas, MD;  Location: [facility] END[facility]COPY;  Service:               Endoscopy;  Laterality: N/A; No date: INGUINAL HERNIA REPAIR No date: PROSTATECTOMY  BMI    Body Mass Index:  19.46 kg/m      Reproductive/Obstetrics negative OB ROS                             Anesthesia Physical Anesthesia Plan  ASA: III  Anesthesia Plan: General ETT   Post-op Pain Management:    Induction:   PONV Risk Score and Plan: Ondansetron, Dexamethasone, Midazolam and Treatment may vary due to age or medical condition  Airway Management Planned:   Additional Equipment:   Intra-op Plan:   Post-operative Plan:   Informed Consent: I have reviewed the  patients History and Physical, chart, labs and discussed the procedure including the risks, benefits and alternatives for the proposed anesthesia with the patient or authorized representative who has indicated his/her understanding and acceptance.   Dental Advisory Given  Plan Discussed with: Anesthesiologist, CRNA and Surgeon  Anesthesia Plan Comments:         Anesthesia Quick Evaluation

## 2018-12-02 NOTE — Progress Notes (Signed)
[place] at [place] NAME: Pape Parson    MR#:  [id number]  DATE OF BIRTH:  [birth year]  SUBJECTIVE:  Patient's pain is better but still not passing gas.  CT scan still is showing bowel obstruction, scheduled for surgery today  REVIEW OF SYSTEMS:    Review of Systems  Constitutional: Negative for fever, chills weight loss HENT: Negative for ear pain, nosebleeds, congestion, facial swelling, rhinorrhea, neck pain, neck stiffness and ear discharge.   Respiratory: Negative for cough, shortness of breath, wheezing  Cardiovascular: Negative for chest pain, palpitations and leg swelling.  Gastrointestinal: Negative for heartburn, abdominal pain, vomiting, diarrhea or consitpation Genitourinary: Negative for dysuria, urgency, frequency, hematuria Musculoskeletal: Negative for back pain or joint pain Neurological: Negative for dizziness, seizures, syncope, focal weakness,  numbness and headaches.  Hematological: Does not bruise/bleed easily.  Psychiatric/Behavioral: Negative for hallucinations, confusion, dysphoric mood    Tolerating Diet: npo      DRUG ALLERGIES:  No Known Allergies  VITALS:  Blood pressure (!) 150/66, pulse 64, temperature 98.8 F (37.1 C), temperature source Temporal, resp. rate 18, height 5\' 9"  (1.753 m), weight 59.8 kg, SpO2 98 %.  PHYSICAL EXAMINATION:  Constitutional: Appears well-developed and well-nourished. No distress. HENT: Normocephalic. . NG tube intact connected to suction eyes: Conjunctivae and EOM are normal. PERRLA, no scleral icterus.  Neck: Normal ROM. Neck supple. No JVD. No tracheal deviation. CVS: RRR, S1/S2 +, no murmurs, no gallops, no carotid bruit.  Pulmonary: Effort and breath sounds normal, no stridor, rhonchi, wheezes, rales.  Abdominal: Soft.  Hypoactive bowel sounds minimally distended no tenderness, rebound or guarding.  Musculoskeletal: Normal range of motion. No edema and no  tenderness.  Neuro: Alert. CN 2-12 grossly intact. No focal deficits. Skin: Skin is warm and dry. No rash noted. Psychiatric: Normal mood and affect.      LABORATORY PANEL:   CBC Recent Labs  Lab 11/30/18 0402  WBC 8.4  HGB 13.6  HCT 41.1  PLT 252   ------------------------------------------------------------------------------------------------------------------  Chemistries  Recent Labs  Lab 11/28/18 1343  12/01/18 0353  NA 133*   < > 145  K 5.0   < > 3.9  CL 96*   < > 114*  CO2 23   < > 24  GLUCOSE 204*   < > 128*  BUN 78*   < > 65*  CREATININE 2.39*   < > 1.61*  CALCIUM 9.3   < > 8.2*  AST 42*  --   --   ALT 44  --   --   ALKPHOS 73  --   --   BILITOT 1.4*  --   --    < > = values in this interval not displayed.   ------------------------------------------------------------------------------------------------------------------  Cardiac Enzymes No results for input(s): TROPONINI in the last 168 hours. ------------------------------------------------------------------------------------------------------------------  RADIOLOGY:  Ct Abdomen Pelvis W Contrast  Result Date: 12/01/2018 CLINICAL DATA:  Bowel obstruction. EXAM: CT ABDOMEN AND PELVIS WITH CONTRAST TECHNIQUE: Multidetector CT imaging of the abdomen and pelvis was performed using the standard protocol following bolus administration of intravenous contrast. CONTRAST:  52mL OMNIPAQUE IOHEXOL 300 MG/ML  SOLN COMPARISON:  CT scan of November 28, 2018. FINDINGS: Lower chest: Minimal right middle lobe subsegmental atelectasis is noted. Hepatobiliary: No focal liver abnormality is seen. No gallstones, gallbladder wall thickening, or biliary dilatation. Pancreas: Unremarkable. No pancreatic ductal dilatation or surrounding inflammatory changes. Spleen: Normal in size without focal abnormality. Adrenals/Urinary Tract: Adrenal glands appear  normal. Left kidney is not visualized. Nonobstructive right nephrolithiasis is  noted. Right renal cyst is noted. No hydronephrosis or renal obstruction is noted. Urinary bladder is unremarkable. Stomach/Bowel: Nasogastric tube tip is seen in proximal stomach. Stomach is otherwise unremarkable. The appendix appears normal. There remains severe proximal small bowel dilatation consistent with obstruction. Transition zone is seen in the midportion of the abdomen best seen on image number 29 of series 5, the coronal images. There also appears to be a no other loop of dilated small bowel that starts in begins at this same transition zone. Nondilated distal small bowel is also seen arising from this area. It is uncertain if this represents volvulus, but is felt to be unlikely given the lack of significant change over the last 3 days. Potentially, this may be due to surgical adhesion. No colonic dilatation is noted. Vascular/Lymphatic: Aortic atherosclerosis. No enlarged abdominal or pelvic lymph nodes. Reproductive: Status post prostatectomy. Other: Small left inguinal hernia is noted which contains a portion of the sigmoid colon, but does not result in incarceration or obstruction. Musculoskeletal: No acute or significant osseous findings. IMPRESSION: Severe proximal small bowel dilatation is noted with transition zone seen in the central portion of the abdomen. There does appear to be a no other loop of severely dilated more distal small bowel starting and ending at this transition point suggesting closed loop obstruction. Potentially this may represent volvulus, but is felt to be less likely given the lack of ischemic change or any significant change over the last 3 days. Potentially, this small-bowel obstruction may be due to surgical adhesion in this area. Clinical correlation is recommended. Small left inguinal hernia is noted which contains a portion of sigmoid colon, but does not result in incarceration or obstruction Nonobstructive right nephrolithiasis. Aortic Atherosclerosis (ICD10-I70.0).  Electronically Signed   By: Marijo Conception, M.D.   On: 12/01/2018 15:23     ASSESSMENT AND PLAN:   84 year old male with history of chronic kidney disease stage III with solitary kidney and hypertension who presented to the emergency room with abdominal pain.  1.  Abdominal pain with partial small bowel obstruction: Continue management as per surgery Scheduled for exploratory laparotomy today, patient and his son are agreeable Continue NG tube for decompression No plans for immediate surgery   2.  Acute kidney injury on chronic kidney disease stage III with solitary kidney-clinically improving with IV fluids Avoid nephrotoxins Creatinine 2.39 -1.6  3. hypertension: Continue atenolol  4.  Hypothyroidism: Continue Synthroid  5  Hyperlipidemia: Continue statin     D/w surgery this am  Management plans discussed with the patient and son at bedside and they arein agreement.  CODE STATUS: Full  TOTAL TIME TAKING CARE OF THIS PATIENT: 33 minutes.     POSSIBLE D/C for 2-3  days, DEPENDING ON CLINICAL CONDITION.   [name] M.D on 12/02/2018 at 3:40 PM  Between 7am to 6pm - Pager - [phone] After 6pm go to [email or web address] - password [email or web address] [place] [facility]  Office  [phone]  CC: Primary care physician; Idelle Crouch, MD  Note: This dictation was prepared with Dragon dictation along with smaller phrase technology. Any transcriptional errors that result from this process are unintentional.

## 2018-12-03 LAB — BASIC METABOLIC PANEL
Anion gap: 8 (ref 5–15)
BUN: 31 mg/dL — ABNORMAL HIGH (ref 8–23)
CO2: 22 mmol/L (ref 22–32)
Calcium: 8 mg/dL — ABNORMAL LOW (ref 8.9–10.3)
Chloride: 117 mmol/L — ABNORMAL HIGH (ref 98–111)
Creatinine, Ser: 1.14 mg/dL (ref 0.61–1.24)
GFR calc Af Amer: >60 mL/min (ref >60)
GFR calc non Af Amer: 60 mL/min — ABNORMAL LOW (ref >60)
Glucose, Bld: 138 mg/dL — ABNORMAL HIGH (ref 70–99)
Potassium: 4.3 mmol/L (ref 3.5–5.1)
Sodium: 147 mmol/L — ABNORMAL HIGH (ref 135–145)

## 2018-12-03 LAB — CBC
HCT: 39.2 % (ref 39.0–52.0)
Hemoglobin: 12.8 g/dL — ABNORMAL LOW (ref 13.0–17.0)
MCH: 31.4 pg (ref 26.0–34.0)
MCHC: 32.7 g/dL (ref 30.0–36.0)
MCV: 96.1 fL (ref 80.0–100.0)
PLATELETS: 254 10*3/uL (ref 150–400)
RBC: 4.08 MIL/uL — ABNORMAL LOW (ref 4.22–5.81)
RDW: 12.2 % (ref 11.5–15.5)
WBC: 13.3 10*3/uL — ABNORMAL HIGH (ref 4.0–10.5)
nRBC: 0 % (ref 0.0–0.2)

## 2018-12-03 LAB — GLUCOSE, CAPILLARY: Glucose-Capillary: 116 mg/dL — ABNORMAL HIGH (ref 70–99)

## 2018-12-03 NOTE — Progress Notes (Signed)
POD # 1 LOA Doing well No flatus  ambulating AVSS NGT  250cc Has tolerated NG clamping since this am   PE NAD Abd: soft, appropriate incisional tenderness, dressing intact. No peritonitis or infection  A/P Doing well NGT clamping trial today Keep NPO mobilize

## 2018-12-03 NOTE — Progress Notes (Signed)
[place] at [place] NAME: Daniel Lewis    MR#:  [id number]  DATE OF BIRTH:  January 26, 1935  SUBJECTIVE:  Patient's pain is better, had surgery yesterday, ambulating in the hallway after NG was clamped  REVIEW OF SYSTEMS:    Review of Systems  Constitutional: Negative for fever, chills weight loss HENT: Negative for ear pain, nosebleeds, congestion, facial swelling, rhinorrhea, neck pain, neck stiffness and ear discharge.   Respiratory: Negative for cough, shortness of breath, wheezing  Cardiovascular: Negative for chest pain, palpitations and leg swelling.  Gastrointestinal: Negative for heartburn, has some abdominal pain, denies vomiting, diarrhea or consitpation Genitourinary: Negative for dysuria, urgency, frequency, hematuria Musculoskeletal: Negative for back pain or joint pain Neurological: Negative for dizziness, seizures, syncope, focal weakness,  numbness and headaches.  Hematological: Does not bruise/bleed easily.  Psychiatric/Behavioral: Negative for hallucinations, confusion, dysphoric mood    Tolerating Diet: npo      DRUG ALLERGIES:  No Known Allergies  VITALS:  Blood pressure (!) 149/72, pulse 71, temperature 98.5 F (36.9 C), temperature source Oral, resp. rate 18, height 5\' 9"  (1.753 m), weight 59.8 kg, SpO2 95 %.  PHYSICAL EXAMINATION:  Constitutional: Appears well-developed and well-nourished. No distress. HENT: Normocephalic. . NG tube intact connected to suction eyes: Conjunctivae and EOM are normal. PERRLA, no scleral icterus.  Neck: Normal ROM. Neck supple. No JVD. No tracheal deviation. CVS: RRR, S1/S2 +, no murmurs, no gallops, no carotid bruit.  Pulmonary: Effort and breath sounds normal, no stridor, rhonchi, wheezes, rales.  Abdominal: Exploratory laparotomy incision site intact with honeycomb dressing stained with some blood Musculoskeletal: Normal range of motion. No edema and no tenderness.  Neuro:  Alert. CN 2-12 grossly intact. No focal deficits. Skin: Skin is warm and dry. No rash noted. Psychiatric: Normal mood and affect.      LABORATORY PANEL:   CBC Recent Labs  Lab 12/03/18 0343  WBC 13.3*  HGB 12.8*  HCT 39.2  PLT 254   ------------------------------------------------------------------------------------------------------------------  Chemistries  Recent Labs  Lab 11/28/18 1343  12/03/18 0343  NA 133*   < > 147*  K 5.0   < > 4.3  CL 96*   < > 117*  CO2 23   < > 22  GLUCOSE 204*   < > 138*  BUN 78*   < > 31*  CREATININE 2.39*   < > 1.14  CALCIUM 9.3   < > 8.0*  AST 42*  --   --   ALT 44  --   --   ALKPHOS 73  --   --   BILITOT 1.4*  --   --    < > = values in this interval not displayed.   ------------------------------------------------------------------------------------------------------------------  Cardiac Enzymes No results for input(s): TROPONINI in the last 168 hours. ------------------------------------------------------------------------------------------------------------------  RADIOLOGY:  No results found.   ASSESSMENT AND PLAN:   84 year old male with history of chronic kidney disease stage III with solitary kidney and hypertension who presented to the emergency room with abdominal pain.  1.  Abdominal pain with partial small bowel obstruction: Status post exploratory laparotomy postop day 1 Continue management as per surgery NG clamped Ambulating in the hallway  2.  Acute kidney injury on chronic kidney disease stage III with solitary kidney-clinically improving with IV fluids Avoid nephrotoxins Creatinine 2.39 -1.6-1.14  3. hypertension: Continue atenolol  4.  Hypothyroidism: Continue Synthroid  5  Hyperlipidemia: Continue statin     D/w surgery this am  Management plans discussed with the patient and son at bedside and they arein agreement.  CODE STATUS: Full  TOTAL TIME TAKING CARE OF THIS PATIENT: 33 minutes.      POSSIBLE D/C for 2-3  days, DEPENDING ON CLINICAL CONDITION.   [name] M.D on 12/03/2018 at 3:41 PM  Between 7am to 6pm - Pager - [phone] After 6pm go to [email or web address] - password [email or web address] [place] [facility]  Office  [phone]  CC: Primary care physician; Daniel Crouch, MD  Note: This dictation was prepared with Dragon dictation along with smaller phrase technology. Any transcriptional errors that result from this process are unintentional.

## 2018-12-04 LAB — BASIC METABOLIC PANEL
Anion gap: 7 (ref 5–15)
BUN: 25 mg/dL — ABNORMAL HIGH (ref 8–23)
CALCIUM: 8.3 mg/dL — AB (ref 8.9–10.3)
CO2: 22 mmol/L (ref 22–32)
CREATININE: 1.06 mg/dL (ref 0.61–1.24)
Chloride: 119 mmol/L — ABNORMAL HIGH (ref 98–111)
GFR calc Af Amer: >60 mL/min (ref >60)
GFR calc non Af Amer: >60 mL/min (ref >60)
Glucose, Bld: 120 mg/dL — ABNORMAL HIGH (ref 70–99)
Potassium: 3.9 mmol/L (ref 3.5–5.1)
Sodium: 148 mmol/L — ABNORMAL HIGH (ref 135–145)

## 2018-12-04 LAB — GLUCOSE, CAPILLARY: Glucose-Capillary: 114 mg/dL — ABNORMAL HIGH (ref 70–99)

## 2018-12-04 MED ORDER — AMLODIPINE BESYLATE 5 MG PO TABS
5.0000 mg | ORAL_TABLET | Freq: Once | ORAL | Status: AC
  Administered 2018-12-04: 5 mg via ORAL
  Filled 2018-12-04: qty 1

## 2018-12-04 MED ORDER — AMLODIPINE BESYLATE 5 MG PO TABS: 5.0000 mg | ORAL_TABLET | Freq: Every day | ORAL | Status: DC

## 2018-12-04 NOTE — Progress Notes (Signed)
[place] at [place] NAME: Stone Spirito    MR#:  [id number]  DATE OF BIRTH:  Nov 04, 1935  SUBJECTIVE:  Patient's pain is better, had surgery on 12/02/2018, ambulating in the hallway  NG removed but no flatus yet  REVIEW OF SYSTEMS:    Review of Systems  Constitutional: Negative for fever, chills weight loss HENT: Negative for ear pain, nosebleeds, congestion, facial swelling, rhinorrhea, neck pain, neck stiffness and ear discharge.   Respiratory: Negative for cough, shortness of breath, wheezing  Cardiovascular: Negative for chest pain, palpitations and leg swelling.  Gastrointestinal: Negative for heartburn, has some abdominal discomfort, denies vomiting, diarrhea or consitpation Genitourinary: Negative for dysuria, urgency, frequency, hematuria Musculoskeletal: Negative for back pain or joint pain Neurological: Negative for dizziness, seizures, syncope, focal weakness,  numbness and headaches.  Hematological: Does not bruise/bleed easily.  Psychiatric/Behavioral: Negative for hallucinations, confusion, dysphoric mood    Tolerating Diet: npo      DRUG ALLERGIES:  No Known Allergies  VITALS:  Blood pressure (!) 151/60, pulse 62, temperature 98.6 F (37 C), temperature source Oral, resp. rate 14, height 5\' 9"  (1.753 m), weight 59.8 kg, SpO2 97 %.  PHYSICAL EXAMINATION:  Constitutional: Appears well-developed and well-nourished. No distress. HENT: Normocephalic. . NG tube intact connected to suction eyes: Conjunctivae and EOM are normal. PERRLA, no scleral icterus.  Neck: Normal ROM. Neck supple. No JVD. No tracheal deviation. CVS: RRR, S1/S2 +, no murmurs, no gallops, no carotid bruit.  Pulmonary: Effort and breath sounds normal, no stridor, rhonchi, wheezes, rales.  Abdominal: Exploratory laparotomy incision site intact with honeycomb dressing stained with some blood.  Hypoactive minimal bowel sounds Musculoskeletal: Normal  range of motion. No edema and no tenderness.  Neuro: Alert. CN 2-12 grossly intact. No focal deficits. Skin: Skin is warm and dry. No rash noted. Psychiatric: Normal mood and affect.      LABORATORY PANEL:   CBC Recent Labs  Lab 12/03/18 0343  WBC 13.3*  HGB 12.8*  HCT 39.2  PLT 254   ------------------------------------------------------------------------------------------------------------------  Chemistries  Recent Labs  Lab 11/28/18 1343  12/04/18 0449  NA 133*   < > 148*  K 5.0   < > 3.9  CL 96*   < > 119*  CO2 23   < > 22  GLUCOSE 204*   < > 120*  BUN 78*   < > 25*  CREATININE 2.39*   < > 1.06  CALCIUM 9.3   < > 8.3*  AST 42*  --   --   ALT 44  --   --   ALKPHOS 73  --   --   BILITOT 1.4*  --   --    < > = values in this interval not displayed.   ------------------------------------------------------------------------------------------------------------------  Cardiac Enzymes No results for input(s): TROPONINI in the last 168 hours. ------------------------------------------------------------------------------------------------------------------  RADIOLOGY:  No results found.   ASSESSMENT AND PLAN:   84 year old male with history of chronic kidney disease stage III with solitary kidney and hypertension who presented to the emergency room with abdominal pain.  1.  Abdominal pain with partial small bowel obstruction: Status post exploratory laparotomy postop day 1 Continue management  per surgery NG removed Ice chips as needed Ambulating in the hallway  2.  Acute kidney injury on chronic kidney disease stage III with solitary kidney-clinically improving with IV fluids Avoid nephrotoxins Creatinine 2.39 -1.6-1.14-1.06  3. hypertension: Continue atenolol  4.  Hypothyroidism: Continue Synthroid  5  Hyperlipidemia: Continue statin     D/w surgery this am  Management plans discussed with the patient and son at bedside and they arein  agreement.  CODE STATUS: Full  TOTAL TIME TAKING CARE OF THIS PATIENT: 33 minutes.     POSSIBLE D/C for 2-3  days, DEPENDING ON CLINICAL CONDITION.   [name] M.D on 12/04/2018 at 5:34 PM  Between 7am to 6pm - Pager - [phone] After 6pm go to [email or web address] - password [email or web address] [place] [facility]  Office  [phone]  CC: Primary care physician; Idelle Crouch, MD  Note: This dictation was prepared with Dragon dictation along with smaller phrase technology. Any transcriptional errors that result from this process are unintentional.

## 2018-12-04 NOTE — Progress Notes (Signed)
POD # 2 LOA Doing well No flatus  ambulating AVSS olerated NG clamping since  Yesterday, I checked residual and there was only 10 cc  PE NAD Abd: soft, appropriate incisional tenderness, dressing intact. No peritonitis or infection  A/P Doing well D/C NGT Keep NPO except ice chips mobilize

## 2018-12-05 ENCOUNTER — Inpatient Hospital Stay: Payer: Self-pay

## 2018-12-05 ENCOUNTER — Encounter: Payer: Self-pay | Admitting: Surgery

## 2018-12-05 DIAGNOSIS — E43 Unspecified severe protein-calorie malnutrition: Secondary | ICD-10-CM

## 2018-12-05 LAB — BASIC METABOLIC PANEL
Anion gap: 5 (ref 5–15)
BUN: 26 mg/dL — ABNORMAL HIGH (ref 8–23)
CO2: 23 mmol/L (ref 22–32)
Calcium: 8.2 mg/dL — ABNORMAL LOW (ref 8.9–10.3)
Chloride: 120 mmol/L — ABNORMAL HIGH (ref 98–111)
Creatinine, Ser: 0.98 mg/dL (ref 0.61–1.24)
GFR calc Af Amer: >60 mL/min (ref >60)
GLUCOSE: 100 mg/dL — AB (ref 70–99)
Potassium: 3.8 mmol/L (ref 3.5–5.1)
Sodium: 148 mmol/L — ABNORMAL HIGH (ref 135–145)

## 2018-12-05 LAB — PHOSPHORUS: Phosphorus: 2.1 mg/dL — ABNORMAL LOW (ref 2.5–4.6)

## 2018-12-05 LAB — CBC
HCT: 33.5 % — ABNORMAL LOW (ref 39.0–52.0)
Hemoglobin: 10.9 g/dL — ABNORMAL LOW (ref 13.0–17.0)
MCH: 31.9 pg (ref 26.0–34.0)
MCHC: 32.5 g/dL (ref 30.0–36.0)
MCV: 98 fL (ref 80.0–100.0)
Platelets: 244 10*3/uL (ref 150–400)
RBC: 3.42 MIL/uL — ABNORMAL LOW (ref 4.22–5.81)
RDW: 12.4 % (ref 11.5–15.5)
WBC: 10.2 10*3/uL (ref 4.0–10.5)
nRBC: 0 % (ref 0.0–0.2)

## 2018-12-05 LAB — CREATININE, SERUM
Creatinine, Ser: 0.94 mg/dL (ref 0.61–1.24)
GFR calc Af Amer: >60 mL/min (ref >60)
GFR calc non Af Amer: >60 mL/min (ref >60)

## 2018-12-05 LAB — MAGNESIUM: Magnesium: 2 mg/dL (ref 1.7–2.4)

## 2018-12-05 LAB — GLUCOSE, CAPILLARY: Glucose-Capillary: 98 mg/dL (ref 70–99)

## 2018-12-05 MED ORDER — K PHOS MONO-SOD PHOS DI & MONO 155-852-130 MG PO TABS
500.0000 mg | ORAL_TABLET | ORAL | Status: AC
  Administered 2018-12-05 (×2): 500 mg via ORAL
  Filled 2018-12-05 (×2): qty 2

## 2018-12-05 MED ORDER — TRACE MINERALS CR-CU-MN-SE-ZN 10-1000-500-60 MCG/ML IV SOLN
INTRAVENOUS | Status: AC
  Administered 2018-12-05: 19:00:00 via INTRAVENOUS
  Filled 2018-12-05: qty 960

## 2018-12-05 MED ORDER — SODIUM CHLORIDE 0.9% FLUSH
10.0000 mL | Freq: Two times a day (BID) | INTRAVENOUS | Status: DC
  Administered 2018-12-05 – 2018-12-08 (×6): 10 mL

## 2018-12-05 MED ORDER — FAT EMULSION PLANT BASED 20 % IV EMUL
250.0000 mL | INTRAVENOUS | Status: AC
  Administered 2018-12-05: 250 mL via INTRAVENOUS
  Filled 2018-12-05: qty 250

## 2018-12-05 MED ORDER — INSULIN ASPART 100 UNIT/ML ~~LOC~~ SOLN
0.0000 [IU] | Freq: Four times a day (QID) | SUBCUTANEOUS | Status: DC
  Administered 2018-12-06 (×4): 2 [IU] via SUBCUTANEOUS
  Administered 2018-12-07: 1 [IU] via SUBCUTANEOUS
  Filled 2018-12-05 (×5): qty 1

## 2018-12-05 MED ORDER — SODIUM CHLORIDE 0.9% FLUSH: 10.0000 mL | INTRAVENOUS | Status: DC | PRN

## 2018-12-05 MED ORDER — SODIUM CHLORIDE 0.9 % IV SOLN
INTRAVENOUS | Status: DC
  Administered 2018-12-05 – 2018-12-07 (×3): via INTRAVENOUS

## 2018-12-05 MED ORDER — FLEET ENEMA 7-19 GM/118ML RE ENEM
1.0000 | ENEMA | Freq: Once | RECTAL | Status: AC
  Administered 2018-12-05: 1 via RECTAL

## 2018-12-05 NOTE — Consult Note (Signed)
PHARMACY - ADULT TOTAL PARENTERAL NUTRITION CONSULT NOTE   Pharmacy Consult for TPN Indication: severe malnutrition  Patient Measurements: Height: 5\' 9"  (175.3 cm) Weight: 134 lb 4.2 oz (60.9 kg) IBW/kg (Calculated) : 70.7 TPN AdjBW (KG): 61 Body mass index is 19.83 kg/m.  Assessment: 84 y.o. male with post-operative ileus, s/p exploratory laparotomy and lysis of adhesion for small bowel obstruction 2/02, complicated by pertinent comorbidities including HTN, HLD, CAD s/p remote PCI without stent for MI, CKD III with congenital solitary kidney, hypothyroidism and vertigo.  Patient has a 13lb(9%) wt loss over the past year; this is not significant. Pt reports nausea/vomiting and unable to keep anything down since 1/3. Pt has been NPO since admit. Pt had NGT placed which was removed 1/12. Plan is to start TPN today; PICC line ordered. Pt likely at high refeed risk; plan to replace phosphorus today prior to TPN initiation.  GI: LBM 1/9 Endo: SSI Insulin requirements in the past 24 hours: none Lytes: phosphorous 2.1 replacing with KPhos 4 tabs total, other electrolytes wnl Renal: Scr 2.39------><1(baseline 1.1-1.2) Pulm: Cards:  Hepatobil: Neuro: ID:WBC 11.7>10.1>wnl Zosyn 1/6>1/10  TPN Access: 12/05/17 TPN start date: 12/05/17 Nutritional Goals (per RD recommendation on 01/13): KCal: 1853 kcal/day Protein: 78 grams/day Fluid: 1800 ml  Goal TPN rate is 65 ml/hr (provides 90% of their needs )  Current Nutrition: none  Plan:  Start E 5/20 TPN at 28mL/hr. Start 20% lipid emulsion at 20 ml/hr This TPN provides 48 g of protein, 192 g of dextrose, and 48 g of lipids which provides 1140 kCals per day, meeting 62% of patient needs Electrolytes in TPN: no additional Add MVI, trace elements and IV thiamine 100mg  daily for 3 days  to TPN Sensitive Q6h SSI and adjust as needed NS IVMF  at 35 ml/hr Monitor TPN labs F/U 1/14  Dallie Piles, PharmD 12/05/2018,11:35 AM

## 2018-12-05 NOTE — Progress Notes (Signed)
[place] at [place] NAME: Daniel Lewis    MR#:  [id number]  DATE OF BIRTH:  01-07-1935  SUBJECTIVE:  Patient's pain is better, ambulating in the hallway  NG removed but no flatus yet, surgery starting the patient on TPN  REVIEW OF SYSTEMS:    Review of Systems  Constitutional: Negative for fever, chills weight loss HENT: Negative for ear pain, nosebleeds, congestion, facial swelling, rhinorrhea, neck pain, neck stiffness and ear discharge.   Respiratory: Negative for cough, shortness of breath, wheezing  Cardiovascular: Negative for chest pain, palpitations and leg swelling.  Gastrointestinal: Negative for heartburn, has some abdominal discomfort, denies vomiting, diarrhea or consitpation Genitourinary: Negative for dysuria, urgency, frequency, hematuria Musculoskeletal: Negative for back pain or joint pain Neurological: Negative for dizziness, seizures, syncope, focal weakness,  numbness and headaches.  Hematological: Does not bruise/bleed easily.  Psychiatric/Behavioral: Negative for hallucinations, confusion, dysphoric mood    Tolerating Diet: npo      DRUG ALLERGIES:  No Known Allergies  VITALS:  Blood pressure 132/60, pulse (!) 56, temperature 98 F (36.7 C), temperature source Oral, resp. rate 18, height 5\' 9"  (1.753 m), weight 60.9 kg, SpO2 100 %.  PHYSICAL EXAMINATION:  Constitutional: Appears well-developed and well-nourished. No distress. HENT: Normocephalic. . NG tube intact connected to suction eyes: Conjunctivae and EOM are normal. PERRLA, no scleral icterus.  Neck: Normal ROM. Neck supple. No JVD. No tracheal deviation. CVS: RRR, S1/S2 +, no murmurs, no gallops, no carotid bruit.  Pulmonary: Effort and breath sounds normal, no stridor, rhonchi, wheezes, rales.  Abdominal: Exploratory laparotomy incision site intact with honeycomb dressing stained with some blood.  Hypoactive minimal bowel  sounds Musculoskeletal: Normal range of motion. No edema and no tenderness.  Neuro: Alert. CN 2-12 grossly intact. No focal deficits. Skin: Skin is warm and dry. No rash noted. Psychiatric: Normal mood and affect.      LABORATORY PANEL:   CBC Recent Labs  Lab 12/05/18 0359  WBC 10.2  HGB 10.9*  HCT 33.5*  PLT 244   ------------------------------------------------------------------------------------------------------------------  Chemistries  Recent Labs  Lab 11/28/18 1343  12/05/18 0359  NA 133*   < > 148*  K 5.0   < > 3.8  CL 96*   < > 120*  CO2 23   < > 23  GLUCOSE 204*   < > 100*  BUN 78*   < > 26*  CREATININE 2.39*   < > 0.98  0.94  CALCIUM 9.3   < > 8.2*  MG  --   --  2.0  AST 42*  --   --   ALT 44  --   --   ALKPHOS 73  --   --   BILITOT 1.4*  --   --    < > = values in this interval not displayed.   ------------------------------------------------------------------------------------------------------------------  Cardiac Enzymes No results for input(s): TROPONINI in the last 168 hours. ------------------------------------------------------------------------------------------------------------------  RADIOLOGY:  [place] Ekg Site Rite  Result Date: 12/05/2018 If Site Rite image not attached, placement could not be confirmed due to current cardiac rhythm.    ASSESSMENT AND PLAN:   84 year old male with history of chronic kidney disease stage III with solitary kidney and hypertension who presented to the emergency room with abdominal pain.  1.  Abdominal pain with partial small bowel obstruction: Status post exploratory laparotomy Continue management  per surgery NG removed Ice chips as needed but no flatus.  PICC line placement and  to start TPN Ambulating in the hallway  2.  Acute kidney injury on chronic kidney disease stage III with solitary kidney- Resolved with IV fluids Avoid nephrotoxins  3. hypertension: Continue atenolol  4.   Hypothyroidism: Continue Synthroid  5  Hyperlipidemia: Continue statin  6.  Severe protein calorie malnutrition-seen by dietitian Patient is getting started on TPN as patient is n.p.o. for 7 days Monitor electrolytes   D/w surgery this am  Management plans discussed with the patient and son at bedside and they arein agreement.  CODE STATUS: Full  TOTAL TIME TAKING CARE OF THIS PATIENT: 33 minutes.     POSSIBLE D/C for 2-3  days, DEPENDING ON CLINICAL CONDITION.   [name] M.D on 12/05/2018 at 11:46 AM  Between 7am to 6pm - Pager - [phone] After 6pm go to [email or web address] - password [email or web address] [facility]  Office  [phone]  CC: Primary care physician; Idelle Crouch, MD  Note: This dictation was prepared with Dragon dictation along with smaller phrase technology. Any transcriptional errors that result from this process are unintentional.

## 2018-12-05 NOTE — Progress Notes (Signed)
[facility] Day(s): 7.   Post op day(s): 3 Days Post-Op.   Interval History: Patient seen and examined, no acute events or new complaints overnight. Patient reports he still feels some abdominal distension but his pain improved. He denied any nausea or emesis. He continues to deny passing flatus. No additional complaints this morning  Review of Systems:  Constitutional: denies fever, chills  Gastrointestinal: denies abdominal pain, N/V, or diarrhea/and bowel function as per interval history Integumentary: denies any other rashes or skin discolorations except surgical incision  Vital signs in last 24 hours: [min-max] current  Temp:  [98.2 F (36.8 C)-98.6 F (37 C)] 98.2 F (36.8 C) (01/13 0549) Pulse Rate:  [49-62] 49 (01/13 0549) Resp:  [14-18] 18 (01/13 0549) BP: (125-151)/(55-66) 125/55 (01/13 0549) SpO2:  [95 %-97 %] 95 % (01/13 0549) Weight:  [60.9 kg] 60.9 kg (01/13 0500)     Height: 5\' 9"  (175.3 cm) Weight: 60.9 kg BMI (Calculated): 19.82   Intake/Output this shift:  Total I/O In: -  Out: 200 [Urine:200]   Intake/Output last 2 shifts:  @IOLAST2SHIFTS @   Physical Exam:  Constitutional: alert, cooperative and no distress  Respiratory: breathing non-labored at rest  Gastrointestinal: soft, non-tender, mild distension Integumentary: midline laparotomy incision is CDI, no erythema or drainage  Labs:  CBC Latest Ref Rng & Units 12/05/2018 12/03/2018 11/30/2018  WBC 4.0 - 10.5 K/uL 10.2 13.3(H) 8.4  Hemoglobin 13.0 - 17.0 g/dL 10.9(L) 12.8(L) 13.6  Hematocrit 39.0 - 52.0 % 33.5(L) 39.2 41.1  Platelets 150 - 400 K/uL 244 254 252   CMP Latest Ref Rng & Units 12/05/2018 12/04/2018 12/03/2018  Glucose 70 - 99 mg/dL - 120(H) 138(H)  BUN 8 - 23 mg/dL - 25(H) 31(H)  Creatinine 0.61 - 1.24 mg/dL 0.94 1.06 1.14  Sodium 135 - 145 mmol/L - 148(H) 147(H)  Potassium 3.5 - 5.1 mmol/L - 3.9 4.3  Chloride 98 - 111 mmol/L - 119(H) 117(H)   CO2 22 - 32 mmol/L - 22 22  Calcium 8.9 - 10.3 mg/dL - 8.3(L) 8.0(L)  Total Protein 6.5 - 8.1 g/dL - - -  Total Bilirubin 0.3 - 1.2 mg/dL - - -  Alkaline Phos 38 - 126 U/L - - -  AST 15 - 41 U/L - - -  ALT 0 - 44 U/L - - -     Assessment/Plan: (ICD-10's: K57.21) 84 y.o. male with possible prolonged post-operative ileus who is 3 Days Post-Op s/p exploratory laparotomy and lysis of adhesion for small bowel obstruction, complicated by pertinent comorbidities including advanced chronological age, HTN, HLD, CAD s/p remote PCI without stent for MI, CKD with solitary kidney, hypothyroidism, and vertigo.   - Continue NPO + IVF  - Monitor on-going bowel function and abdominal examination  - Pain control as needed (minimize narcotics)  - Will need to start TPN today given lack of flatus and being NPO for 7 days, will consult dietitian, consult for PICC  - Monitor electrolytes  - Mobilization encouraged  - DVT prophylaxis   All of the above findings and recommendations were discussed with the patient, patient's family, and the medical team, and all of patient's and family's questions were answered to their expressed satisfaction.  -- Edison Simon, PA-C [facility] 12/05/2018, 8:08 AM [phone] M-F: 7am - 4pm

## 2018-12-05 NOTE — Progress Notes (Signed)
Order received from Dr Dahlia Byes for fleets enema

## 2018-12-05 NOTE — Progress Notes (Signed)
Initial Nutrition Assessment  DOCUMENTATION CODES:   Severe malnutrition in context of chronic illness  INTERVENTION:    Initiate Clinimix 5/20 with electrolytes at 46m/hr + 20% lipids '@20ml' /hr x 12 hrs/day (Goal rate 623mhr once labs stable)   Regimen @ goal rate provides 1853kcal/day, 78g/day protein, 180038molume    Add MVI daily   Add trace elements daily    Add IV thiamine 100m25mily x 3 days   Pt at high refeeding risk; recommend check P, K, and Mg daily for 3 days after TPN iniatition.    Daily weights   Keep total IVF + TPN rate at 75ml87mper MD  NUTRITION DIAGNOSIS:   Severe Malnutrition related to chronic illness(Prostate cancer, CKD III, advanced age ) as evidenced by moderate to severe fat depletions, moderate to severe muscle depletions.  GOAL:   Patient will meet greater than or equal to 90% of their needs  MONITOR:   Labs, Diet advancement, Weight trends, Skin, I & O's, Other (Comment)(TPN)  REASON FOR ASSESSMENT:   Consult New TPN/TNA  ASSESSMENT:   83 y.70 male with post-operative ileus, s/p exploratory laparotomy and lysis of adhesion for small bowel obstruction 1/10,3/60plicated by pertinent comorbidities including HTN, HLD, CAD s/p remote PCI without stent for MI, CKD III with congenital solitary kidney, hypothyroidism and vertigo.  Met with pt in room today. Pt reports fair appetite and oral intake at baseline; reports "I don't eat a lot, but I eat when I want to". Pt also states "I try to keep my weight down." Per chart, pt with 13lb(9%) wt loss over the past year; this is not significant. Pt reports nausea/vomiting and unable to keep anything down since 1/3. Pt has been NPO since admit. Pt had NGT placed which was removed 1/12. Pt reports he is hungry today. Reports abdominal pain around his incision site and mild to moderate distension. Pt denies any nausea or vomiting today; states "I am feeling much better". Pt documented to have had a small  type 1 BM yesterday. Pt denies any flatus today. Pt sitting up in chair at time of RD visit. Pt to have fleet enema. Plan is to start TPN today; PICC line ordered. Pt likely at high refeed risk; plan to replace phosphorus today prior to TPN initiation. Pt with CKD III and one kidney; monitor electrolytes, BUN/creat and weights daily.        Medications reviewed and include: aspirin, lovenox, synthroid, protonix, NaCl '@75ml' /hr  Labs reviewed: Na 148(H), BUN 26(H) P 2.1(L), Mg 2.0 wnl Hgb 10.9(L), Hct 33.5(L)  NUTRITION - FOCUSED PHYSICAL EXAM:    Most Recent Value  Orbital Region  Moderate depletion  Upper Arm Region  Severe depletion  Thoracic and Lumbar Region  Moderate depletion  Buccal Region  Moderate depletion  Temple Region  Moderate depletion  Clavicle Bone Region  Severe depletion  Clavicle and Acromion Bone Region  Severe depletion  Scapular Bone Region  Moderate depletion  Dorsal Hand  Moderate depletion  Patellar Region  Severe depletion  Anterior Thigh Region  Severe depletion  Posterior Calf Region  Severe depletion  Edema (RD Assessment)  None  Hair  Reviewed  Eyes  Reviewed  Mouth  Reviewed  Skin  Reviewed  Nails  Reviewed     Diet Order:   Diet Order            Diet NPO time specified  Diet effective now  EDUCATION NEEDS:   Education needs have been addressed  Skin:  Skin Assessment: Reviewed RN Assessment(incision abdomen )  Last BM:  1/12- type 1  Height:   Ht Readings from Last 1 Encounters:  11/28/18 '5\' 9"'  (1.753 m)    Weight:   Wt Readings from Last 1 Encounters:  12/05/18 60.9 kg    Ideal Body Weight:  72.7 kg  BMI:  Body mass index is 19.83 kg/m.  Estimated Nutritional Needs:   Kcal:  1700-2000kcal/day   Protein:  80-91g/day   Fluid:  1.5L/day   Koleen Distance MS, RD, LDN Pager #- [phone] Office#- [phone] After Hours Pager: [phone]

## 2018-12-05 NOTE — Progress Notes (Signed)
Peripherally Inserted Central Catheter/Midline Placement  The IV Nurse has discussed with the patient and/or persons authorized to consent for the patient, the purpose of this procedure and the potential benefits and risks involved with this procedure.  The benefits include less needle sticks, lab draws from the catheter, and the patient may be discharged home with the catheter. Risks include, but not limited to, infection, bleeding, blood clot (thrombus formation), and puncture of an artery; nerve damage and irregular heartbeat and possibility to perform a PICC exchange if needed/ordered by physician.  Alternatives to this procedure were also discussed.  Bard Power PICC patient education guide, fact sheet on infection prevention and patient information card has been provided to patient /or left at bedside.    PICC/Midline Placement Documentation        Daniel Lewis 12/05/2018, 3:05 PM

## 2018-12-05 NOTE — Care Management Important Message (Signed)
Copy of signed Medicare IM left with patient in room. 

## 2018-12-06 LAB — BASIC METABOLIC PANEL
Anion gap: 5 (ref 5–15)
BUN: 29 mg/dL — AB (ref 8–23)
CO2: 25 mmol/L (ref 22–32)
Calcium: 7.8 mg/dL — ABNORMAL LOW (ref 8.9–10.3)
Chloride: 117 mmol/L — ABNORMAL HIGH (ref 98–111)
Creatinine, Ser: 1 mg/dL (ref 0.61–1.24)
GFR calc Af Amer: >60 mL/min (ref >60)
GFR calc non Af Amer: >60 mL/min (ref >60)
GLUCOSE: 168 mg/dL — AB (ref 70–99)
Potassium: 3.3 mmol/L — ABNORMAL LOW (ref 3.5–5.1)
Sodium: 147 mmol/L — ABNORMAL HIGH (ref 135–145)

## 2018-12-06 LAB — GLUCOSE, CAPILLARY
Glucose-Capillary: 114 mg/dL — ABNORMAL HIGH (ref 70–99)
Glucose-Capillary: 153 mg/dL — ABNORMAL HIGH (ref 70–99)
Glucose-Capillary: 160 mg/dL — ABNORMAL HIGH (ref 70–99)
Glucose-Capillary: 161 mg/dL — ABNORMAL HIGH (ref 70–99)
Glucose-Capillary: 166 mg/dL — ABNORMAL HIGH (ref 70–99)

## 2018-12-06 LAB — PREALBUMIN: Prealbumin: 7.5 mg/dL — ABNORMAL LOW (ref 18–38)

## 2018-12-06 LAB — TRIGLYCERIDES: Triglycerides: 124 mg/dL (ref <150)

## 2018-12-06 LAB — PHOSPHORUS: Phosphorus: 3.4 mg/dL (ref 2.5–4.6)

## 2018-12-06 LAB — MAGNESIUM: Magnesium: 1.9 mg/dL (ref 1.7–2.4)

## 2018-12-06 MED ORDER — POTASSIUM CHLORIDE CRYS ER 20 MEQ PO TBCR
40.0000 meq | EXTENDED_RELEASE_TABLET | Freq: Once | ORAL | Status: AC
  Administered 2018-12-06: 40 meq via ORAL
  Filled 2018-12-06 (×2): qty 2

## 2018-12-06 MED ORDER — TRACE MINERALS CR-CU-MN-SE-ZN 10-1000-500-60 MCG/ML IV SOLN
INTRAVENOUS | Status: AC
  Administered 2018-12-06: 18:00:00 via INTRAVENOUS
  Filled 2018-12-06: qty 960

## 2018-12-06 MED ORDER — BOOST / RESOURCE BREEZE PO LIQD CUSTOM
1.0000 | Freq: Three times a day (TID) | ORAL | Status: DC
  Administered 2018-12-06: 1 via ORAL

## 2018-12-06 MED ORDER — FAT EMULSION PLANT BASED 20 % IV EMUL
250.0000 mL | INTRAVENOUS | Status: AC
  Administered 2018-12-06: 250 mL via INTRAVENOUS
  Filled 2018-12-06: qty 250

## 2018-12-06 NOTE — Consult Note (Signed)
PHARMACY - ADULT TOTAL PARENTERAL NUTRITION CONSULT NOTE   Pharmacy Consult for TPN Indication: severe malnutrition  Patient Measurements: Height: 5\' 9"  (175.3 cm) Weight: 135 lb 2.3 oz (61.3 kg) IBW/kg (Calculated) : 70.7 TPN AdjBW (KG): 61 Body mass index is 19.96 kg/m.  Assessment: 84 y.o. male with post-operative ileus, s/p exploratory laparotomy and lysis of adhesion for small bowel obstruction 5/36, complicated by pertinent comorbidities including HTN, HLD, CAD s/p remote PCI without stent for MI, CKD III with congenital solitary kidney, hypothyroidism and vertigo.  Patient has a 13lb(9%) wt loss over the past year; this is not significant. Pt reports nausea/vomiting and unable to keep anything down since 1/3. Pt has been NPO since admit. Pt had NGT placed which was removed 1/12. Plan is to start TPN today; PICC line ordered. Pt likely at high refeed risk; plan to replace phosphorus today prior to TPN initiation.  GI: LBM 1/9 Endo: SSI Insulin requirements in the past 24 hours: 4 units Lytes:K 3.3 replaced with oral KCl 40 mEq once, other electrolytes wnl Renal: Scr 2.39------><1(baseline 1.1-1.2) Pulm: Cards:  Hepatobil: Neuro: ID:WBC 11.7>10.1>wnl Zosyn 1/6>1/10  TPN Access: 12/05/17 TPN start date: 12/05/17 Nutritional Goals (per RD recommendation on 01/13): KCal: 1853 kcal/day Protein: 78 grams/day Fluid: 1800 ml  Goal TPN rate is 65 ml/hr (provides 90% of their needs )  Current Nutrition: none  Plan:  Continue E 5/20 TPN at 73mL/hr. It is possible that the patient can come off of the TPN tomorrow due to his improving clinical picture, therefore rate is not being advanced. Start 20% lipid emulsion at 20 ml/hr This TPN provides 48 g of protein, 192 g of dextrose, and 48 g of lipids which provides 1140 kCals per day, meeting 62% of patient needs Electrolytes in TPN: no additional Add MVI, trace elements and IV thiamine 100mg  daily for 3 days (1 more day after  today)  to TPN Sensitive Q6h SSI and adjust as needed NS IVMF  at 35 ml/hr Monitor TPN labs F/U 1/15  Dallie Piles, PharmD 12/06/2018,10:05 AM

## 2018-12-06 NOTE — Progress Notes (Signed)
[place] at [place] NAME: Daniel Lewis    MR#:  [id number]  DATE OF BIRTH:  [birth year]  SUBJECTIVE:  Patient's pain is better, ambulating in the hallway  Reports flatus and small bowel movement, started on clears and tapering off TPN  REVIEW OF SYSTEMS:    Review of Systems  Constitutional: Negative for fever, chills weight loss HENT: Negative for ear pain, nosebleeds, congestion, facial swelling, rhinorrhea, neck pain, neck stiffness and ear discharge.   Respiratory: Negative for cough, shortness of breath, wheezing  Cardiovascular: Negative for chest pain, palpitations and leg swelling.  Gastrointestinal: Negative for heartburn, has some abdominal discomfort, denies vomiting, diarrhea or consitpation Genitourinary: Negative for dysuria, urgency, frequency, hematuria Musculoskeletal: Negative for back pain or joint pain Neurological: Negative for dizziness, seizures, syncope, focal weakness,  numbness and headaches.  Hematological: Does not bruise/bleed easily.  Psychiatric/Behavioral: Negative for hallucinations, confusion, dysphoric mood    Tolerating Diet: npo      DRUG ALLERGIES:  No Known Allergies  VITALS:  Blood pressure (!) 146/74, pulse 61, temperature 97.7 F (36.5 C), temperature source Oral, resp. rate 18, height 5\' 9"  (1.753 m), weight 61.3 kg, SpO2 100 %.  PHYSICAL EXAMINATION:  Constitutional: Appears well-developed and well-nourished. No distress. HENT: Normocephalic. . NG tube intact connected to suction eyes: Conjunctivae and EOM are normal. PERRLA, no scleral icterus.  Neck: Normal ROM. Neck supple. No JVD. No tracheal deviation. CVS: RRR, S1/S2 +, no murmurs, no gallops, no carotid bruit.  Pulmonary: Effort and breath sounds normal, no stridor, rhonchi, wheezes, rales.  Abdominal: Exploratory laparotomy incision site intact with honeycomb dressing stained with some blood.  Positive bowel  sounds Musculoskeletal: Normal range of motion. No edema and no tenderness.  Neuro: Alert. CN 2-12 grossly intact. No focal deficits. Skin: Skin is warm and dry. No rash noted. Psychiatric: Normal mood and affect.      LABORATORY PANEL:   CBC Recent Labs  Lab 12/05/18 0359  WBC 10.2  HGB 10.9*  HCT 33.5*  PLT 244   ------------------------------------------------------------------------------------------------------------------  Chemistries  Recent Labs  Lab 12/06/18 0538  NA 147*  K 3.3*  CL 117*  CO2 25  GLUCOSE 168*  BUN 29*  CREATININE 1.00  CALCIUM 7.8*  MG 1.9   ------------------------------------------------------------------------------------------------------------------  Cardiac Enzymes No results for input(s): TROPONINI in the last 168 hours. ------------------------------------------------------------------------------------------------------------------  RADIOLOGY:  [place] Ekg Site Rite  Result Date: 12/05/2018 If Site Rite image not attached, placement could not be confirmed due to current cardiac rhythm.    ASSESSMENT AND PLAN:   84 year old male with history of chronic kidney disease stage III with solitary kidney and hypertension who presented to the emergency room with abdominal pain.  1.  Abdominal pain with partial small bowel obstruction: Status post exploratory laparotomy Continue management  per surgery NG removed Clear liquid started as patient has flatus and small bowel movement Wean down TPN Ambulating in the hallway  2.  Acute kidney injury on chronic kidney disease stage III with solitary kidney- Resolved with IV fluids Avoid nephrotoxins  3. hypertension: Continue atenolol  4.  Hypothyroidism: Continue Synthroid  5  Hyperlipidemia: Continue statin  6.  Severe protein calorie malnutrition-seen by dietitian Patient is on TPN  Monitor electrolytes   D/w surgery this am  Management plans discussed with the patient and  son at bedside and they arein agreement.  CODE STATUS: Full  TOTAL TIME TAKING CARE OF THIS PATIENT: 33 minutes.  POSSIBLE D/C for 2-3  days, DEPENDING ON CLINICAL CONDITION.   [name] M.D on 12/06/2018 at 10:22 PM  Between 7am to 6pm - Pager - [phone] After 6pm go to [email or web address] - password [email or web address] [facility]  Office  [phone]  CC: Primary care physician; Idelle Crouch, MD  Note: This dictation was prepared with Dragon dictation along with smaller phrase technology. Any transcriptional errors that result from this process are unintentional.

## 2018-12-06 NOTE — Progress Notes (Addendum)
SURGICAL PROGRESS NOTE  Patient seen and examined as described below with surgical PA-C, Ardell Isaacs.  Assessment/Plan: 84 y.o. male with resolving post-surgical ileus and acute malnutrition 4 Days Post-Op s/p exploratory laparotomy with lysis of a post-surgical single adhesion for SBO not resolving despite non-operative management, complicated also by resolved AKI with a solitary kidney and by comorbidities including advanced chronological age, HTN, HLD, CAD s/p remote PCI without stent for MI, CKD with solitary kidney, hypothyroidism, and vertigo.   - continue TPN for now  - monitor abdominal exam and bowel function  - pain control as needed (minimize narcotics as able)  - diet advanced to clear liquids + Boost Breeze clear liquids nutritional supplements   - medical management of comorbidities as per medical team (appreciated)  - DVT prophylaxis, continued ambulation encouraged   All of the above findings and recommendations were discussed with the patient, patient's family, and the medical team, and all of patient's and family's questions were answered to their expressed satisfaction.  Thank you for the opportunity to participate in this patient's care.  -- Marilynne Drivers Rosana Hoes, MD, [place]: [facility] - Partnering for exceptional care. Office: [place] Hospital Day(s): 8.   Post op day(s): 4 Days Post-Op.   Interval History: Patient seen and examined, no acute events or new complaints overnight. Patient reports that he did have flatus this morning when urinating and with a stool after receiving an enema yesterday. He does reports abdominal soreness however his distension has improved. No complaints of fever, chills, nausea, or emesis. TPN was initiated yesterday. Mobilizing well.  Review of Systems:  Constitutional: denies fever, chills  Gastrointestinal: + abdominal sorenes,  denied N/V, or diarrhea/and bowel function as per interval history Integumentary: denies any other rashes or skin discolorations except surgical incisions  Vital signs in last 24 hours: [min-max] current  Temp:  [98 F (36.7 C)-98.5 F (36.9 C)] 98.4 F (36.9 C) (01/14 0339) Pulse Rate:  [46-56] 46 (01/14 0339) Resp:  [16-18] 18 (01/14 0339) BP: (127-144)/(56-68) 127/56 (01/14 0339) SpO2:  [95 %-100 %] 95 % (01/14 0339) Weight:  [61.3 kg] 61.3 kg (01/14 0500)     Height: 5\' 9"  (175.3 cm) Weight: 61.3 kg BMI (Calculated): 19.95   Intake/Output this shift:  No intake/output data recorded.   Intake/Output last 2 shifts:  @IOLAST2SHIFTS @   Physical Exam:  Constitutional: alert, cooperative and no distress  Respiratory: breathing non-labored at rest  Gastrointestinal: soft, non-tender, improved distension Integumentary: midline laparotomy incisions is CDI, surrounding ecchymosis, no erythema or drainage  Labs:  CBC Latest Ref Rng & Units 12/05/2018 12/03/2018 11/30/2018  WBC 4.0 - 10.5 K/uL 10.2 13.3(H) 8.4  Hemoglobin 13.0 - 17.0 g/dL 10.9(L) 12.8(L) 13.6  Hematocrit 39.0 - 52.0 % 33.5(L) 39.2 41.1  Platelets 150 - 400 K/uL 244 254 252   CMP Latest Ref Rng & Units 12/06/2018 12/05/2018 12/05/2018  Glucose 70 - 99 mg/dL 168(H) 100(H) -  BUN 8 - 23 mg/dL 29(H) 26(H) -  Creatinine 0.61 - 1.24 mg/dL 1.00 0.98 0.94  Sodium 135 - 145 mmol/L 147(H) 148(H) -  Potassium 3.5 - 5.1 mmol/L 3.3(L) 3.8 -  Chloride 98 - 111 mmol/L 117(H) 120(H) -  CO2 22 - 32 mmol/L 25 23 -  Calcium 8.9 - 10.3 mg/dL 7.8(L) 8.2(L) -  Total Protein 6.5 - 8.1 g/dL - - -  Total Bilirubin 0.3 - 1.2 mg/dL - - -  Alkaline  Phos 38 - 126 U/L - - -  AST 15 - 41 U/L - - -  ALT 0 - 44 U/L - - -   Assessment/Plan: (ICD-10's: K76.21) 84 y.o. male with improved abdominal distension and signs of bowel function return who is 4 Days Post-Ops/p exploratory laparotomy and lysis of adhesion for small bowel obstruction, complicated  by pertinent comorbidities including advanced chronological age, HTN, HLD, CAD s/p remote PCI without stent for MI, CKD with solitary kidney, hypothyroidism, and vertigo..   - Start clear liquid diet + nutritional supplementation, IVF  - Continue TPN (wean when tolerating full liquids)   - Monitor on-going bowel function and abdominal examination             - Pain control as needed (minimize narcotics)  - Monitor electrolytes             - Mobilization encouraged             - DVT prophylaxis   All of the above findings and recommendations were discussed with the patient, patient's family, and the medical team, and all of patient's and family's questions were answered to their expressed satisfaction.  -- Edison Simon, PA-C [facility] 12/06/2018, 8:22 AM [phone] M-F: 7am - 4pm

## 2018-12-07 LAB — BASIC METABOLIC PANEL
Anion gap: 4 — ABNORMAL LOW (ref 5–15)
BUN: 25 mg/dL — ABNORMAL HIGH (ref 8–23)
CO2: 23 mmol/L (ref 22–32)
CREATININE: 0.85 mg/dL (ref 0.61–1.24)
Calcium: 7.8 mg/dL — ABNORMAL LOW (ref 8.9–10.3)
Chloride: 116 mmol/L — ABNORMAL HIGH (ref 98–111)
GFR calc Af Amer: >60 mL/min (ref >60)
GFR calc non Af Amer: >60 mL/min (ref >60)
Glucose, Bld: 143 mg/dL — ABNORMAL HIGH (ref 70–99)
Potassium: 3.3 mmol/L — ABNORMAL LOW (ref 3.5–5.1)
Sodium: 143 mmol/L (ref 135–145)

## 2018-12-07 LAB — GLUCOSE, CAPILLARY
Glucose-Capillary: 144 mg/dL — ABNORMAL HIGH (ref 70–99)
Glucose-Capillary: 128 mg/dL — ABNORMAL HIGH (ref 70–99)
Glucose-Capillary: 100 mg/dL — ABNORMAL HIGH (ref 70–99)

## 2018-12-07 LAB — PHOSPHORUS: Phosphorus: 2.4 mg/dL — ABNORMAL LOW (ref 2.5–4.6)

## 2018-12-07 LAB — MAGNESIUM: Magnesium: 1.7 mg/dL (ref 1.7–2.4)

## 2018-12-07 MED ORDER — FAT EMULSION PLANT BASED 20 % IV EMUL
250.0000 mL | INTRAVENOUS | Status: DC
  Filled 2018-12-07: qty 250

## 2018-12-07 MED ORDER — POTASSIUM CHLORIDE CRYS ER 20 MEQ PO TBCR
40.0000 meq | EXTENDED_RELEASE_TABLET | Freq: Two times a day (BID) | ORAL | Status: AC
  Administered 2018-12-07 (×2): 40 meq via ORAL
  Filled 2018-12-07 (×2): qty 2

## 2018-12-07 MED ORDER — TRACE MINERALS CR-CU-MN-SE-ZN 10-1000-500-60 MCG/ML IV SOLN
INTRAVENOUS | Status: DC
  Filled 2018-12-07: qty 960

## 2018-12-07 MED ORDER — K PHOS MONO-SOD PHOS DI & MONO 155-852-130 MG PO TABS
500.0000 mg | ORAL_TABLET | ORAL | Status: AC
  Administered 2018-12-07 (×2): 500 mg via ORAL
  Filled 2018-12-07 (×2): qty 2

## 2018-12-07 MED ORDER — ENSURE ENLIVE PO LIQD
237.0000 mL | Freq: Two times a day (BID) | ORAL | Status: DC
  Administered 2018-12-07 (×2): 237 mL via ORAL

## 2018-12-07 NOTE — Anesthesia Postprocedure Evaluation (Signed)
Anesthesia Post Note  Patient: [facility]  Procedure(s) Performed: EXPLORATORY LAPAROTOMY, LYSIS OF ADHESIONS, POSSIBLE SBO (N/A )  Patient location during evaluation: PACU Anesthesia Type: General Level of consciousness: awake and alert Pain management: pain level controlled Vital Signs Assessment: post-procedure vital signs reviewed and stable Respiratory status: spontaneous breathing, nonlabored ventilation, respiratory function stable and patient connected to nasal cannula oxygen Cardiovascular status: stable and blood pressure returned to baseline Postop Assessment: no apparent nausea or vomiting Anesthetic complications: no     Last Vitals:  Vitals:   12/07/18 1044 12/07/18 1202  BP: (!) 129/55 124/70  Pulse: 71 67  Resp:  16  Temp:  36.4 C  SpO2:  100%    Last Pain:  Vitals:   12/07/18 1202  TempSrc: Axillary  PainSc:                  [name]

## 2018-12-07 NOTE — Progress Notes (Addendum)
[place] Hospital Day(s): 9.   Post op day(s): 5 Days Post-Op.   Interval History: Patient seen and examined, no acute events or new complaints overnight. Patient reports that he had about 7 loose bowel movements throughout the course of the day and overnight. He endorses incisional soreness but denied any fever, chills, nausea, or emesis. He did feel distension with clear liquid diet yesterday however this resolved after his multiple BMs yesterday. Continues to mobilize on the unit well. No additional complaints.   Review of Systems:  Constitutional: denies fever, chills  Gastrointestinal: + abdominal soreness, denied N/V, or diarrhea/and bowel function as per interval history Integumentary: denies any other rashes or skin discolorations except surgical incisions  Vital signs in last 24 hours: [min-max] current  Temp:  [97.7 F (36.5 C)-98.6 F (37 C)] 98.5 F (36.9 C) (01/15 0600) Pulse Rate:  [55-61] 56 (01/15 0600) Resp:  [16-18] 17 (01/15 0600) BP: (117-146)/(53-74) 117/56 (01/15 0600) SpO2:  [97 %-100 %] 97 % (01/15 0600)     Height: 5\' 9"  (175.3 cm) Weight: 61.3 kg BMI (Calculated): 19.95   Intake/Output this shift:  No intake/output data recorded.   Intake/Output last 2 shifts:  @IOLAST2SHIFTS @   Physical Exam:  Constitutional: alert, cooperative and no distress  Respiratory: breathing non-labored at rest  Gastrointestinal: soft, non-tender, non-distended this morning Integumentary: midline laparotomy incisions is CDI, surrounding ecchymosis, no erythema or drainage   Labs:  CBC Latest Ref Rng & Units 12/05/2018 12/03/2018 11/30/2018  WBC 4.0 - 10.5 K/uL 10.2 13.3(H) 8.4  Hemoglobin 13.0 - 17.0 g/dL 10.9(L) 12.8(L) 13.6  Hematocrit 39.0 - 52.0 % 33.5(L) 39.2 41.1  Platelets 150 - 400 K/uL 244 254 252   CMP Latest Ref Rng & Units 12/07/2018 12/06/2018 12/05/2018  Glucose 70 - 99 mg/dL 143(H) 168(H) 100(H)  BUN 8 - 23 mg/dL  25(H) 29(H) 26(H)  Creatinine 0.61 - 1.24 mg/dL 0.85 1.00 0.98  Sodium 135 - 145 mmol/L 143 147(H) 148(H)  Potassium 3.5 - 5.1 mmol/L 3.3(L) 3.3(L) 3.8  Chloride 98 - 111 mmol/L 116(H) 117(H) 120(H)  CO2 22 - 32 mmol/L 23 25 23   Calcium 8.9 - 10.3 mg/dL 7.8(L) 7.8(L) 8.2(L)  Total Protein 6.5 - 8.1 g/dL - - -  Total Bilirubin 0.3 - 1.2 mg/dL - - -  Alkaline Phos 38 - 126 U/L - - -  AST 15 - 41 U/L - - -  ALT 0 - 44 U/L - - -     Assessment/Plan: (ICD-10's: K35.2) 84 y.o. male with mild hypokalemia and improved abdominal distension and signs of returned bowel function who is overall doing well 5 Days Post-Op s/p exploratory laparotomy and lysis of adhesionfor small bowel obstruction, complicated by pertinent comorbidities includingadvanced chronological age, HTN, HLD, CAD s/p remote PCI without stent for MI, CKD with solitary kidney, hypothyroidism, and vertigo.   - Advance to full liquids + nutritional supplementation  - Can begin to wean TPN today (appreciate dietitian and pharmacy help)  - Monitor on-going bowel function and abdominal examination - Pain control as needed (minimize narcotics)             - Monitor electrolytes and replete as needed - Mobilization encouraged - DVT prophylaxis    - Discharge Planning: If continues to tolerate a diet, hopefully home in next 24-48 hours.   All of the above findings and recommendations were discussed with the patient, patient's family, and the medical team, and all of patient's and  family's questions were answered to their expressed satisfaction.  -- Edison Simon, PA-C  [facility] 12/07/2018, 8:36 AM [phone] M-F: 7am - 4pm

## 2018-12-07 NOTE — Consult Note (Signed)
PHARMACY - ADULT TOTAL PARENTERAL NUTRITION CONSULT NOTE   Pharmacy Consult for TPN Indication: severe malnutrition  Patient Measurements: Height: 5\' 9"  (175.3 cm) Weight: 135 lb 2.3 oz (61.3 kg) IBW/kg (Calculated) : 70.7 TPN AdjBW (KG): 61 Body mass index is 19.96 kg/m.  Assessment: 84 y.o. male with post-operative ileus, s/p exploratory laparotomy and lysis of adhesion for small bowel obstruction 6/27, complicated by pertinent comorbidities including HTN, HLD, CAD s/p remote PCI without stent for MI, CKD III with congenital solitary kidney, hypothyroidism and vertigo.  Patient has a 13lb(9%) wt loss over the past year; this is not significant. Pt reports nausea/vomiting and unable to keep anything down since 1/3. Pt has been NPO since admit. Pt had NGT placed which was removed 1/12. Plan is to start TPN today; PICC line ordered. Pt likely at high refeed risk; plan to replace phosphorus today prior to TPN initiation.  GI: LBM 1/9 Endo: SSI Insulin requirements in the past 24 hours: 4 units Lytes:K 3.3 replaced with oral KCl 40 mEq BID x2, phos 2.4 replaced with K-phos neutral 500mg  q4h x2, other electrolytes wnl Renal: Scr 2.39------><1(baseline 1.1-1.2) Pulm: Cards:  Hepatobil: Neuro: ID:WBC 11.7>10.1>wnl Zosyn 1/6>1/10  TPN Access: 12/05/17 TPN start date: 12/05/17 Nutritional Goals (per RD recommendation on 01/13): KCal: 1853 kcal/day Protein: 78 grams/day Fluid: 1800 ml  Goal TPN rate is 65 ml/hr (provides 90% of their needs )  Current Nutrition: none  Plan:  -Continue E 5/20 TPN at 33mL/hr. It is possible that the patient can come off of the TPN tomorrow due to his improving clinical picture, therefore rate is not being advanced. I discussed this with surgery and they are in agreement -Continue 20% lipid emulsion at 20 ml/hr -This TPN provides 48 g of protein, 192 g of dextrose, and 48 g of lipids which provides 1140 kCals per day, meeting 62% of patient  needs Electrolytes in TPN: no additional -Add MVI, trace elements and IV thiamine 100mg  daily for 3 days (last day of thiamine supplementation)  to TPN -Sensitive Q6h SSI and adjust as needed -NS IVMF  at 35 ml/hr -Monitor TPN labs F/U 1/16  Dallie Piles, PharmD 12/07/2018,10:06 AM

## 2018-12-07 NOTE — Progress Notes (Addendum)
[place] at [place] NAME: Daniel [facility]    MR#:  [id number]  DATE OF BIRTH:  1935-06-07  SUBJECTIVE:  He is denying abdominal pain today having loose bowel movements no nausea or vomiting  REVIEW OF SYSTEMS:    Review of Systems  Constitutional: Negative for fever, chills weight loss HENT: Negative for ear pain, nosebleeds, congestion, facial swelling, rhinorrhea, neck pain, neck stiffness and ear discharge.   Respiratory: Negative for cough, shortness of breath, wheezing  Cardiovascular: Negative for chest pain, palpitations and leg swelling.  Gastrointestinal: Negative for heartburn, has some abdominal discomfort, denies vomiting, positive loose BMs Genitourinary: Negative for dysuria, urgency, frequency, hematuria Musculoskeletal: Negative for back pain or joint pain Neurological: Negative for dizziness, seizures, syncope, focal weakness,  numbness and headaches.  Hematological: Does not bruise/bleed easily.  Psychiatric/Behavioral: Negative for hallucinations, confusion, dysphoric mood    Tolerating Diet: npo      DRUG ALLERGIES:  No Known Allergies  VITALS:  Blood pressure 124/70, pulse 67, temperature 97.6 F (36.4 C), temperature source Axillary, resp. rate 16, height 5\' 9"  (1.753 m), weight 61.3 kg, SpO2 100 %.  PHYSICAL EXAMINATION:  Constitutional: Appears well-developed and well-nourished. No distress. HENT: Normocephalic. . NG tube intact connected to suction eyes: Conjunctivae and EOM are normal. PERRLA, no scleral icterus.  Neck: Normal ROM. Neck supple. No JVD. No tracheal deviation. CVS: RRR, S1/S2 +, no murmurs, no gallops, no carotid bruit.  Pulmonary: Effort and breath sounds normal, no stridor, rhonchi, wheezes, rales.  Abdominal: Abdomen is mildly distended no guarding or rebound diminished bowel sounds Musculoskeletal: Normal range of motion. No edema and no tenderness.  Neuro: Alert. CN 2-12 grossly  intact. No focal deficits. Skin: Skin is warm and dry. No rash noted. Psychiatric: Normal mood and affect.      LABORATORY PANEL:   CBC Recent Labs  Lab 12/05/18 0359  WBC 10.2  HGB 10.9*  HCT 33.5*  PLT 244   ------------------------------------------------------------------------------------------------------------------  Chemistries  Recent Labs  Lab 12/07/18 0418  NA 143  K 3.3*  CL 116*  CO2 23  GLUCOSE 143*  BUN 25*  CREATININE 0.85  CALCIUM 7.8*  MG 1.7   ------------------------------------------------------------------------------------------------------------------  Cardiac Enzymes No results for input(s): TROPONINI in the last 168 hours. ------------------------------------------------------------------------------------------------------------------  RADIOLOGY:  No results found.   ASSESSMENT AND PLAN:   84 year old male with history of chronic kidney disease stage III with solitary kidney and hypertension who presented to the emergency room with abdominal pain.  1.  Abdominal pain with partial small bowel obstruction: Status post exploratory laparotomy Continue management  per surgery Plan for full liquid diet to be advanced by surgery t Wean down TPN per surgery Encourage patient to ambulate in the hallway   2.  Acute kidney injury on chronic kidney disease stage III with solitary kidney- Resolved with IV fluids  3. hypertension: Continue atenolol  4.  Hypothyroidism: Continue Synthroid  5  Hyperlipidemia: Continue statin  6.  Severe protein calorie malnutrition-seen by dietitian Patient is on TPN  Monitor electrolytes    Management plans discussed with the patient and son at bedside and they arein agreement.  CODE STATUS: Full  TOTAL TIME TAKING CARE OF THIS PATIENT: 33 minutes.     POSSIBLE D/C for 2-3  days, DEPENDING ON CLINICAL CONDITION.   [name] M.D on 12/07/2018 at 2:34 PM  Between 7am to 6pm - Pager -  [phone] After 6pm go to [email or web address] - password  [email or web address]  [facility]  Office  [phone]  CC: Primary care physician; Idelle Crouch, MD  Note: This dictation was prepared with Dragon dictation along with smaller phrase technology. Any transcriptional errors that result from this process are unintentional.

## 2018-12-08 LAB — COMPREHENSIVE METABOLIC PANEL
ALT: 27 U/L (ref 0–44)
AST: 31 U/L (ref 15–41)
Albumin: 2.2 g/dL — ABNORMAL LOW (ref 3.5–5.0)
Alkaline Phosphatase: 41 U/L (ref 38–126)
Anion gap: 3 — ABNORMAL LOW (ref 5–15)
BUN: 20 mg/dL (ref 8–23)
CO2: 24 mmol/L (ref 22–32)
Calcium: 7.6 mg/dL — ABNORMAL LOW (ref 8.9–10.3)
Chloride: 114 mmol/L — ABNORMAL HIGH (ref 98–111)
Creatinine, Ser: 0.94 mg/dL (ref 0.61–1.24)
GFR calc Af Amer: >60 mL/min (ref >60)
GFR calc non Af Amer: >60 mL/min (ref >60)
Glucose, Bld: 109 mg/dL — ABNORMAL HIGH (ref 70–99)
Potassium: 4 mmol/L (ref 3.5–5.1)
Sodium: 141 mmol/L (ref 135–145)
Total Bilirubin: 0.6 mg/dL (ref 0.3–1.2)
Total Protein: 4.5 g/dL — ABNORMAL LOW (ref 6.5–8.1)

## 2018-12-08 LAB — GLUCOSE, CAPILLARY
Glucose-Capillary: 101 mg/dL — ABNORMAL HIGH (ref 70–99)
Glucose-Capillary: 106 mg/dL — ABNORMAL HIGH (ref 70–99)

## 2018-12-08 LAB — MAGNESIUM: Magnesium: 1.6 mg/dL — ABNORMAL LOW (ref 1.7–2.4)

## 2018-12-08 LAB — PHOSPHORUS: Phosphorus: 2.5 mg/dL (ref 2.5–4.6)

## 2018-12-08 MED ORDER — OXYCODONE HCL 5 MG PO TABS: 5.0000 mg | ORAL_TABLET | Freq: Four times a day (QID) | ORAL | 0 refills | Status: DC | PRN

## 2018-12-08 NOTE — Discharge Summary (Signed)
[facility] SURGICAL DISCHARGE SUMMARY   Patient ID: Daniel [facility] MRN: [id number] DOB/AGE: 1935-02-24 84 y.o.  Admit date: 11/28/2018 Discharge date: 12/08/2018  Discharge Diagnoses Patient Active Problem List   Diagnosis Date Noted  . Protein-calorie malnutrition, severe 12/05/2018  . Small bowel obstruction ([facility]) 11/28/2018    Consultants Medicine Dietitian  Procedures -- 12/02/2018: Laparotomy w Lysis of adhesions   HPI: 84 y.o. male presented to [facility] ED on 01/06 for evaluation of abdominal pain, bloating, and nausea with non-bloody emesis. Patient reports he developed these symptoms yesterday and has not vomited yet today, though continues to describe persistent belching without BM's since 2 days ago despite ongoing flatus. Patient previously 10 years ago underwent robotic laparoscope-assisted prostatectomy for prostate cancer and has a history of laparoscopic cholecystectomy, though patient does not recall when he underwent the latter. His otherwise denies any fever/chills, recent unintentional weight loss, CP or SOB, and his son (an Therapist, sports with [facility] Lewis) describes his father as quite active s/p coronary angioplasty without stenting at [facility] for MI 20 years ago and a potentially congenital solitary kidney discovered during workup for prostate cancer. Patient denies any prior such SBO's.  Lewis Course: His SBO was managed conservatively with NGT, IVF, NPO for 4 days however, the patient did not resolve. He underwent CT on 12/01/2018 which was concerning for transition point point and possible adhesion. Informed consent was obtained and documented, and patient underwent uneventful laparotomy with lysis of adhesions (Dr Dahlia Byes, 12/02/2018).  Post-operatively, the patient had a mild post-operative ileus but this resolved quickly. On POD2 the patient's NGT was removed, however he still was not passing flatus. At this point he was 7 days NPO and TPN was intiiated  which he required for 48 hours. Otherwise, the remainder of patient's Lewis course was essentially unremarkable, and discharge planning was initiated accordingly with patient safely able to be discharged home with appropriate discharge instructions, pain control, and outpatient follow-up after all of his and his family's questions were answered to their expressed satisfaction.  Discharge Condition: Good   Physical Examination:  Constitutional: alert, cooperative and no distress  Respiratory: breathing non-labored at rest  Gastrointestinal: soft, non-tender,non-distended this morning Integumentary:midline laparotomy incisions is CDI, surrounding ecchymosis, no erythema or drainage   Allergies as of 12/08/2018   No Known Allergies     Medication List    TAKE these medications   amoxicillin-clavulanate 875-125 MG tablet Commonly known as:  AUGMENTIN Take 1 tablet by mouth 2 (two) times daily.   aspirin EC 81 MG tablet Take 1 tablet by mouth daily.   atenolol 50 MG tablet Commonly known as:  TENORMIN Take 1 tablet by mouth daily.   atorvastatin 10 MG tablet Commonly known as:  LIPITOR Take 10 mg by mouth daily.   ondansetron 4 MG tablet Commonly known as:  ZOFRAN Take 1 tablet (4 mg total) by mouth every 8 (eight) hours as needed for nausea or vomiting.   oxyCODONE 5 MG immediate release tablet Commonly known as:  Oxy IR/ROXICODONE Take 1 tablet (5 mg total) by mouth every 6 (six) hours as needed for severe pain or breakthrough pain.   SYNTHROID 75 MCG tablet Generic drug:  levothyroxine Take 1 tablet by mouth every morning.   traMADol 50 MG tablet Commonly known as:  ULTRAM Take 1 tablet (50 mg total) by mouth every 6 (six) hours as needed.   TRAVATAN Z 0.004 % Soln ophthalmic solution Generic drug:  Travoprost (BAK Free) Place 1  drop into both eyes at bedtime.   vitamin C 500 MG tablet Commonly known as:  ASCORBIC ACID Take 1 tablet by mouth daily.         Follow-up Information    Pabon, [place] F, MD. Schedule an appointment as soon as possible for a visit in 1 week(s).   Specialty:  General Surgery Why:  s/p ex lap for SBO Contact information: [address] [place] [address] [phone]            -- [name] , PA-C  [facility]  12/08/2018, 10:20 AM [phone] M-F: 7am - 4pm

## 2018-12-08 NOTE — Progress Notes (Signed)
[place] at [place] NAME: Daniel Lewis    MR#:  [id number]  DATE OF BIRTH:  [birth year]  SUBJECTIVE:  Pt doing well denies any complaints  REVIEW OF SYSTEMS:    Review of Systems  Constitutional: Negative for fever, chills weight loss HENT: Negative for ear pain, nosebleeds, congestion, facial swelling, rhinorrhea, neck pain, neck stiffness and ear discharge.   Respiratory: Negative for cough, shortness of breath, wheezing  Cardiovascular: Negative for chest pain, palpitations and leg swelling.  Gastrointestinal: Negative for heartburn, has some abdominal discomfort, denies vomiting, positive loose BMs Genitourinary: Negative for dysuria, urgency, frequency, hematuria Musculoskeletal: Negative for back pain or joint pain Neurological: Negative for dizziness, seizures, syncope, focal weakness,  numbness and headaches.  Hematological: Does not bruise/bleed easily.  Psychiatric/Behavioral: Negative for hallucinations, confusion, dysphoric mood    Tolerating Diet: npo      DRUG ALLERGIES:  No Known Allergies  VITALS:  Blood pressure (!) 125/59, pulse (!) 57, temperature 98.3 F (36.8 C), temperature source Oral, resp. rate 16, height 5\' 9"  (1.753 m), weight 61.3 kg, SpO2 96 %.  PHYSICAL EXAMINATION:  Constitutional: Appears well-developed and well-nourished. No distress. HENT: Normocephalic. . NG tube intact connected to suction eyes: Conjunctivae and EOM are normal. PERRLA, no scleral icterus.  Neck: Normal ROM. Neck supple. No JVD. No tracheal deviation. CVS: RRR, S1/S2 +, no murmurs, no gallops, no carotid bruit.  Pulmonary: Effort and breath sounds normal, no stridor, rhonchi, wheezes, rales.  Abdominal: Abdomen is mildly distended no guarding or rebound diminished bowel sounds Musculoskeletal: Normal range of motion. No edema and no tenderness.  Neuro: Alert. CN 2-12 grossly intact. No focal deficits. Skin: Skin is warm  and dry. No rash noted. Psychiatric: Normal mood and affect.      LABORATORY PANEL:   CBC Recent Labs  Lab 12/05/18 0359  WBC 10.2  HGB 10.9*  HCT 33.5*  PLT 244   ------------------------------------------------------------------------------------------------------------------  Chemistries  Recent Labs  Lab 12/08/18 0556  NA 141  K 4.0  CL 114*  CO2 24  GLUCOSE 109*  BUN 20  CREATININE 0.94  CALCIUM 7.6*  MG 1.6*  AST 31  ALT 27  ALKPHOS 41  BILITOT 0.6   ------------------------------------------------------------------------------------------------------------------  Cardiac Enzymes No results for input(s): TROPONINI in the last 168 hours. ------------------------------------------------------------------------------------------------------------------  RADIOLOGY:  No results found.   ASSESSMENT AND PLAN:   84 year old male with history of chronic kidney disease stage III with solitary kidney and hypertension who presented to the emergency room with abdominal pain.  1.  Abdominal pain with partial small bowel obstruction: Status post exploratory laparotomy Continue management  per surgery Diet advanced  Plan for d/c later today  Continue all home medications With surgery f/u    2.  Acute kidney injury on chronic kidney disease stage III with solitary kidney- Resolved with IV fluids  3. hypertension: Continue atenolol  4.  Hypothyroidism: Continue Synthroid  5  Hyperlipidemia: Continue statin  6.  Severe protein calorie malnutrition-seen by dietitian Patient is on TPN  Monitor electrolytes    Management plans discussed with the patient and son at bedside and they arein agreement.  CODE STATUS: Full  TOTAL TIME TAKING CARE OF THIS PATIENT: 33 minutes.     POSSIBLE D/C for 2-3  days, DEPENDING ON CLINICAL CONDITION.   [name] M.D on 12/08/2018 at 11:06 AM  Between 7am to 6pm - Pager - [phone] After 6pm go to  [email or web address] - password  [email or web address]  [facility]  Office  [phone]  CC: Primary care physician; Idelle Crouch, MD  Note: This dictation was prepared with Dragon dictation along with smaller phrase technology. Any transcriptional errors that result from this process are unintentional.

## 2018-12-08 NOTE — Progress Notes (Signed)
Discharge instructions reviewed with patient including followup visits and new medications.  Understanding was verbalized and all questions were answered.  PICC removed without complication; patient tolerated well.  Patient discharged home via wheelchair in stable condition escorted by volunteer staff.

## 2018-12-08 NOTE — Discharge Instructions (Signed)
In addition to included general post-operative instructions for exploratory laparotomy,  Diet: Resume home heart healthy diet.   Activity: No heavy lifting >20 pounds (children, pets, laundry, garbage) or strenuous activity until follow-up, but light activity and walking are encouraged. Do not drive or drink alcohol if taking narcotic pain medications.  Wound care: You may shower/get incision wet with soapy water and pat dry (do not rub incisions), but no baths or submerging incision underwater until follow-up.   Medications: Resume all home medications. For mild to moderate pain: acetaminophen (Tylenol). Combining Tylenol with alcohol can substantially increase your risk of causing liver disease. Narcotic pain medications, if prescribed, can be used for severe pain, though may cause nausea, constipation, and drowsiness. Do not combine Tylenol and Percocet (or similar) within a 6 hour period as Percocet (and similar) contain(s) Tylenol. If you do not need the narcotic pain medication, you do not need to fill the prescription.  Call office [phone] / [phone]) at any time if any questions, worsening pain, fevers/chills, bleeding, drainage from incision site, or other concerns.

## 2018-12-08 NOTE — Care Management Important Message (Signed)
Copy of signed Medicare IM left with patient in room. 

## 2018-12-14 ENCOUNTER — Encounter: Payer: Self-pay | Admitting: Surgery

## 2018-12-14 ENCOUNTER — Ambulatory Visit (INDEPENDENT_AMBULATORY_CARE_PROVIDER_SITE_OTHER): Payer: PPO | Admitting: Surgery

## 2018-12-14 ENCOUNTER — Other Ambulatory Visit: Payer: Self-pay

## 2018-12-14 VITALS — BP 165/81 | HR 54 | Temp 97.7°F | Resp 16 | Ht 69.0 in | Wt 143.4 lb

## 2018-12-14 DIAGNOSIS — Z09 Encounter for follow-up examination after completed treatment for conditions other than malignant neoplasm: Secondary | ICD-10-CM

## 2018-12-14 NOTE — Patient Instructions (Signed)
The patient is aware to call back for any questions or new concerns. Steri strips will gradually come off. Resume activities as tolerated.

## 2018-12-14 NOTE — Progress Notes (Signed)
S/p Lap LOA Doing well Ambulating Taking PO, + Flatus  PE NAD Abd: soft, NT, staples removed by RN. No infection or peritonitis  A/P Doing very well rtc prn

## 2018-12-19 DIAGNOSIS — H6123 Impacted cerumen, bilateral: Secondary | ICD-10-CM | POA: Diagnosis not present

## 2018-12-19 DIAGNOSIS — H903 Sensorineural hearing loss, bilateral: Secondary | ICD-10-CM | POA: Diagnosis not present

## 2019-01-04 DIAGNOSIS — H401231 Low-tension glaucoma, bilateral, mild stage: Secondary | ICD-10-CM | POA: Diagnosis not present

## 2019-02-07 DIAGNOSIS — R05 Cough: Secondary | ICD-10-CM | POA: Diagnosis not present

## 2019-02-07 DIAGNOSIS — J209 Acute bronchitis, unspecified: Secondary | ICD-10-CM | POA: Diagnosis not present

## 2019-05-03 DIAGNOSIS — R7309 Other abnormal glucose: Secondary | ICD-10-CM | POA: Diagnosis not present

## 2019-05-03 DIAGNOSIS — E039 Hypothyroidism, unspecified: Secondary | ICD-10-CM | POA: Diagnosis not present

## 2019-05-03 DIAGNOSIS — Z8546 Personal history of malignant neoplasm of prostate: Secondary | ICD-10-CM | POA: Diagnosis not present

## 2019-05-03 DIAGNOSIS — Z0000 Encounter for general adult medical examination without abnormal findings: Secondary | ICD-10-CM | POA: Diagnosis not present

## 2019-05-03 DIAGNOSIS — Z125 Encounter for screening for malignant neoplasm of prostate: Secondary | ICD-10-CM | POA: Diagnosis not present

## 2019-05-03 DIAGNOSIS — Z79899 Other long term (current) drug therapy: Secondary | ICD-10-CM | POA: Diagnosis not present

## 2019-05-03 DIAGNOSIS — E7800 Pure hypercholesterolemia, unspecified: Secondary | ICD-10-CM | POA: Diagnosis not present

## 2019-05-03 DIAGNOSIS — I10 Essential (primary) hypertension: Secondary | ICD-10-CM | POA: Diagnosis not present

## 2019-05-03 DIAGNOSIS — I2510 Atherosclerotic heart disease of native coronary artery without angina pectoris: Secondary | ICD-10-CM | POA: Diagnosis not present

## 2019-05-03 DIAGNOSIS — D649 Anemia, unspecified: Secondary | ICD-10-CM | POA: Diagnosis not present

## 2019-05-10 DIAGNOSIS — H401231 Low-tension glaucoma, bilateral, mild stage: Secondary | ICD-10-CM | POA: Diagnosis not present

## 2019-06-06 DIAGNOSIS — R05 Cough: Secondary | ICD-10-CM | POA: Diagnosis not present

## 2019-06-19 DIAGNOSIS — H903 Sensorineural hearing loss, bilateral: Secondary | ICD-10-CM | POA: Diagnosis not present

## 2019-06-19 DIAGNOSIS — H6123 Impacted cerumen, bilateral: Secondary | ICD-10-CM | POA: Diagnosis not present

## 2019-08-23 DIAGNOSIS — R05 Cough: Secondary | ICD-10-CM | POA: Diagnosis not present

## 2019-09-04 DIAGNOSIS — Z20828 Contact with and (suspected) exposure to other viral communicable diseases: Secondary | ICD-10-CM | POA: Diagnosis not present

## 2019-09-11 DIAGNOSIS — Z1159 Encounter for screening for other viral diseases: Secondary | ICD-10-CM | POA: Diagnosis not present

## 2019-09-13 DIAGNOSIS — R05 Cough: Secondary | ICD-10-CM | POA: Diagnosis not present

## 2019-09-13 DIAGNOSIS — R911 Solitary pulmonary nodule: Secondary | ICD-10-CM | POA: Diagnosis not present

## 2019-09-15 ENCOUNTER — Other Ambulatory Visit: Payer: Self-pay | Admitting: Pulmonary Disease

## 2019-09-15 DIAGNOSIS — R911 Solitary pulmonary nodule: Secondary | ICD-10-CM

## 2019-09-15 DIAGNOSIS — R05 Cough: Secondary | ICD-10-CM

## 2019-09-15 DIAGNOSIS — R053 Chronic cough: Secondary | ICD-10-CM

## 2019-09-20 ENCOUNTER — Ambulatory Visit
Admission: RE | Admit: 2019-09-20 | Discharge: 2019-09-20 | Disposition: A | Discharge status: Home / Self Care | Payer: PPO | Source: Ambulatory Visit | Attending: Pulmonary Disease | Admitting: Pulmonary Disease

## 2019-09-20 ENCOUNTER — Other Ambulatory Visit: Payer: Self-pay

## 2019-09-20 DIAGNOSIS — R053 Chronic cough: Secondary | ICD-10-CM

## 2019-09-20 DIAGNOSIS — R911 Solitary pulmonary nodule: Secondary | ICD-10-CM | POA: Insufficient documentation

## 2019-09-20 DIAGNOSIS — R05 Cough: Secondary | ICD-10-CM | POA: Insufficient documentation

## 2019-09-20 DIAGNOSIS — R918 Other nonspecific abnormal finding of lung field: Secondary | ICD-10-CM | POA: Diagnosis not present

## 2019-11-02 DIAGNOSIS — E7800 Pure hypercholesterolemia, unspecified: Secondary | ICD-10-CM | POA: Diagnosis not present

## 2019-11-02 DIAGNOSIS — I10 Essential (primary) hypertension: Secondary | ICD-10-CM | POA: Diagnosis not present

## 2019-11-02 DIAGNOSIS — R7309 Other abnormal glucose: Secondary | ICD-10-CM | POA: Diagnosis not present

## 2019-11-02 DIAGNOSIS — C61 Malignant neoplasm of prostate: Secondary | ICD-10-CM | POA: Diagnosis not present

## 2019-11-02 DIAGNOSIS — E039 Hypothyroidism, unspecified: Secondary | ICD-10-CM | POA: Diagnosis not present

## 2019-11-02 DIAGNOSIS — Z79899 Other long term (current) drug therapy: Secondary | ICD-10-CM | POA: Diagnosis not present

## 2019-11-02 DIAGNOSIS — I2510 Atherosclerotic heart disease of native coronary artery without angina pectoris: Secondary | ICD-10-CM | POA: Diagnosis not present

## 2019-11-29 DIAGNOSIS — H401231 Low-tension glaucoma, bilateral, mild stage: Secondary | ICD-10-CM | POA: Diagnosis not present

## 2019-11-29 DIAGNOSIS — H2513 Age-related nuclear cataract, bilateral: Secondary | ICD-10-CM | POA: Diagnosis not present

## 2019-12-18 DIAGNOSIS — H6123 Impacted cerumen, bilateral: Secondary | ICD-10-CM | POA: Diagnosis not present

## 2019-12-18 DIAGNOSIS — H903 Sensorineural hearing loss, bilateral: Secondary | ICD-10-CM | POA: Diagnosis not present

## 2020-01-29 DIAGNOSIS — L820 Inflamed seborrheic keratosis: Secondary | ICD-10-CM | POA: Diagnosis not present

## 2020-01-29 DIAGNOSIS — D692 Other nonthrombocytopenic purpura: Secondary | ICD-10-CM | POA: Diagnosis not present

## 2020-01-29 DIAGNOSIS — Z1283 Encounter for screening for malignant neoplasm of skin: Secondary | ICD-10-CM | POA: Diagnosis not present

## 2020-01-29 DIAGNOSIS — L570 Actinic keratosis: Secondary | ICD-10-CM | POA: Diagnosis not present

## 2020-01-29 DIAGNOSIS — L578 Other skin changes due to chronic exposure to nonionizing radiation: Secondary | ICD-10-CM | POA: Diagnosis not present

## 2020-04-29 ENCOUNTER — Telehealth: Payer: Self-pay | Admitting: Surgery

## 2020-04-29 ENCOUNTER — Ambulatory Visit (INDEPENDENT_AMBULATORY_CARE_PROVIDER_SITE_OTHER): Payer: PPO | Admitting: Surgery

## 2020-04-29 ENCOUNTER — Encounter: Payer: Self-pay | Admitting: Surgery

## 2020-04-29 ENCOUNTER — Other Ambulatory Visit: Payer: Self-pay

## 2020-04-29 VITALS — BP 158/68 | HR 54 | Temp 97.7°F | Resp 12 | Ht 68.0 in | Wt 145.4 lb

## 2020-04-29 DIAGNOSIS — K4091 Unilateral inguinal hernia, without obstruction or gangrene, recurrent: Secondary | ICD-10-CM

## 2020-04-29 NOTE — Progress Notes (Signed)
Outpatient Surgical Follow Up  04/29/2020  Daniel Lewis is an 85 y.o. male.   Chief Complaint  Patient presents with  . Follow-up    Left inguinal hernia     HPI: Daniel Lewis is an 85 year old male well-known to me with a prior history of laparotomy for small bowel obstruction last year on January 2020.  He has recovered very well from this comes in with intermittent left inguinal pain.  The pain is moderate intensity sharp and worsening with certain movements and with Valsalva.  No fevers no chills.  He did have a CT scan that have personally reviewed confirming the left inguinal hernia with some sigmoid colon next hernia sac.  No evidence of incarceration or strangulation.  He is otherwise relatively healthy and very functional for and obtained area.  He is able to perform more than 4 METS of activity without any shortness of breath or chest pain He already had a previous bilateral inguinal hernia repair by Dr. Rochel Brome several years ago.  Past Medical History:  Diagnosis Date  . Anemia   . ASHD (arteriosclerotic heart disease)   . Chronic kidney disease   . Erectile dysfunction   . Hyperlipidemia   . Hypertension   . Hypothyroidism   . MI (myocardial infarction) ([place])   . Prostate cancer ([place])   . Vertigo     Past Surgical History:  Procedure Laterality Date  . CHOLECYSTECTOMY    . COLONOSCOPY    . COLONOSCOPY WITH PROPOFOL N/A 12/16/2015   Procedure: COLONOSCOPY WITH PROPOFOL;  Surgeon: Manya Silvas, MD;  Location: [facility] ENDOSCOPY;  Service: Endoscopy;  Laterality: N/A;  . INGUINAL HERNIA REPAIR    . LAPAROTOMY N/A 12/02/2018   Procedure: EXPLORATORY LAPAROTOMY, LYSIS OF ADHESIONS, POSSIBLE SBO;  Surgeon: Jules Husbands, MD;  Location: [facility] ORS;  Service: General;  Laterality: N/A;  . PROSTATECTOMY      History reviewed. No pertinent family history.  Social History:  reports that he has never smoked. He has never used smokeless tobacco. He reports that he does  not drink alcohol or use drugs.  Allergies:  Allergies  Allergen Reactions  . Other     Medications reviewed.    ROS Full ROS performed and is otherwise negative other than what is stated in HPI   BP (!) 158/68   Pulse (!) 54   Temp 97.7 F (36.5 C)   Resp 12   Ht 5\' 8"  (1.727 m)   Wt 145 lb 6.4 oz (66 kg)   SpO2 97%   BMI 22.11 kg/m   Physical Exam Vitals and nursing note reviewed. Exam conducted with a chaperone present.  Constitutional:      General: He is not in acute distress.    Appearance: Normal appearance. He is normal weight.  Eyes:     General: No scleral icterus.       Right eye: No discharge.        Left eye: No discharge.  Neck:     Vascular: No carotid bruit.  Cardiovascular:     Rate and Rhythm: Normal rate and regular rhythm.  Pulmonary:     Effort: Pulmonary effort is normal. No respiratory distress.     Breath sounds: Normal breath sounds. No stridor.  Abdominal:     General: Abdomen is flat. There is no distension.     Palpations: Abdomen is soft. There is no mass.     Tenderness: There is no abdominal tenderness. There is no  right CVA tenderness, left CVA tenderness, guarding or rebound.     Hernia: A hernia is present.     Comments: Left reducible inguinal hernia mildly tender to palpation.  No peritonitis  Musculoskeletal:        General: Normal range of motion.     Cervical back: Normal range of motion and neck supple. No rigidity or tenderness.  Skin:    General: Skin is warm and dry.     Capillary Refill: Capillary refill takes less than 2 seconds.  Neurological:     General: No focal deficit present.     Mental Status: He is alert and oriented to person, place, and time.  Psychiatric:        Mood and Affect: Mood normal.        Behavior: Behavior normal.        Thought Content: Thought content normal.        Judgment: Judgment normal.         Assessment/Plan: Symptomatic recurrent left inguinal hernia.  Discussed with  the patient detail about her disease process and I definitely recommend repair of his hernia.  I do think that he is a good candidate for robotic approach.  Procedure discussed with the patient in detail.  Risks, benefits and possible indications including but not limited to, bleeding, infection, chronic pain, recurrence potential bowel injury.  He understands and wished to proceed. We also discussed the chance that if we find bilateral disease that we will go ahead and fix it during the same operative setting. Greater than 50% of the 40 minutes  visit was spent in counseling/coordination of care   Caroleen Hamman, MD [place] Surgeon

## 2020-04-29 NOTE — Patient Instructions (Signed)
Our surgery scheduler will contact you to schedule your surgery. Please have the [facility] Sheet available when she calls you.   Call the office if you have any questions or concerns.  Inguinal Hernia, Adult An inguinal hernia develops when fat or the intestines push through a weak spot in a muscle where your leg meets your lower abdomen (groin). This creates a bulge. This kind of hernia could also be:  In your scrotum, if you are male.  In folds of skin around your vagina, if you are male. There are three types of inguinal hernias:  Hernias that can be pushed back into the abdomen (are reducible). This type rarely causes pain.  Hernias that are not reducible (are incarcerated).  Hernias that are not reducible and lose their blood supply (are strangulated). This type of hernia requires emergency surgery. What are the causes? This condition is caused by having a weak spot in the muscles or tissues in the groin. This weak spot develops over time. The hernia may poke through the weak spot when you suddenly strain your lower abdominal muscles, such as when you:  Lift a heavy object.  Strain to have a bowel movement. Constipation can lead to straining.  Cough. What increases the risk? This condition is more likely to develop in:  Men.  Pregnant women.  People who: ? Are overweight. ? Work in jobs that require long periods of standing or heavy lifting. ? Have had an inguinal hernia before. ? Smoke or have lung disease. These factors can lead to long-lasting (chronic) coughing. What are the signs or symptoms? Symptoms may depend on the size of the hernia. Often, a small inguinal hernia has no symptoms. Symptoms of a larger hernia may include:  A lump in the groin area. This is easier to see when standing. It might not be visible when lying down.  Pain or burning in the groin. This may get worse when lifting, straining, or coughing.  A dull ache or a feeling of pressure in the  groin.  In men, an unusual lump in the scrotum. Symptoms of a strangulated inguinal hernia may include:  A bulge in your groin that is very painful and tender to the touch.  A bulge that turns red or purple.  Fever, nausea, and vomiting.  Inability to have a bowel movement or to pass gas. How is this diagnosed? This condition is diagnosed based on your symptoms, your medical history, and a physical exam. Your health care provider may feel your groin area and ask you to cough. How is this treated? Treatment depends on the size of your hernia and whether you have symptoms. If you do not have symptoms, your health care provider may have you watch your hernia carefully and have you come in for follow-up visits. If your hernia is large or if you have symptoms, you may need surgery to repair the hernia. Follow these instructions at home: Lifestyle  Avoid lifting heavy objects.  Avoid standing for long periods of time.  Do not use any products that contain nicotine or tobacco, such as cigarettes and e-cigarettes. If you need help quitting, ask your health care provider.  Maintain a healthy weight. Preventing constipation  Take actions to prevent constipation. Constipation leads to straining with bowel movements, which can make a hernia worse or cause a hernia repair to break down. Your health care provider may recommend that you: ? Drink enough fluid to keep your urine pale yellow. ? Eat foods that are high  in fiber, such as fresh fruits and vegetables, whole grains, and beans. ? Limit foods that are high in fat and processed sugars, such as fried or sweet foods. ? Take an over-the-counter or prescription medicine for constipation. General instructions  You may try to push the hernia back in place by very gently pressing on it while lying down. Do not try to force the bulge back in if it will not push in easily.  Watch your hernia for any changes in shape, size, or color. Get help right  away if you notice any changes.  Take over-the-counter and prescription medicines only as told by your health care provider.  Keep all follow-up visits as told by your health care provider. This is important. Contact a health care provider if:  You have a fever.  You develop new symptoms.  Your symptoms get worse. Get help right away if:  You have pain in your groin that suddenly gets worse.  You have a bulge in your groin that: ? Suddenly gets bigger and does not get smaller. ? Becomes red or purple or painful to the touch.  You are a man and you have a sudden pain in your scrotum, or the size of your scrotum suddenly changes.  You cannot push the hernia back in place by very gently pressing on it when you are lying down. Do not try to force the bulge back in if it will not push in easily.  You have nausea or vomiting that does not go away.  You have a fast heartbeat.  You cannot have a bowel movement or pass gas. These symptoms may represent a serious problem that is an emergency. Do not wait to see if the symptoms will go away. Get medical help right away. Call your local emergency services (911 in the [place] Summary  An inguinal hernia develops when fat or the intestines push through a weak spot in a muscle where your leg meets your lower abdomen (groin).  This condition is caused by having a weak spot in muscles or tissue in your groin.  Symptoms may depend on the size of the hernia, and they may include pain or swelling in your groin. A small inguinal hernia often has no symptoms.  Treatment may not be needed if you do not have symptoms. If you have symptoms or a large hernia, you may need surgery to repair the hernia.  Avoid lifting heavy objects. Also avoid standing for long amounts of time. This information is not intended to replace advice given to you by your health care provider. Make sure you discuss any questions you have with your health care  provider. Document Revised: 12/11/2017 Document Reviewed: 08/11/2017 Elsevier Patient Education  [place].

## 2020-04-29 NOTE — Telephone Encounter (Signed)
Pt has been advised of Pre-Admission date/time, COVID Testing date and Surgery date.  Surgery Date: 05/07/20 Preadmission Testing Date: 05/01/20 (phone 1p-5p) Covid Testing Date: 05/03/20 - patient advised to go to the [facility] ([place]) between 8a-1p   Patient has been made aware to call [phone], between 1-3:00pm the day before surgery, to find out what time to arrive for surgery.

## 2020-05-01 ENCOUNTER — Encounter
Admission: RE | Admit: 2020-05-01 | Discharge: 2020-05-01 | Disposition: A | Discharge status: Home / Self Care | Payer: PPO | Source: Ambulatory Visit | Attending: Surgery | Admitting: Surgery

## 2020-05-01 ENCOUNTER — Other Ambulatory Visit: Payer: Self-pay

## 2020-05-01 NOTE — Patient Instructions (Addendum)
Your procedure is scheduled on: 05/07/20 Report to [place] entrance then to Same Day Surgery. To find out your arrival time please call [phone] between 1PM - 3PM on 05/06/20.  Remember: Instructions that are not followed completely may result in serious medical risk,  up to and including death, or upon the discretion of your surgeon and anesthesiologist your  surgery may need to be rescheduled.     _X__ 1. Do not eat food after midnight the night before your procedure.                 No gum chewing or hard candies. You may drink clear liquids up to 2 hours                 before you are scheduled to arrive for your surgery- DO not drink clear                 liquids within 2 hours of the start of your surgery.                 Clear Liquids include:  water, apple juice without pulp, clear Gatorade, G2 or                  Gatorade Zero (avoid Red/Purple/Blue), Black Coffee or Tea (Do not add                 anything to coffee or tea). __x___2.   Complete the carbohydrate drink provided to you, 2 hours before arrival.  __X__2.  On the morning of surgery brush your teeth with toothpaste and water, you                may rinse your mouth with mouthwash if you wish.  Do not swallow any toothpaste of mouthwash.     _X__ 3.  No Alcohol for 24 hours before or after surgery.   _X__ 4.  Do Not Smoke or use e-cigarettes For 24 Hours Prior to Your Surgery.                 Do not use any chewable tobacco products for at least 6 hours prior to                 Surgery.  _X__  5.  Do not use any recreational drugs (marijuana, cocaine, heroin, ecstacy, MDMA or other)                For at least one week prior to your surgery.  Combination of these drugs with anesthesia                May have life threatening results.  ____  6.  Bring all medications with you on the day of surgery if instructed.   ____  7.  Notify your doctor if there is any change in your medical  condition      (cold, fever, infections).     Do not wear jewelry, make-up, hairpins, clips or nail polish. Do not wear lotions, powders, or perfumes. You may wear deodorant. Do not shave 48 hours prior to surgery. Men may shave face and neck. Do not bring valuables to the hospital.    [facility] is not responsible for any belongings or valuables.  Contacts, dentures or bridgework may not be worn into surgery. Leave your suitcase in the car. After surgery it may be brought to your room. For patients admitted to the hospital, discharge time is  determined by your treatment team.   Patients discharged the day of surgery will not be allowed to drive home.   Make arrangements for someone to be with you for the first 24 hours of your Same Day Discharge.       __x__ Take these medicines the morning of surgery with A SIP OF WATER:    1. atenolol (TENORMIN) 50 MG tablet  2. SYNTHROID 75 MCG tablet    __x__ Stop Coumadin/Plavix/aspirin as directed by physician   __x_ Stop Anti-inflammatories 7 days prior to procedure  __x__ Use CHG soap the night prior to surgery then morning of surgery   __x__ Stop supplements until after surgery.    ____ Bring C-Pap to the hospital.

## 2020-05-03 ENCOUNTER — Other Ambulatory Visit: Payer: Self-pay

## 2020-05-03 ENCOUNTER — Other Ambulatory Visit
Admission: RE | Admit: 2020-05-03 | Discharge: 2020-05-03 | Disposition: A | Discharge status: Home / Self Care | Payer: PPO | Source: Ambulatory Visit | Attending: Surgery | Admitting: Surgery

## 2020-05-03 DIAGNOSIS — R001 Bradycardia, unspecified: Secondary | ICD-10-CM | POA: Insufficient documentation

## 2020-05-03 DIAGNOSIS — I444 Left anterior fascicular block: Secondary | ICD-10-CM | POA: Insufficient documentation

## 2020-05-03 DIAGNOSIS — I10 Essential (primary) hypertension: Secondary | ICD-10-CM | POA: Insufficient documentation

## 2020-05-03 DIAGNOSIS — Z20822 Contact with and (suspected) exposure to covid-19: Secondary | ICD-10-CM | POA: Insufficient documentation

## 2020-05-03 DIAGNOSIS — Z01818 Encounter for other preprocedural examination: Secondary | ICD-10-CM | POA: Insufficient documentation

## 2020-05-03 DIAGNOSIS — R9431 Abnormal electrocardiogram [ECG] [EKG]: Secondary | ICD-10-CM | POA: Diagnosis not present

## 2020-05-03 LAB — BASIC METABOLIC PANEL
Anion gap: 7 (ref 5–15)
BUN: 28 mg/dL — ABNORMAL HIGH (ref 8–23)
CO2: 29 mmol/L (ref 22–32)
Calcium: 8.9 mg/dL (ref 8.9–10.3)
Chloride: 105 mmol/L (ref 98–111)
Creatinine, Ser: 1.18 mg/dL (ref 0.61–1.24)
GFR calc Af Amer: >60 mL/min (ref >60)
GFR calc non Af Amer: 57 mL/min — ABNORMAL LOW (ref >60)
Glucose, Bld: 108 mg/dL — ABNORMAL HIGH (ref 70–99)
Potassium: 4.7 mmol/L (ref 3.5–5.1)
Sodium: 141 mmol/L (ref 135–145)

## 2020-05-03 LAB — CBC
HCT: 40.5 % (ref 39.0–52.0)
Hemoglobin: 13.5 g/dL (ref 13.0–17.0)
MCH: 32.2 pg (ref 26.0–34.0)
MCHC: 33.3 g/dL (ref 30.0–36.0)
MCV: 96.7 fL (ref 80.0–100.0)
Platelets: 228 10*3/uL (ref 150–400)
RBC: 4.19 MIL/uL — ABNORMAL LOW (ref 4.22–5.81)
RDW: 12.2 % (ref 11.5–15.5)
WBC: 9.6 10*3/uL (ref 4.0–10.5)
nRBC: 0 % (ref 0.0–0.2)

## 2020-05-03 NOTE — Pre-Procedure Instructions (Signed)
Pre-Admit Testing Provider Notification Note  Provider Notified: Dr. Doy Hutching (PCP) & Dr. Dahlia Byes  Notification Mode: Fax  Reason: Request for medical clearance r/t EKG.  Response: Fax confirmation received from both offices.  Additional Information: Called both offices to notify. Placed on chart. Noted on PAT worksheet.  Signed: Beulah Gandy, RN

## 2020-05-03 NOTE — Pre-Procedure Instructions (Signed)
Pre-Admit Testing Provider Notification Note  Provider Notified: Dr. Dahlia Byes  Notification Mode: Call and Fax  Reason: Fax received from Dr. Doy Hutching in response to request for medical clearance. Surgery needs to be rescheduled per Dr. Doy Hutching to allow time for work-up related to request for medical clearance.  Response: Spoke with surgical coordinator at Dr. Corlis Leak office who will call the patient to notify.  Fax confirmation received.  Additional Information:  Signed: Beulah Gandy, RN

## 2020-05-04 LAB — SARS CORONAVIRUS 2 (TAT 6-24 HRS): SARS Coronavirus 2: NEGATIVE

## 2020-05-06 ENCOUNTER — Other Ambulatory Visit: Payer: Self-pay | Admitting: Internal Medicine

## 2020-05-06 DIAGNOSIS — Z125 Encounter for screening for malignant neoplasm of prostate: Secondary | ICD-10-CM | POA: Diagnosis not present

## 2020-05-06 DIAGNOSIS — Z0000 Encounter for general adult medical examination without abnormal findings: Secondary | ICD-10-CM | POA: Diagnosis not present

## 2020-05-06 DIAGNOSIS — Z8546 Personal history of malignant neoplasm of prostate: Secondary | ICD-10-CM | POA: Diagnosis not present

## 2020-05-06 DIAGNOSIS — R7309 Other abnormal glucose: Secondary | ICD-10-CM | POA: Diagnosis not present

## 2020-05-06 DIAGNOSIS — E7800 Pure hypercholesterolemia, unspecified: Secondary | ICD-10-CM | POA: Diagnosis not present

## 2020-05-06 DIAGNOSIS — R911 Solitary pulmonary nodule: Secondary | ICD-10-CM | POA: Diagnosis not present

## 2020-05-06 DIAGNOSIS — E039 Hypothyroidism, unspecified: Secondary | ICD-10-CM | POA: Diagnosis not present

## 2020-05-06 DIAGNOSIS — Z79899 Other long term (current) drug therapy: Secondary | ICD-10-CM | POA: Diagnosis not present

## 2020-05-06 DIAGNOSIS — R9431 Abnormal electrocardiogram [ECG] [EKG]: Secondary | ICD-10-CM | POA: Diagnosis not present

## 2020-05-06 DIAGNOSIS — I10 Essential (primary) hypertension: Secondary | ICD-10-CM | POA: Diagnosis not present

## 2020-05-06 DIAGNOSIS — I2510 Atherosclerotic heart disease of native coronary artery without angina pectoris: Secondary | ICD-10-CM | POA: Diagnosis not present

## 2020-05-07 ENCOUNTER — Ambulatory Visit: Admission: RE | Admit: 2020-05-07 | Payer: PPO | Source: Home / Self Care | Admitting: Surgery

## 2020-05-07 ENCOUNTER — Telehealth: Payer: Self-pay

## 2020-05-07 ENCOUNTER — Encounter: Admission: RE | Payer: Self-pay | Source: Home / Self Care

## 2020-05-07 ENCOUNTER — Telehealth: Payer: Self-pay | Admitting: Cardiovascular Disease

## 2020-05-07 SURGERY — REPAIR, HERNIA, INGUINAL, ROBOT-ASSISTED, LAPAROSCOPIC, USING MESH
Anesthesia: General | Laterality: Left

## 2020-05-07 NOTE — Telephone Encounter (Signed)
Primary Cardiologist:No primary care provider on file.  Chart reviewed as part of pre-operative protocol coverage. Because of Daniel Lewis's past medical history and time since last visit, he/she will require a follow-up visit in order to better assess preoperative cardiovascular risk.  Pre-op covering staff: - Please schedule appointment and call patient to inform them. - Please contact requesting surgeon's office via preferred method (i.e, phone, fax) to inform them of need for appointment prior to surgery.  If applicable, this message will also be routed to pharmacy pool and/or primary cardiologist for input on holding anticoagulant/antiplatelet agent as requested below so that this information is available at time of patient's appointment.   Deberah Pelton, NP  05/07/2020, 3:45 PM

## 2020-05-07 NOTE — Telephone Encounter (Signed)
   [facility] Pre-operative Risk Assessment    HEARTCARE STAFF: - Please ensure there is not already an duplicate clearance open for this procedure. - Under Visit Info/Reason for Call, type in Other and utilize the format Clearance MM/DD/YY or Clearance TBD. Do not use dashes or single digits. - If request is for dental extraction, please clarify the # of teeth to be extracted.  Request for surgical clearance:  1. What type of surgery is being performed? ROBOTIC INGUINAL HERNIA REPAIR WITH MESH  When is this surgery scheduled? TBD  What type of clearance is required (medical clearance vs. Pharmacy clearance to hold med vs. Both)? NOT LISTED  2. Are there any medications that need to be held prior to surgery and how long? NOT LISTED  3. Practice name and name of physician performing surgery? [facility]  4. What is the office phone number? [phone]   7.   What is the office fax number? [phone]  8.   Anesthesia type (None, local, MAC, general) ? NOT LISTED  Daniel Lewis 05/07/2020, 3:13 PM  _________________________________________________________________   (provider comments below)

## 2020-05-07 NOTE — Telephone Encounter (Signed)
Spoke with receptionist @ [place] office patient appointment scheduled 07/15/20.

## 2020-05-07 NOTE — Telephone Encounter (Signed)
Medical clearance faxed to [facility].  Cardiac Clearance faxed to [facility] .  Surgery has been cancelled due to abnormal EKG at pre-admit testing. Patient was seen by Dr.Sparks 05/06/20 and then referred to [facility] Cardiologist.

## 2020-05-07 NOTE — Telephone Encounter (Signed)
Called [facility]/w [facility] she states that they can wait until New pt appt scheduled w/ Dr Rockey Situ 8-23. Will CB if any issues

## 2020-05-08 ENCOUNTER — Telehealth: Payer: Self-pay

## 2020-05-08 NOTE — Telephone Encounter (Signed)
Medical Clearance received from [facility] office- recommend further work up prior to surgery. Patient referred to [facility] 07/15/20-asked if patient could be seen before then and was told that was the first available.

## 2020-05-14 ENCOUNTER — Other Ambulatory Visit: Payer: Self-pay

## 2020-05-14 ENCOUNTER — Ambulatory Visit
Admission: RE | Admit: 2020-05-14 | Discharge: 2020-05-14 | Disposition: A | Discharge status: Home / Self Care | Payer: PPO | Source: Ambulatory Visit | Attending: Internal Medicine | Admitting: Internal Medicine

## 2020-05-14 DIAGNOSIS — R911 Solitary pulmonary nodule: Secondary | ICD-10-CM | POA: Insufficient documentation

## 2020-05-14 DIAGNOSIS — R918 Other nonspecific abnormal finding of lung field: Secondary | ICD-10-CM | POA: Diagnosis not present

## 2020-05-20 DIAGNOSIS — E7800 Pure hypercholesterolemia, unspecified: Secondary | ICD-10-CM | POA: Diagnosis not present

## 2020-05-20 DIAGNOSIS — I2510 Atherosclerotic heart disease of native coronary artery without angina pectoris: Secondary | ICD-10-CM | POA: Diagnosis not present

## 2020-05-20 DIAGNOSIS — I10 Essential (primary) hypertension: Secondary | ICD-10-CM | POA: Diagnosis not present

## 2020-05-20 DIAGNOSIS — Z01810 Encounter for preprocedural cardiovascular examination: Secondary | ICD-10-CM | POA: Insufficient documentation

## 2020-05-28 DIAGNOSIS — H401131 Primary open-angle glaucoma, bilateral, mild stage: Secondary | ICD-10-CM | POA: Diagnosis not present

## 2020-05-29 ENCOUNTER — Telehealth: Payer: Self-pay | Admitting: Surgery

## 2020-05-29 NOTE — Telephone Encounter (Signed)
Spoke with patient and he notified me that he was experiencing some pain from the area where the hernia is located. Patient states when he lies down that the pain/discomfort eases off and then when he is up and walking around. He notices the pain/discomfort the most. Patient was advised to try Tylenol or Ibuprofen along with icing the area with cold compress. Patient states he will try that, but would like to speed up the process of his surgery, but was informed he has to await his Cardiac clearance before moving forward with surgery. Patient states he will try the over the counter medication to help and give our office a call if he doesn't notice any improvement.

## 2020-05-29 NOTE — Telephone Encounter (Signed)
This encounter opened in error/bmv

## 2020-05-29 NOTE — Telephone Encounter (Signed)
Patient calls stating that he has a stress test scheduled for 06/04/20 and heart doctor, Dr. Saralyn Pilar with [facility] on 06/07/20.  Patient is urgently awaiting clearance for surgery.  Patient states that the hernia is puffed, hard and painful and is wondering in the interim if there is anything he could possibly take or do for relief while awaiting clearance? Please advise and call patient.  Thank you.

## 2020-06-04 DIAGNOSIS — Z01810 Encounter for preprocedural cardiovascular examination: Secondary | ICD-10-CM | POA: Diagnosis not present

## 2020-06-04 DIAGNOSIS — I2510 Atherosclerotic heart disease of native coronary artery without angina pectoris: Secondary | ICD-10-CM | POA: Diagnosis not present

## 2020-06-05 ENCOUNTER — Telehealth: Payer: Self-pay | Admitting: Emergency Medicine

## 2020-06-05 NOTE — Telephone Encounter (Signed)
Cardiac Clearance received from Dr Saralyn Pilar. Patient "is at low risk for cardiovascular complications from his upcoming procedure. I would recommend that the patient continue their BETA BLOCKER (if on one) preoperatively including taking it with a small sip of water the morning of surgery. I would also recommend diligent hemodynamic and cardiopulmonary monitoring".

## 2020-06-06 ENCOUNTER — Telehealth: Payer: Self-pay | Admitting: Emergency Medicine

## 2020-06-06 NOTE — Telephone Encounter (Signed)
Per Dr Dahlia Byes, patient is to hold Aspirin 5 days prior to surgery.   Surgery Scheduler Pamala Hurry made aware and is to make patient aware when she calls him to give surgery information.

## 2020-06-07 ENCOUNTER — Telehealth: Payer: Self-pay | Admitting: Surgery

## 2020-06-07 DIAGNOSIS — E7800 Pure hypercholesterolemia, unspecified: Secondary | ICD-10-CM | POA: Diagnosis not present

## 2020-06-07 DIAGNOSIS — Z01810 Encounter for preprocedural cardiovascular examination: Secondary | ICD-10-CM | POA: Diagnosis not present

## 2020-06-07 DIAGNOSIS — I2510 Atherosclerotic heart disease of native coronary artery without angina pectoris: Secondary | ICD-10-CM | POA: Diagnosis not present

## 2020-06-07 DIAGNOSIS — I10 Essential (primary) hypertension: Secondary | ICD-10-CM | POA: Diagnosis not present

## 2020-06-07 NOTE — Telephone Encounter (Signed)
Updated information regarding rescheduled surgery.  Per Almyra Free with [facility] cardiac clearance has been granted for patient to have surgery.      Pt has been advised of Pre-Admission date/time, COVID Testing date and Surgery date.  Surgery Date: 06/18/26 Preadmission Testing Date: 05/01/20 (already done and patient to follow same instructions per pre-admissions) Covid Testing Date: 06/14/20 - patient advised to go to the [facility] ([place]) between 8a-1p  Patient has been made aware to call [phone], between 1-3:00pm the day before surgery, to find out what time to arrive for surgery.    Patient has also been reminded to stop aspirin 5 days before surgery and voices understanding with the above.

## 2020-06-14 ENCOUNTER — Other Ambulatory Visit
Admission: RE | Admit: 2020-06-14 | Discharge: 2020-06-14 | Disposition: A | Discharge status: Home / Self Care | Payer: PPO | Source: Ambulatory Visit | Attending: Surgery | Admitting: Surgery

## 2020-06-14 ENCOUNTER — Telehealth: Payer: Self-pay | Admitting: Surgery

## 2020-06-14 ENCOUNTER — Other Ambulatory Visit: Payer: Self-pay

## 2020-06-14 DIAGNOSIS — Z01812 Encounter for preprocedural laboratory examination: Secondary | ICD-10-CM | POA: Insufficient documentation

## 2020-06-14 DIAGNOSIS — Z20822 Contact with and (suspected) exposure to covid-19: Secondary | ICD-10-CM | POA: Insufficient documentation

## 2020-06-14 NOTE — Telephone Encounter (Signed)
Per Dr.Pabon "I'm on PAL and unable to call him, I am well aware of his previous operations and have had a lot of experience in these kind of situations ( please let him know about this). The robotic approach will allow [place] to be very careful and I do think he will greatly benefit from this technology . I will be happy to talk to him Monday when I come back." Patient states he does not require a call back Monday, as he just wanted Dr.Pabon to be aware of previous surgery/repiars. Patient was advised to give our office a call back if he has any questions or concerns. Patient verbalizes understanding.

## 2020-06-14 NOTE — Telephone Encounter (Signed)
Please call patient.  He has surgery scheduled with Dr. Dahlia Byes on 06/18/20, but now is worried because of some possible scar tissue from hernia surgery about 14 years ago and from his prostate surgery.  Please call him.  Thank you.

## 2020-06-15 LAB — SARS CORONAVIRUS 2 (TAT 6-24 HRS): SARS Coronavirus 2: NEGATIVE

## 2020-06-18 ENCOUNTER — Ambulatory Visit
Admission: RE | Admit: 2020-06-18 | Discharge: 2020-06-18 | Disposition: A | Discharge status: Home / Self Care | Payer: PPO | Attending: Surgery | Admitting: Surgery

## 2020-06-18 ENCOUNTER — Encounter: Payer: Self-pay | Admitting: Surgery

## 2020-06-18 ENCOUNTER — Ambulatory Visit: Payer: PPO | Admitting: Anesthesiology

## 2020-06-18 ENCOUNTER — Other Ambulatory Visit: Payer: Self-pay

## 2020-06-18 ENCOUNTER — Encounter
Admission: RE | Disposition: A | Discharge status: Home / Self Care | Payer: Self-pay | Source: Home / Self Care | Attending: Surgery

## 2020-06-18 DIAGNOSIS — I129 Hypertensive chronic kidney disease with stage 1 through stage 4 chronic kidney disease, or unspecified chronic kidney disease: Secondary | ICD-10-CM | POA: Diagnosis not present

## 2020-06-18 DIAGNOSIS — Z8546 Personal history of malignant neoplasm of prostate: Secondary | ICD-10-CM | POA: Diagnosis not present

## 2020-06-18 DIAGNOSIS — N189 Chronic kidney disease, unspecified: Secondary | ICD-10-CM | POA: Diagnosis not present

## 2020-06-18 DIAGNOSIS — I252 Old myocardial infarction: Secondary | ICD-10-CM | POA: Insufficient documentation

## 2020-06-18 DIAGNOSIS — I2510 Atherosclerotic heart disease of native coronary artery without angina pectoris: Secondary | ICD-10-CM | POA: Diagnosis not present

## 2020-06-18 DIAGNOSIS — K4090 Unilateral inguinal hernia, without obstruction or gangrene, not specified as recurrent: Secondary | ICD-10-CM | POA: Diagnosis not present

## 2020-06-18 DIAGNOSIS — K4091 Unilateral inguinal hernia, without obstruction or gangrene, recurrent: Secondary | ICD-10-CM | POA: Diagnosis not present

## 2020-06-18 HISTORY — PX: XI ROBOTIC ASSISTED INGUINAL HERNIA REPAIR WITH MESH: SHX6706

## 2020-06-18 SURGERY — REPAIR, HERNIA, INGUINAL, ROBOT-ASSISTED, LAPAROSCOPIC, USING MESH
Anesthesia: General | Laterality: Left

## 2020-06-18 MED ORDER — ROCURONIUM BROMIDE 10 MG/ML (PF) SYRINGE
PREFILLED_SYRINGE | INTRAVENOUS | Status: AC
  Filled 2020-06-18: qty 10

## 2020-06-18 MED ORDER — BUPIVACAINE-EPINEPHRINE 0.25% -1:200000 IJ SOLN
INTRAMUSCULAR | Status: DC | PRN
  Administered 2020-06-18: 30 mL

## 2020-06-18 MED ORDER — PROPOFOL 10 MG/ML IV BOLUS
INTRAVENOUS | Status: DC | PRN
  Administered 2020-06-18: 120 mg via INTRAVENOUS

## 2020-06-18 MED ORDER — FENTANYL CITRATE (PF) 100 MCG/2ML IJ SOLN
25.0000 ug | INTRAMUSCULAR | Status: DC | PRN
  Administered 2020-06-18: 25 ug via INTRAVENOUS

## 2020-06-18 MED ORDER — FENTANYL CITRATE (PF) 100 MCG/2ML IJ SOLN
INTRAMUSCULAR | Status: AC
  Filled 2020-06-18: qty 2

## 2020-06-18 MED ORDER — CHLORHEXIDINE GLUCONATE CLOTH 2 % EX PADS: 6.0000 | MEDICATED_PAD | Freq: Once | CUTANEOUS | Status: DC

## 2020-06-18 MED ORDER — SUGAMMADEX SODIUM 200 MG/2ML IV SOLN
INTRAVENOUS | Status: DC | PRN
  Administered 2020-06-18: 200 mg via INTRAVENOUS

## 2020-06-18 MED ORDER — PHENYLEPHRINE HCL (PRESSORS) 10 MG/ML IV SOLN
INTRAVENOUS | Status: DC | PRN
  Administered 2020-06-18: 100 ug via INTRAVENOUS

## 2020-06-18 MED ORDER — CHLORHEXIDINE GLUCONATE 0.12 % MT SOLN: 15.0000 mL | Freq: Once | OROMUCOSAL | Status: AC

## 2020-06-18 MED ORDER — HYDROCODONE-ACETAMINOPHEN 5-325 MG PO TABS: 1.0000 | ORAL_TABLET | Freq: Four times a day (QID) | ORAL | 0 refills | Status: DC | PRN

## 2020-06-18 MED ORDER — BUPIVACAINE-EPINEPHRINE (PF) 0.25% -1:200000 IJ SOLN
INTRAMUSCULAR | Status: AC
  Filled 2020-06-18: qty 30

## 2020-06-18 MED ORDER — LIDOCAINE HCL (PF) 2 % IJ SOLN
INTRAMUSCULAR | Status: AC
  Filled 2020-06-18: qty 5

## 2020-06-18 MED ORDER — HYDROCODONE-ACETAMINOPHEN 5-325 MG PO TABS
1.0000 | ORAL_TABLET | Freq: Once | ORAL | Status: AC
  Administered 2020-06-18: 1 via ORAL

## 2020-06-18 MED ORDER — LIDOCAINE HCL (CARDIAC) PF 100 MG/5ML IV SOSY
PREFILLED_SYRINGE | INTRAVENOUS | Status: DC | PRN
  Administered 2020-06-18: 60 mg via INTRAVENOUS

## 2020-06-18 MED ORDER — GABAPENTIN 300 MG PO CAPS
300.0000 mg | ORAL_CAPSULE | ORAL | Status: AC
  Administered 2020-06-18: 300 mg via ORAL

## 2020-06-18 MED ORDER — CHLORHEXIDINE GLUCONATE 0.12 % MT SOLN
OROMUCOSAL | Status: AC
  Administered 2020-06-18: 15 mL via OROMUCOSAL
  Filled 2020-06-18: qty 15

## 2020-06-18 MED ORDER — ONDANSETRON HCL 4 MG/2ML IJ SOLN
INTRAMUSCULAR | Status: DC | PRN
  Administered 2020-06-18: 4 mg via INTRAVENOUS

## 2020-06-18 MED ORDER — GLYCOPYRROLATE 0.2 MG/ML IJ SOLN
INTRAMUSCULAR | Status: DC | PRN
  Administered 2020-06-18 (×2): .2 mg via INTRAVENOUS

## 2020-06-18 MED ORDER — FENTANYL CITRATE (PF) 100 MCG/2ML IJ SOLN
INTRAMUSCULAR | Status: DC | PRN
  Administered 2020-06-18: 20 ug via INTRAVENOUS
  Administered 2020-06-18: 25 ug via INTRAVENOUS
  Administered 2020-06-18: 5 ug via INTRAVENOUS

## 2020-06-18 MED ORDER — ACETAMINOPHEN 500 MG PO TABS
ORAL_TABLET | ORAL | Status: AC
  Administered 2020-06-18: 1000 mg via ORAL
  Filled 2020-06-18: qty 2

## 2020-06-18 MED ORDER — GABAPENTIN 300 MG PO CAPS
ORAL_CAPSULE | ORAL | Status: AC
  Filled 2020-06-18: qty 1

## 2020-06-18 MED ORDER — EPHEDRINE SULFATE 50 MG/ML IJ SOLN
INTRAMUSCULAR | Status: DC | PRN
  Administered 2020-06-18 (×2): 10 mg via INTRAVENOUS

## 2020-06-18 MED ORDER — ORAL CARE MOUTH RINSE: 15.0000 mL | Freq: Once | OROMUCOSAL | Status: AC

## 2020-06-18 MED ORDER — LACTATED RINGERS IV SOLN
INTRAVENOUS | Status: DC
  Administered 2020-06-18: 10 mL/h via INTRAVENOUS

## 2020-06-18 MED ORDER — VASOPRESSIN 20 UNIT/ML IV SOLN
INTRAVENOUS | Status: DC | PRN
  Administered 2020-06-18: 2 [IU] via INTRAVENOUS

## 2020-06-18 MED ORDER — ROCURONIUM BROMIDE 100 MG/10ML IV SOLN
INTRAVENOUS | Status: DC | PRN
  Administered 2020-06-18 (×2): 20 mg via INTRAVENOUS
  Administered 2020-06-18: 40 mg via INTRAVENOUS
  Administered 2020-06-18: 10 mg via INTRAVENOUS

## 2020-06-18 MED ORDER — PROPOFOL 10 MG/ML IV BOLUS
INTRAVENOUS | Status: AC
  Filled 2020-06-18: qty 20

## 2020-06-18 MED ORDER — CEFAZOLIN SODIUM-DEXTROSE 2-4 GM/100ML-% IV SOLN
INTRAVENOUS | Status: AC
  Filled 2020-06-18: qty 100

## 2020-06-18 MED ORDER — HYDROCODONE-ACETAMINOPHEN 5-325 MG PO TABS
ORAL_TABLET | ORAL | Status: AC
  Filled 2020-06-18: qty 1

## 2020-06-18 MED ORDER — CHLORHEXIDINE GLUCONATE CLOTH 2 % EX PADS
6.0000 | MEDICATED_PAD | Freq: Once | CUTANEOUS | Status: AC
  Administered 2020-06-18: 6 via TOPICAL

## 2020-06-18 MED ORDER — CELECOXIB 200 MG PO CAPS
ORAL_CAPSULE | ORAL | Status: AC
  Administered 2020-06-18: 200 mg via ORAL
  Filled 2020-06-18: qty 1

## 2020-06-18 MED ORDER — ONDANSETRON HCL 4 MG/2ML IJ SOLN: 4.0000 mg | Freq: Once | INTRAMUSCULAR | Status: DC | PRN

## 2020-06-18 MED ORDER — ACETAMINOPHEN 500 MG PO TABS: 1000.0000 mg | ORAL_TABLET | ORAL | Status: AC

## 2020-06-18 MED ORDER — CEFAZOLIN SODIUM-DEXTROSE 2-4 GM/100ML-% IV SOLN
2.0000 g | INTRAVENOUS | Status: AC
  Administered 2020-06-18: 2 g via INTRAVENOUS

## 2020-06-18 MED ORDER — CELECOXIB 200 MG PO CAPS: 200.0000 mg | ORAL_CAPSULE | ORAL | Status: AC

## 2020-06-18 SURGICAL SUPPLY — 54 items
ADH SKN CLS APL DERMABOND .7 (GAUZE/BANDAGES/DRESSINGS) ×1
APL PRP STRL LF DISP 70% ISPRP (MISCELLANEOUS) ×1
CANISTER SUCT 1200ML W/VALVE (MISCELLANEOUS) ×3 IMPLANT
CANNULA REDUC XI 12-8 STAPL (CANNULA) ×1
CANNULA REDUC XI 12-8MM STAPL (CANNULA) ×1
CANNULA REDUCER 12-8 DVNC XI (CANNULA) ×1 IMPLANT
CHLORAPREP W/TINT 26 (MISCELLANEOUS) ×3 IMPLANT
COVER TIP SHEARS 8 DVNC (MISCELLANEOUS) ×1 IMPLANT
COVER TIP SHEARS 8MM DA VINCI (MISCELLANEOUS) ×2
COVER WAND RF STERILE (DRAPES) ×3 IMPLANT
DEFOGGER SCOPE WARMER CLEARIFY (MISCELLANEOUS) ×3 IMPLANT
DERMABOND ADVANCED (GAUZE/BANDAGES/DRESSINGS) ×2
DERMABOND ADVANCED .7 DNX12 (GAUZE/BANDAGES/DRESSINGS) ×1 IMPLANT
DRAPE 3/4 80X56 (DRAPES) IMPLANT
DRAPE ARM DVNC X/XI (DISPOSABLE) ×3 IMPLANT
DRAPE COLUMN DVNC XI (DISPOSABLE) ×1 IMPLANT
DRAPE DA VINCI XI ARM (DISPOSABLE) ×6
DRAPE DA VINCI XI COLUMN (DISPOSABLE) ×2
ELECT CAUTERY BLADE 6.4 (BLADE) ×3 IMPLANT
ELECT REM PT RETURN 9FT ADLT (ELECTROSURGICAL) ×3
ELECTRODE REM PT RTRN 9FT ADLT (ELECTROSURGICAL) ×1 IMPLANT
GLOVE BIO SURGEON STRL SZ7 (GLOVE) ×12 IMPLANT
GOWN STRL REUS W/ TWL LRG LVL3 (GOWN DISPOSABLE) ×4 IMPLANT
GOWN STRL REUS W/TWL LRG LVL3 (GOWN DISPOSABLE) ×12
IRRIGATION STRYKERFLOW (MISCELLANEOUS) IMPLANT
IRRIGATOR STRYKERFLOW (MISCELLANEOUS)
IV NS 1000ML (IV SOLUTION)
IV NS 1000ML BAXH (IV SOLUTION) IMPLANT
KIT PINK PAD W/HEAD ARE REST (MISCELLANEOUS) ×3
KIT PINK PAD W/HEAD ARM REST (MISCELLANEOUS) ×1 IMPLANT
LABEL OR SOLS (LABEL) ×3 IMPLANT
MESH 3DMAX 4X6 LT LRG (Mesh General) ×3 IMPLANT
NEEDLE HYPO 22GX1.5 SAFETY (NEEDLE) ×3 IMPLANT
OBTURATOR OPTICAL STANDARD 8MM (TROCAR) ×2
OBTURATOR OPTICAL STND 8 DVNC (TROCAR) ×1
OBTURATOR OPTICALSTD 8 DVNC (TROCAR) ×1 IMPLANT
PACK LAP CHOLECYSTECTOMY (MISCELLANEOUS) ×3 IMPLANT
PENCIL ELECTRO HAND CTR (MISCELLANEOUS) ×3 IMPLANT
SEAL CANN UNIV 5-8 DVNC XI (MISCELLANEOUS) ×3 IMPLANT
SEAL XI 5MM-8MM UNIVERSAL (MISCELLANEOUS) ×6
SET TUBE SMOKE EVAC HIGH FLOW (TUBING) ×3 IMPLANT
SOLUTION ELECTROLUBE (MISCELLANEOUS) ×3 IMPLANT
SPONGE LAP 18X18 RF (DISPOSABLE) ×3 IMPLANT
STAPLER CANNULA SEAL DVNC XI (STAPLE) ×1 IMPLANT
STAPLER CANNULA SEAL XI (STAPLE) ×2
SUT MNCRL AB 4-0 PS2 18 (SUTURE) ×3 IMPLANT
SUT V-LOC 90 ABS 3-0 VLT  V-20 (SUTURE) ×2
SUT V-LOC 90 ABS 3-0 VLT V-20 (SUTURE) ×1 IMPLANT
SUT VIC AB 2-0 SH 27 (SUTURE) ×3
SUT VIC AB 2-0 SH 27XBRD (SUTURE) ×1 IMPLANT
SUT VICRYL 0 AB UR-6 (SUTURE) ×3 IMPLANT
SUT VLOC 90 S/L VL9 GS22 (SUTURE) ×6 IMPLANT
TAPE TRANSPORE STRL 2 31045 (GAUZE/BANDAGES/DRESSINGS) ×3 IMPLANT
TROCAR BALLN GELPORT 12X130M (ENDOMECHANICALS) ×3 IMPLANT

## 2020-06-18 NOTE — Anesthesia Preprocedure Evaluation (Signed)
Anesthesia Evaluation  Patient identified by MRN, date of birth, ID band Patient awake    Reviewed: Allergy & Precautions, H&P , NPO status , Patient's Chart, lab work & pertinent test results  History of Anesthesia Complications Negative for: history of anesthetic complications  Airway Mallampati: III  TM Distance: <3 FB     Dental  (+) Chipped, Missing, Dental Advidsory Given   Pulmonary neg pulmonary ROS,           Cardiovascular Exercise Tolerance: Good hypertension, (-) angina+ CAD and + Past MI (20+ years ago)  (-) dysrhythmias (-) Valvular Problems/Murmurs     Neuro/Psych negative neurological ROS  negative psych ROS   GI/Hepatic Neg liver ROS, GERD  ,  Endo/Other  Hypothyroidism   Renal/GU ARFRenal disease     Musculoskeletal   Abdominal   Peds  Hematology  (+) Blood dyscrasia, anemia ,   Anesthesia Other Findings Past Medical History: No date: Anemia No date: ASHD (arteriosclerotic heart disease) No date: Chronic kidney disease No date: Erectile dysfunction No date: Hyperlipidemia No date: Hypertension No date: Hypothyroidism No date: MI (myocardial infarction) ([place]) No date: Prostate cancer ([place]) No date: Vertigo  Past Surgical History: No date: CHOLECYSTECTOMY No date: COLONOSCOPY 12/16/2015: COLONOSCOPY WITH PROPOFOL; N/A     Comment:  Procedure: COLONOSCOPY WITH PROPOFOL;  Surgeon: Manya Silvas, MD;  Location: [facility] ENDOSCOPY;  Service:               Endoscopy;  Laterality: N/A; No date: INGUINAL HERNIA REPAIR No date: PROSTATECTOMY  BMI    Body Mass Index:  19.46 kg/m      Reproductive/Obstetrics negative OB ROS                             Anesthesia Physical  Anesthesia Plan  ASA: III  Anesthesia Plan: General   Post-op Pain Management:    Induction: Intravenous  PONV Risk Score and Plan: Ondansetron, Dexamethasone, Midazolam and  Treatment may vary due to age or medical condition  Airway Management Planned: Oral ETT  Additional Equipment:   Intra-op Plan:   Post-operative Plan: Extubation in OR  Informed Consent: I have reviewed the patients History and Physical, chart, labs and discussed the procedure including the risks, benefits and alternatives for the proposed anesthesia with the patient or authorized representative who has indicated his/her understanding and acceptance.     Dental Advisory Given  Plan Discussed with: Anesthesiologist, CRNA and Surgeon  Anesthesia Plan Comments:         Anesthesia Quick Evaluation

## 2020-06-18 NOTE — Op Note (Signed)
  Robotic assistedLaparoscopicTransabdominalLeft Inguinal Hernia Repair with 3 D large Mesh    Pre-operative Diagnosis: Recurrent left Inguinal Hernia  Post-operative Diagnosis: Same  Procedure:RoboticLaparoscopic repair of left inguinal hernias  Surgeon:Usama Harkless, MD FACS  Anesthesia:Gen. with endotracheal tube  Findings:Left inguinal hernia indirect recurrent with significant inflammatory response due to prior repair.  Recurrence on the lateral side  Procedure Details The patient was seen again in the Holding Room. The benefits, complications, treatment options, and expected outcomes were discussed with the patient. The risks of bleeding, infection, recurrence of symptoms, failure to resolve symptoms, recurrence of hernia, ischemic orchitis, chronic pain syndrome or neuroma, were discussed again. The likelihood of improving the patient's symptoms with return to their baseline status is good. The patient and/or family concurred with the proposed plan, giving informed consent. The patient was taken to Operating Room, identified and the procedure verified as Laparoscopic Inguinal Hernia Repair. Laterality confirmed. A Time Out was held and the above information confirmed.  Prior to the induction of general anesthesia, antibiotic prophylaxis was administered. VTE prophylaxis was in place. General endotracheal anesthesia was then administered and tolerated well. After the induction, the abdomen was prepped with Chloraprep and draped in the sterile fashion. The patient was positioned in the supine position.  Supraumbilical incision created and cut down technique used to enter the abdominal cavity. Fascia elevated and incised and hasson trochar placed. Pneumoperitoneum obtained w/o HD changes. No evidence of bowel injuries.  Two 8 mm placed under direct vision. The laparoscopy revealed large indirect defects. I inserted the needles and the mesh. The robot was brought  ot the table and docked in the standard fashion, no collision between arms was observed. Instruments were kept under direct view at all times. We started on the Left side where a flap was created. The sac was reduced and dissected free from adjacent structures.   The sigmoid was within the hernia sac, meticulous dissection allowed [place] to get the sigmoid off the peritoneum.  We preserved the vas and the vessels. Once dissection was completed a large 3D mesh was placed and secured with two interrupted vicryl attached to the pubic tubercle. There was good coverage of the direct, indirect and femoral spaces. The flap was closed with v lock suture. We encountered a few holes within the peritoneum that we were able to close with the redundant sac.  Second look revealed no complications or injuries.   Once assuring that hemostasis was adequate the ports were removed and a figure-of-eight 0 Vicryl suture was placed at the fascial edges. 4-0 subcuticular Monocryl was used at all skin edges.Dermabond was placed. Patient tolerated the procedure well. There were no complications. He was taken to the recovery room in stable condition.       Caroleen Hamman, MD, FACS

## 2020-06-18 NOTE — Transfer of Care (Signed)
Immediate Anesthesia Transfer of Care Note  Patient: Daniel Lewis  Procedure(s) Performed: XI ROBOTIC ASSISTED INGUINAL HERNIA REPAIR WITH MESH, possible bilateral (Left )  Patient Location: PACU  Anesthesia Type:General  Level of Consciousness: sedated  Airway & Oxygen Therapy: Patient Spontanous Breathing and Patient connected to face mask oxygen  Post-op Assessment: Report given to RN, Post -op Vital signs reviewed and stable and Post -op Vital signs reviewed and unstable, Anesthesiologist notified  Post vital signs: Reviewed and stable  Last Vitals:  Vitals Value Taken Time  BP 128/78 06/18/20 1111  Temp 36.4 C 06/18/20 1111  Pulse 50 06/18/20 1118  Resp 15 06/18/20 1118  SpO2 100 % 06/18/20 1118  Vitals shown include unvalidated device data.  Last Pain:  Vitals:   06/18/20 1111  TempSrc:   PainSc: Asleep         Complications: No complications documented.

## 2020-06-18 NOTE — Anesthesia Procedure Notes (Signed)
Procedure Name: Intubation Date/Time: 06/18/2020 7:45 AM Performed by: Allean Found, CRNA Pre-anesthesia Checklist: Patient identified, Patient being monitored, Timeout performed, Emergency Drugs available and Suction available Patient Re-evaluated:Patient Re-evaluated prior to induction Oxygen Delivery Method: Circle system utilized Preoxygenation: Pre-oxygenation with 100% oxygen Induction Type: IV induction Ventilation: Mask ventilation without difficulty Laryngoscope Size: McGraph and 4 Grade View: Grade I Tube type: Oral Tube size: 7.5 mm Number of attempts: 1 Airway Equipment and Method: Stylet Placement Confirmation: ETT inserted through vocal cords under direct vision,  positive ETCO2 and breath sounds checked- equal and bilateral Secured at: 22 cm Dental Injury: Teeth and Oropharynx as per pre-operative assessment

## 2020-06-18 NOTE — OR Nursing (Signed)
Per Dr. Dahlia Byes, verbal, pt may resume aspirin tomorrow 06/19/20.  Added to d/c instruction sheet, med. section

## 2020-06-18 NOTE — Discharge Instructions (Signed)

## 2020-06-18 NOTE — H&P (Signed)
Chief Complaint  Patient presents with  . Follow-up    Left inguinal hernia     HPI: Daniel Lewis is an 85 year old male well-known to me with a prior history of laparotomy for small bowel obstruction last year on January 2020.  He has recovered very well from this comes in with intermittent left inguinal pain.  The pain is moderate intensity sharp and worsening with certain movements and with Valsalva.  No fevers no chills.  He did have a CT scan that have personally reviewed confirming the left inguinal hernia with some sigmoid colon next hernia sac.  No evidence of incarceration or strangulation.  He is otherwise relatively healthy and very functional for and obtained area.  He is able to perform more than 4 METS of activity without any shortness of breath or chest pain He already had a previous bilateral inguinal hernia repair by Dr. Rochel Brome several years ago.      Past Medical History:  Diagnosis Date  . Anemia   . ASHD (arteriosclerotic heart disease)   . Chronic kidney disease   . Erectile dysfunction   . Hyperlipidemia   . Hypertension   . Hypothyroidism   . MI (myocardial infarction) ([place])   . Prostate cancer ([place])   . Vertigo          Past Surgical History:  Procedure Laterality Date  . CHOLECYSTECTOMY    . COLONOSCOPY    . COLONOSCOPY WITH PROPOFOL N/A 12/16/2015   Procedure: COLONOSCOPY WITH PROPOFOL;  Surgeon: Manya Silvas, MD;  [facility] END[facility]COPY;  Service: Endoscopy;  Laterality: N/A;  . INGUINAL HERNIA REPAIR    . LAPAROTOMY N/A 12/02/2018   Procedure: EXPLORATORY LAPAROTOMY, LYSIS OF ADHESIONS, POSSIBLE SBO;  Surgeon: Jules Husbands, MD;  Location: [facility] ORS;  Service: General;  Laterality: N/A;  . PROSTATECTOMY      History reviewed. No pertinent family history.  Social History:  reports that he has never smoked. He has never used smokeless tobacco. He reports that he does not drink alcohol or use drugs.  Allergies:       Allergies  Allergen Reactions  . Other     Medications reviewed.    ROS Full ROS performed and is otherwise negative other than what is stated in HPI   BP (!) 158/68   Pulse (!) 54   Temp 97.7 F (36.5 C)   Resp 12   Ht 5\' 8"  (1.727 m)   Wt 145 lb 6.4 oz (66 kg)   SpO2 97%   BMI 22.11 kg/m   Physical Exam Vitals and nursing note reviewed. Exam conducted with a chaperone present.  Constitutional:      General: He is not in acute distress.    Appearance: Normal appearance. He is normal weight.  Eyes:     General: No scleral icterus.       Right eye: No discharge.        Left eye: No discharge.  Neck:     Vascular: No carotid bruit.  Cardiovascular:     Rate and Rhythm: Normal rate and regular rhythm.  Pulmonary:     Effort: Pulmonary effort is normal. No respiratory distress.     Breath sounds: Normal breath sounds. No stridor.  Abdominal:     General: Abdomen is flat. There is no distension.     Palpations: Abdomen is soft. There is no mass.     Tenderness: There is no abdominal tenderness. There is no right CVA tenderness, left  CVA tenderness, guarding or rebound.     Hernia: A hernia is present.     Comments: Left reducible inguinal hernia mildly tender to palpation.  No peritonitis  Musculoskeletal:        General: Normal range of motion.     Cervical back: Normal range of motion and neck supple. No rigidity or tenderness.  Skin:    General: Skin is warm and dry.     Capillary Refill: Capillary refill takes less than 2 seconds.  Neurological:     General: No focal deficit present.     Mental Status: He is alert and oriented to person, place, and time.  Psychiatric:        Mood and Affect: Mood normal.        Behavior: Behavior normal.        Thought Content: Thought content normal.        Judgment: Judgment normal.         Assessment/Plan: Symptomatic recurrent left inguinal hernia.  Discussed with the patient detail about  her disease process and I definitely recommend repair of his hernia.  I do think that he is a good candidate for robotic approach.  Procedure discussed with the patient in detail.  Risks, benefits and possible indications including but not limited to, bleeding, infection, chronic pain, recurrence potential bowel injury.  He understands and wished to proceed. We also discussed the chance that if we find bilateral disease that we will go ahead and fix it during the same operative setting.   Caroleen Hamman, MD [facility] General Surgeon

## 2020-06-19 ENCOUNTER — Encounter: Payer: Self-pay | Admitting: Surgery

## 2020-06-20 ENCOUNTER — Encounter: Payer: Self-pay | Admitting: Surgery

## 2020-06-20 NOTE — Addendum Note (Signed)
Addendum  created 06/20/20 1454 by Allean Found, CRNA   Intraprocedure Event edited

## 2020-06-20 NOTE — Anesthesia Postprocedure Evaluation (Signed)
Anesthesia Post Note  Patient: Daniel Lewis  Procedure(s) Performed: XI ROBOTIC ASSISTED INGUINAL HERNIA REPAIR WITH MESH, possible bilateral (Left )  Patient location during evaluation: PACU Anesthesia Type: General Level of consciousness: awake and alert Pain management: pain level controlled Vital Signs Assessment: post-procedure vital signs reviewed and stable Respiratory status: spontaneous breathing, nonlabored ventilation, respiratory function stable and patient connected to nasal cannula oxygen Cardiovascular status: blood pressure returned to baseline and stable Postop Assessment: no apparent nausea or vomiting Anesthetic complications: no   No complications documented.   Last Vitals:  Vitals:   06/18/20 1203 06/18/20 1232  BP: (!) 142/47 (!) 126/54  Pulse: 52 51  Resp: 16 16  Temp: (!) 36.1 C   SpO2: 99% 100%    Last Pain:  Vitals:   06/19/20 0806  TempSrc:   PainSc: [place]

## 2020-07-02 ENCOUNTER — Other Ambulatory Visit: Payer: Self-pay

## 2020-07-02 ENCOUNTER — Ambulatory Visit (INDEPENDENT_AMBULATORY_CARE_PROVIDER_SITE_OTHER): Payer: Self-pay | Admitting: Physician Assistant

## 2020-07-02 ENCOUNTER — Encounter: Payer: Self-pay | Admitting: Physician Assistant

## 2020-07-02 VITALS — BP 164/58 | HR 60 | Temp 98.0°F | Resp 12 | Wt 143.0 lb

## 2020-07-02 DIAGNOSIS — Z09 Encounter for follow-up examination after completed treatment for conditions other than malignant neoplasm: Secondary | ICD-10-CM

## 2020-07-02 DIAGNOSIS — K4091 Unilateral inguinal hernia, without obstruction or gangrene, recurrent: Secondary | ICD-10-CM

## 2020-07-02 NOTE — Progress Notes (Signed)
[facility] POST-OP OFFICE VISIT  07/02/2020  HPI: Daniel Lewis is a 85 y.o. male 14 days s/p robotic assisted laparoscopic left inguinal hernia repair with Dr Dahlia Byes  He is doing good, he reported soreness for the first week or so but this rapidly improved.  He is only taking tylenol intermittently for pain No fever, chills, nausea, emesis, or bowel changes Tolerating PO Mobilizing without issues  Vital signs: BP (!) 164/58   Pulse 60   Temp 98 F (36.7 C)   Resp 12   Wt 143 lb (64.9 kg)   SpO2 95%   BMI 21.74 kg/m    Physical Exam: Constitutional: Well appearing male, NAD Abdomen: Soft, non-tender, non-distended, no rebound or guarding Skin: Laparoscopic incisions are well healed, no erythema or drainage   Assessment/Plan: This is a 85 y.o. male 14 days s/p robotic assisted laparoscopic left inguinal hernia repair   - pain control prn  - reviewed lifting restrictions; 2-4 more weeks  - rtc prn; advised to call with questions/concerns  -- Edison Simon, PA-C [facility] 07/02/2020, 10:08 AM [phone] M-F: 7am - 4pm

## 2020-07-02 NOTE — Patient Instructions (Addendum)
Follow up as needed, call the office if you have any questions or concerns.   GENERAL POST-OPERATIVE PATIENT INSTRUCTIONS   WOUND CARE INSTRUCTIONS:  Keep a dry clean dressing on the wound if there is drainage. The initial bandage may be removed after 24 hours.  Once the wound has quit draining you may leave it open to air.  If clothing rubs against the wound or causes irritation and the wound is not draining you may cover it with a dry dressing during the daytime.  Try to keep the wound dry and avoid ointments on the wound unless directed to do so.  If the wound becomes bright red and painful or starts to drain infected material that is not clear, please contact your physician immediately.  If the wound is mildly pink and has a thick firm ridge underneath it, this is normal, and is referred to as a healing ridge.  This will resolve over the next 4-6 weeks.  BATHING: You may shower if you have been informed of this by your surgeon. However, Please do not submerge in a tub, hot tub, or pool until incisions are completely sealed or have been told by your surgeon that you may do so.  DIET:  You may eat any foods that you can tolerate.  It is a good idea to eat a high fiber diet and take in plenty of fluids to prevent constipation.  If you do become constipated you may want to take a mild laxative or take ducolax tablets on a daily basis until your bowel habits are regular.  Constipation can be very uncomfortable, along with straining, after recent surgery.  ACTIVITY:  You are encouraged to cough and deep breath or use your incentive spirometer if you were given one, every 15-30 minutes when awake.  This will help prevent respiratory complications and low grade fevers post-operatively if you had a general anesthetic.  You may want to hug a pillow when coughing and sneezing to add additional support to the surgical area, if you had abdominal or chest surgery, which will decrease pain during these times.  You  are encouraged to walk and engage in light activity for the next two weeks.  You should not lift more than 15-20 pounds 4 to 6 weeks after surgery as it could put you at increased risk for complications.  Twenty pounds is roughly equivalent to a plastic bag of groceries. At that time- Listen to your body when lifting, if you have pain when lifting, stop and then try again in a few days. Soreness after doing exercises or activities of daily living is normal as you get back in to your normal routine.  MEDICATIONS:  Try to take narcotic medications and anti-inflammatory medications, such as tylenol, ibuprofen, naprosyn, etc., with food.  This will minimize stomach upset from the medication.  Should you develop nausea and vomiting from the pain medication, or develop a rash, please discontinue the medication and contact your physician.  You should not drive, make important decisions, or operate machinery when taking narcotic pain medication.  SUNBLOCK Use sun block to incision area over the next year if this area will be exposed to sun. This helps decrease scarring and will allow you avoid a permanent darkened area over your incision.  QUESTIONS:  Please feel free to call our office if you have any questions, and we will be glad to assist you.

## 2020-07-03 DIAGNOSIS — H903 Sensorineural hearing loss, bilateral: Secondary | ICD-10-CM | POA: Diagnosis not present

## 2020-07-03 DIAGNOSIS — H6123 Impacted cerumen, bilateral: Secondary | ICD-10-CM | POA: Diagnosis not present

## 2020-07-15 ENCOUNTER — Ambulatory Visit: Payer: PPO | Admitting: Cardiovascular Disease

## 2020-07-31 ENCOUNTER — Encounter: Payer: PPO | Admitting: Dermatology

## 2020-07-31 ENCOUNTER — Encounter: Payer: Self-pay | Admitting: Dermatology

## 2020-07-31 ENCOUNTER — Ambulatory Visit: Payer: PPO | Admitting: Dermatology

## 2020-07-31 ENCOUNTER — Other Ambulatory Visit: Payer: Self-pay

## 2020-07-31 DIAGNOSIS — D229 Melanocytic nevi, unspecified: Secondary | ICD-10-CM | POA: Diagnosis not present

## 2020-07-31 DIAGNOSIS — L578 Other skin changes due to chronic exposure to nonionizing radiation: Secondary | ICD-10-CM

## 2020-07-31 DIAGNOSIS — D1800 Hemangioma unspecified site: Secondary | ICD-10-CM | POA: Diagnosis not present

## 2020-07-31 DIAGNOSIS — L814 Other melanin hyperpigmentation: Secondary | ICD-10-CM | POA: Diagnosis not present

## 2020-07-31 DIAGNOSIS — L570 Actinic keratosis: Secondary | ICD-10-CM | POA: Diagnosis not present

## 2020-07-31 DIAGNOSIS — L821 Other seborrheic keratosis: Secondary | ICD-10-CM

## 2020-07-31 DIAGNOSIS — Z1283 Encounter for screening for malignant neoplasm of skin: Secondary | ICD-10-CM | POA: Diagnosis not present

## 2020-07-31 DIAGNOSIS — L820 Inflamed seborrheic keratosis: Secondary | ICD-10-CM

## 2020-07-31 DIAGNOSIS — D692 Other nonthrombocytopenic purpura: Secondary | ICD-10-CM

## 2020-07-31 NOTE — Progress Notes (Signed)
Follow-Up Visit   Subjective  Daniel Lewis is a 85 y.o. male who presents for the following: Annual Exam (Total body skin exam, hx of AKs) and growth (L axilla, ~64m, no symptoms). The patient presents for Total-Body Skin Exam (TBSE) for skin cancer screening and mole check.  The following portions of the chart were reviewed this encounter and updated as appropriate:  Tobacco   Allergies   Meds   Problems   Med Hx   Surg Hx   Fam Hx      Review of Systems:  No other skin or systemic complaints except as noted in HPI or Assessment and Plan.  Objective  Well appearing patient in no apparent distress; mood and affect are within normal limits.  A full examination was performed including scalp, head, eyes, ears, nose, lips, neck, chest, axillae, abdomen, back, buttocks, bilateral upper extremities, bilateral lower extremities, hands, feet, fingers, toes, fingernails, and toenails. All findings within normal limits unless otherwise noted below.  Objective  face x 20 (20): Pink scaly macules    Objective  face x 4 (4): Erythematous keratotic or waxy stuck-on papule or plaque.    Assessment & Plan    Lentigines - Scattered tan macules - Discussed due to sun exposure - Benign, observe - Call for any changes  Seborrheic Keratoses - Stuck-on, waxy, tan-brown papules and plaques  - Discussed benign etiology and prognosis. - Observe - Call for any changes  Melanocytic Nevi - Tan-brown and/or pink-flesh-colored symmetric macules and papules - Benign appearing on exam today - Observation - Call clinic for new or changing moles - Recommend daily use of broad spectrum spf 30+ sunscreen to sun-exposed areas.  -L axilla  Hemangiomas - Red papules - Discussed benign nature - Observe - Call for any changes  Actinic Damage - diffuse scaly erythematous macules with underlying dyspigmentation - Recommend daily broad spectrum sunscreen SPF 30+ to sun-exposed areas, reapply  every 2 hours as needed.  - Call for new or changing lesions.  Skin cancer screening performed today.  Purpura - Violaceous macules and patches - Benign - Related to age, sun damage and/or use of blood thinners - Observe - Can use OTC arnica containing moisturizer such as Dermend Bruise Formula if desired - Call for worsening or other concerns  AK (actinic keratosis) (20) face x 20  Destruction of lesion - face x 20 Complexity: simple   Destruction method: cryotherapy   Informed consent: discussed and consent obtained   Timeout:  patient name, date of birth, surgical site, and procedure verified Lesion destroyed using liquid nitrogen: Yes   Region frozen until ice ball extended beyond lesion: Yes   Outcome: patient tolerated procedure well with no complications   Post-procedure details: wound care instructions given    Inflamed seborrheic keratosis (4) face x 4  Destruction of lesion - face x 4 Complexity: simple   Destruction method: cryotherapy   Informed consent: discussed and consent obtained   Timeout:  patient name, date of birth, surgical site, and procedure verified Lesion destroyed using liquid nitrogen: Yes   Region frozen until ice ball extended beyond lesion: Yes   Outcome: patient tolerated procedure well with no complications   Post-procedure details: wound care instructions given    Skin cancer screening  Return in about 6 months (around 01/28/2021) for UBSE, AK f/u.  I, [name], RMA, am acting as scribe for Sarina Ser, MD .  Documentation: I have reviewed the above documentation for accuracy and  completeness, and I agree with the above.  Sarina Ser, MD

## 2020-07-31 NOTE — Patient Instructions (Signed)
Cryotherapy Aftercare  . Wash gently with soap and water everyday.   . Apply Vaseline and Band-Aid daily until healed.  

## 2020-08-16 DIAGNOSIS — R7309 Other abnormal glucose: Secondary | ICD-10-CM | POA: Diagnosis not present

## 2020-08-16 DIAGNOSIS — E039 Hypothyroidism, unspecified: Secondary | ICD-10-CM | POA: Diagnosis not present

## 2020-08-16 DIAGNOSIS — I10 Essential (primary) hypertension: Secondary | ICD-10-CM | POA: Diagnosis not present

## 2020-08-16 DIAGNOSIS — I700 Atherosclerosis of aorta: Secondary | ICD-10-CM | POA: Insufficient documentation

## 2020-08-16 DIAGNOSIS — E7800 Pure hypercholesterolemia, unspecified: Secondary | ICD-10-CM | POA: Diagnosis not present

## 2020-08-16 DIAGNOSIS — Z79899 Other long term (current) drug therapy: Secondary | ICD-10-CM | POA: Diagnosis not present

## 2021-01-01 DIAGNOSIS — H903 Sensorineural hearing loss, bilateral: Secondary | ICD-10-CM | POA: Diagnosis not present

## 2021-01-01 DIAGNOSIS — H6123 Impacted cerumen, bilateral: Secondary | ICD-10-CM | POA: Diagnosis not present

## 2021-01-08 DIAGNOSIS — H401131 Primary open-angle glaucoma, bilateral, mild stage: Secondary | ICD-10-CM | POA: Diagnosis not present

## 2021-01-08 DIAGNOSIS — H25043 Posterior subcapsular polar age-related cataract, bilateral: Secondary | ICD-10-CM | POA: Diagnosis not present

## 2021-01-08 DIAGNOSIS — H2511 Age-related nuclear cataract, right eye: Secondary | ICD-10-CM | POA: Diagnosis not present

## 2021-01-08 DIAGNOSIS — H2513 Age-related nuclear cataract, bilateral: Secondary | ICD-10-CM | POA: Diagnosis not present

## 2021-01-08 DIAGNOSIS — H25013 Cortical age-related cataract, bilateral: Secondary | ICD-10-CM | POA: Diagnosis not present

## 2021-01-20 DIAGNOSIS — Z20822 Contact with and (suspected) exposure to covid-19: Secondary | ICD-10-CM | POA: Diagnosis not present

## 2021-01-29 ENCOUNTER — Other Ambulatory Visit: Payer: Self-pay

## 2021-01-29 ENCOUNTER — Ambulatory Visit: Payer: PPO | Admitting: Dermatology

## 2021-01-29 ENCOUNTER — Encounter: Payer: Self-pay | Admitting: Dermatology

## 2021-01-29 DIAGNOSIS — L219 Seborrheic dermatitis, unspecified: Secondary | ICD-10-CM

## 2021-01-29 DIAGNOSIS — L853 Xerosis cutis: Secondary | ICD-10-CM

## 2021-01-29 DIAGNOSIS — L821 Other seborrheic keratosis: Secondary | ICD-10-CM | POA: Diagnosis not present

## 2021-01-29 DIAGNOSIS — D692 Other nonthrombocytopenic purpura: Secondary | ICD-10-CM

## 2021-01-29 DIAGNOSIS — L578 Other skin changes due to chronic exposure to nonionizing radiation: Secondary | ICD-10-CM

## 2021-01-29 DIAGNOSIS — L570 Actinic keratosis: Secondary | ICD-10-CM | POA: Diagnosis not present

## 2021-01-29 NOTE — Progress Notes (Signed)
   Follow-Up Visit   Subjective  Daniel Lewis is a 86 y.o. male who presents for the following: Actinic Keratosis (Of the face - check for new or persistent skin lesions). Pink scale of the nose, patient would like to discuss tx options.   The following portions of the chart were reviewed this encounter and updated as appropriate:   Tobacco  Allergies  Meds  Problems  Med Hx  Surg Hx  Fam Hx     Review of Systems:  No other skin or systemic complaints except as noted in HPI or Assessment and Plan.  Objective  Well appearing patient in no apparent distress; mood and affect are within normal limits.  A focused examination was performed including the face. Relevant physical exam findings are noted in the Assessment and Plan.  Objective  Face (15): Erythematous thin papules/macules with gritty scale.   Objective  Nasal root: Pinkness and scale   Assessment & Plan  AK (actinic keratosis) (15) Face Destruction of lesion - Face Complexity: simple   Destruction method: cryotherapy   Informed consent: discussed and consent obtained   Timeout:  patient name, date of birth, surgical site, and procedure verified Lesion destroyed using liquid nitrogen: Yes   Region frozen until ice ball extended beyond lesion: Yes   Outcome: patient tolerated procedure well with no complications   Post-procedure details: wound care instructions given    Seborrheic dermatitis Nasal root Discussed condition and treatment options in detail. Seborrheic Dermatitis  -  is a chronic persistent rash characterized by pinkness and scaling most commonly of the mid face but also can occur on the scalp (dandruff), ears; mid chest and mid back. It tends to be exacerbated by stress and cooler weather.  People who have neurologic disease may experience new onset or exacerbation of existing seborrheic dermatitis.  The condition is not curable but treatable and can be controlled.  Start OTC HC cream to aa's  QHS 3d/wk. Consider prescription options in the future.    Actinic Damage - chronic, secondary to cumulative UV radiation exposure/sun exposure over time - diffuse scaly erythematous macules with underlying dyspigmentation - Recommend daily broad spectrum sunscreen SPF 30+ to sun-exposed areas, reapply every 2 hours as needed.  - Recommend staying in the shade or wearing long sleeves, sun glasses (UVA+UVB protection) and wide brim hats (4-inch brim around the entire circumference of the hat). - Call for new or changing lesions.  Seborrheic Keratoses - Stuck-on, waxy, tan-brown papules and plaques  - Discussed benign etiology and prognosis. - Observe - Call for any changes  Purpura - Chronic; persistent and recurrent.  Treatable, but not curable. - Violaceous macules and patches - Benign - Related to trauma, age, sun damage and/or use of blood thinners, chronic use of topical and/or oral steroids - Observe - Can use OTC arnica containing moisturizer such as Dermend Bruise Formula if desired - Call for worsening or other concerns  Xerosis - diffuse xerotic patches - recommend gentle, hydrating skin care - gentle skin care handout given  Return in about 6 months (around 08/01/2021) for AK follow up .  [name], CMA, am acting as scribe for Sarina Ser, MD .  Documentation: I have reviewed the above documentation for accuracy and completeness, and I agree with the above.  Sarina Ser, MD

## 2021-02-13 DIAGNOSIS — Z1211 Encounter for screening for malignant neoplasm of colon: Secondary | ICD-10-CM | POA: Diagnosis not present

## 2021-02-13 DIAGNOSIS — I2510 Atherosclerotic heart disease of native coronary artery without angina pectoris: Secondary | ICD-10-CM | POA: Diagnosis not present

## 2021-02-13 DIAGNOSIS — Z0000 Encounter for general adult medical examination without abnormal findings: Secondary | ICD-10-CM | POA: Diagnosis not present

## 2021-02-13 DIAGNOSIS — E7800 Pure hypercholesterolemia, unspecified: Secondary | ICD-10-CM | POA: Diagnosis not present

## 2021-02-13 DIAGNOSIS — Z8546 Personal history of malignant neoplasm of prostate: Secondary | ICD-10-CM | POA: Diagnosis not present

## 2021-02-13 DIAGNOSIS — Z79899 Other long term (current) drug therapy: Secondary | ICD-10-CM | POA: Diagnosis not present

## 2021-02-13 DIAGNOSIS — I10 Essential (primary) hypertension: Secondary | ICD-10-CM | POA: Diagnosis not present

## 2021-02-13 DIAGNOSIS — R7309 Other abnormal glucose: Secondary | ICD-10-CM | POA: Diagnosis not present

## 2021-02-13 DIAGNOSIS — E039 Hypothyroidism, unspecified: Secondary | ICD-10-CM | POA: Diagnosis not present

## 2021-05-28 DIAGNOSIS — H401131 Primary open-angle glaucoma, bilateral, mild stage: Secondary | ICD-10-CM | POA: Diagnosis not present

## 2021-05-29 DIAGNOSIS — I10 Essential (primary) hypertension: Secondary | ICD-10-CM | POA: Diagnosis not present

## 2021-05-29 DIAGNOSIS — Z79899 Other long term (current) drug therapy: Secondary | ICD-10-CM | POA: Diagnosis not present

## 2021-05-29 DIAGNOSIS — E039 Hypothyroidism, unspecified: Secondary | ICD-10-CM | POA: Diagnosis not present

## 2021-05-29 DIAGNOSIS — R7309 Other abnormal glucose: Secondary | ICD-10-CM | POA: Diagnosis not present

## 2021-05-29 DIAGNOSIS — I2510 Atherosclerotic heart disease of native coronary artery without angina pectoris: Secondary | ICD-10-CM | POA: Diagnosis not present

## 2021-05-29 DIAGNOSIS — E782 Mixed hyperlipidemia: Secondary | ICD-10-CM | POA: Diagnosis not present

## 2021-06-20 DIAGNOSIS — H401131 Primary open-angle glaucoma, bilateral, mild stage: Secondary | ICD-10-CM | POA: Diagnosis not present

## 2021-07-02 DIAGNOSIS — H903 Sensorineural hearing loss, bilateral: Secondary | ICD-10-CM | POA: Diagnosis not present

## 2021-07-02 DIAGNOSIS — H6123 Impacted cerumen, bilateral: Secondary | ICD-10-CM | POA: Diagnosis not present

## 2021-07-10 ENCOUNTER — Encounter: Payer: Self-pay | Admitting: Ophthalmology

## 2021-07-10 DIAGNOSIS — H2512 Age-related nuclear cataract, left eye: Secondary | ICD-10-CM | POA: Diagnosis not present

## 2021-07-21 NOTE — Anesthesia Preprocedure Evaluation (Addendum)
Anesthesia Evaluation  Patient identified by MRN, date of birth, ID band Patient awake    Reviewed: Allergy & Precautions, NPO status , Patient's Chart, lab work & pertinent test results  History of Anesthesia Complications Negative for: history of anesthetic complications  Airway Mallampati: II   Neck ROM: Full    Dental  (+)    Pulmonary neg pulmonary ROS,    Pulmonary exam normal breath sounds clear to auscultation       Cardiovascular hypertension, + CAD and + Past MI  Normal cardiovascular exam Rhythm:Regular Rate:Normal     Neuro/Psych Vertigo     GI/Hepatic GERD  ,  Endo/Other  Hypothyroidism   Renal/GU negative Renal ROS     Musculoskeletal   Abdominal   Peds  Hematology  (+) Blood dyscrasia, anemia , Prostate CA   Anesthesia Other Findings   Reproductive/Obstetrics                            Anesthesia Physical Anesthesia Plan  ASA: 3  Anesthesia Plan: MAC   Post-op Pain Management:    Induction: Intravenous  PONV Risk Score and Plan: 1 and TIVA, Midazolam and Treatment may vary due to age or medical condition  Airway Management Planned: Nasal Cannula  Additional Equipment:   Intra-op Plan:   Post-operative Plan:   Informed Consent: I have reviewed the patients History and Physical, chart, labs and discussed the procedure including the risks, benefits and alternatives for the proposed anesthesia with the patient or authorized representative who has indicated his/her understanding and acceptance.       Plan Discussed with: CRNA  Anesthesia Plan Comments:        Anesthesia Quick Evaluation

## 2021-07-22 NOTE — Discharge Instructions (Signed)

## 2021-07-23 ENCOUNTER — Ambulatory Visit: Payer: PPO | Admitting: Anesthesiology

## 2021-07-23 ENCOUNTER — Other Ambulatory Visit: Payer: Self-pay

## 2021-07-23 ENCOUNTER — Encounter: Payer: Self-pay | Admitting: Ophthalmology

## 2021-07-23 ENCOUNTER — Ambulatory Visit
Admission: RE | Admit: 2021-07-23 | Discharge: 2021-07-23 | Disposition: A | Discharge status: Home / Self Care | Payer: PPO | Attending: Ophthalmology | Admitting: Ophthalmology

## 2021-07-23 ENCOUNTER — Encounter
Admission: RE | Disposition: A | Discharge status: Home / Self Care | Payer: Self-pay | Source: Home / Self Care | Attending: Ophthalmology

## 2021-07-23 DIAGNOSIS — Z7982 Long term (current) use of aspirin: Secondary | ICD-10-CM | POA: Diagnosis not present

## 2021-07-23 DIAGNOSIS — Z79899 Other long term (current) drug therapy: Secondary | ICD-10-CM | POA: Diagnosis not present

## 2021-07-23 DIAGNOSIS — H2512 Age-related nuclear cataract, left eye: Secondary | ICD-10-CM | POA: Insufficient documentation

## 2021-07-23 DIAGNOSIS — Z79890 Hormone replacement therapy (postmenopausal): Secondary | ICD-10-CM | POA: Diagnosis not present

## 2021-07-23 DIAGNOSIS — H4010X1 Unspecified open-angle glaucoma, mild stage: Secondary | ICD-10-CM | POA: Diagnosis not present

## 2021-07-23 DIAGNOSIS — Z8546 Personal history of malignant neoplasm of prostate: Secondary | ICD-10-CM | POA: Diagnosis not present

## 2021-07-23 DIAGNOSIS — H401121 Primary open-angle glaucoma, left eye, mild stage: Secondary | ICD-10-CM | POA: Diagnosis not present

## 2021-07-23 DIAGNOSIS — H25812 Combined forms of age-related cataract, left eye: Secondary | ICD-10-CM | POA: Diagnosis not present

## 2021-07-23 HISTORY — PX: CATARACT EXTRACTION W/PHACO: SHX586

## 2021-07-23 HISTORY — DX: Gastro-esophageal reflux disease without esophagitis: K21.9

## 2021-07-23 SURGERY — PHACOEMULSIFICATION, CATARACT, WITH IOL INSERTION
Anesthesia: Monitor Anesthesia Care | Site: Eye | Laterality: Left

## 2021-07-23 MED ORDER — SIGHTPATH DOSE#1 NA HYALUR & NA CHOND-NA HYALUR IO KIT
PACK | INTRAOCULAR | Status: DC | PRN
  Administered 2021-07-23 (×2): 1 via OPHTHALMIC

## 2021-07-23 MED ORDER — SIGHTPATH DOSE#1 BSS IO SOLN
INTRAOCULAR | Status: DC | PRN
  Administered 2021-07-23: 15 mL

## 2021-07-23 MED ORDER — ONDANSETRON HCL 4 MG/2ML IJ SOLN: 4.0000 mg | Freq: Once | INTRAMUSCULAR | Status: DC | PRN

## 2021-07-23 MED ORDER — ACETAMINOPHEN 325 MG PO TABS: 650.0000 mg | ORAL_TABLET | Freq: Once | ORAL | Status: DC | PRN

## 2021-07-23 MED ORDER — FENTANYL CITRATE (PF) 100 MCG/2ML IJ SOLN
INTRAMUSCULAR | Status: DC | PRN
  Administered 2021-07-23: 50 ug via INTRAVENOUS
  Administered 2021-07-23: 25 ug via INTRAVENOUS

## 2021-07-23 MED ORDER — SIGHTPATH DOSE#1 BSS IO SOLN
INTRAOCULAR | Status: DC | PRN
  Administered 2021-07-23: 1 mL

## 2021-07-23 MED ORDER — ACETAMINOPHEN 160 MG/5ML PO SOLN: 325.0000 mg | ORAL | Status: DC | PRN

## 2021-07-23 MED ORDER — SIGHTPATH DOSE#1 BSS IO SOLN
INTRAOCULAR | Status: DC | PRN
  Administered 2021-07-23: 57 mL via OPHTHALMIC

## 2021-07-23 MED ORDER — CYCLOPENTOLATE HCL 2 % OP SOLN
1.0000 [drp] | OPHTHALMIC | Status: DC | PRN
  Administered 2021-07-23 (×3): 1 [drp] via OPHTHALMIC

## 2021-07-23 MED ORDER — MIDAZOLAM HCL 2 MG/2ML IJ SOLN
INTRAMUSCULAR | Status: DC | PRN
  Administered 2021-07-23: 1 mg via INTRAVENOUS

## 2021-07-23 MED ORDER — CEFUROXIME OPHTHALMIC INJECTION 1 MG/0.1 ML
INJECTION | OPHTHALMIC | Status: DC | PRN
  Administered 2021-07-23: 0.1 mL via INTRACAMERAL

## 2021-07-23 MED ORDER — MOXIFLOXACIN HCL 0.5 % OP SOLN: OPHTHALMIC | Status: DC | PRN

## 2021-07-23 MED ORDER — LACTATED RINGERS IV SOLN: INTRAVENOUS | Status: DC

## 2021-07-23 MED ORDER — TETRACAINE HCL 0.5 % OP SOLN
1.0000 [drp] | OPHTHALMIC | Status: DC | PRN
  Administered 2021-07-23 (×3): 1 [drp] via OPHTHALMIC

## 2021-07-23 MED ORDER — PHENYLEPHRINE HCL 10 % OP SOLN
1.0000 [drp] | OPHTHALMIC | Status: DC | PRN
Start: 2021-07-23 — End: 2021-07-23
  Administered 2021-07-23 (×3): 1 [drp] via OPHTHALMIC

## 2021-07-23 SURGICAL SUPPLY — 19 items
BLADE DUAL KAHOOK SINGLE USE (BLADE) ×2 IMPLANT
CANNULA ANT/CHMB 27GA (MISCELLANEOUS) ×2 IMPLANT
GLOVE SRG 8 PF TXTR STRL LF DI (GLOVE) ×1 IMPLANT
GLOVE SURG ENC TEXT LTX SZ7.5 (GLOVE) ×2 IMPLANT
GLOVE SURG UNDER POLY LF SZ8 (GLOVE) ×2
GOWN STRL REUS W/ TWL LRG LVL3 (GOWN DISPOSABLE) ×2 IMPLANT
GOWN STRL REUS W/TWL LRG LVL3 (GOWN DISPOSABLE) ×4
ICLIP (OPHTHALMIC RELATED) ×2 IMPLANT
LENS IOL EYHANCE TORIC II ×2 IMPLANT
LENS IOL EYHANCE TRC 300 19.0 ×1 IMPLANT
MARKER SKIN DUAL TIP RULER LAB (MISCELLANEOUS) ×2 IMPLANT
NEEDLE CAPSULORHEX 25GA (NEEDLE) ×2 IMPLANT
NEEDLE FILTER BLUNT 18X 1/2SAF (NEEDLE) ×2
NEEDLE FILTER BLUNT 18X1 1/2 (NEEDLE) ×2 IMPLANT
PACK EYE AFTER SURG (MISCELLANEOUS) ×2 IMPLANT
SYR 3ML LL SCALE MARK (SYRINGE) ×4 IMPLANT
SYR TB 1ML LUER SLIP (SYRINGE) ×2 IMPLANT
WATER STERILE IRR 250ML POUR (IV SOLUTION) ×2 IMPLANT
WIPE NON LINTING 3.25X3.25 (MISCELLANEOUS) ×2 IMPLANT

## 2021-07-23 NOTE — Op Note (Signed)
LOCATION:  [facility]   PREOPERATIVE DIAGNOSIS:  Nuclear sclerotic cataract of the left eye.  H25.12 Mild stage open angle glaucoma POSTOPERATIVE DIAGNOSIS:  Nuclear sclerotic cataract of the left eye.  Mild stage open angle glaucoma PROCEDURE:  Phacoemulsification with Toric posterior chamber intraocular lens placement of the left eye. [place] dual blade goniotomy left eye  Ultrasound time: Procedure(s): CATARACT EXTRACTION PHACO AND INTRAOCULAR LENS PLACEMENT (IOC) LEFT TORIC  8.27 01:12.2 LENS [place] goniotomy (Left)  LENS:   Implant Name Type Inv. Item Serial No. Manufacturer Lot No. LRB No. Used Action  Tecnis Eyhance Toric II IOL Intraocular Lens  [id number]   Left 1 Implanted     [id number] 19.0 DToric intraocular lens with 3 diopters of cylindrical power with axis orientation at 174 degrees.     SURGEON:  Wyonia Hough, MD   ANESTHESIA:  Topical with tetracaine drops and 2% Xylocaine jelly, augmented with 1% preservative-free intracameral lidocaine.  COMPLICATIONS:  None.   DESCRIPTION OF PROCEDURE:  The patient was identified in the holding room and transported to the operating suite and placed in the supine position under the operating microscope.  The left eye was identified as the operative eye, and it was prepped and draped in the usual sterile ophthalmic fashion.    A clear-corneal paracentesis incision was made at the 4:30 position.  0.5 ml of preservative-free 1% lidocaine was injected into the anterior chamber. The anterior chamber was filled with Viscoat.  A 2.4 millimeter near clear corneal incision was then made at the 2:30 position.  The microscope was adjusted and a gonioprism was used to visulaize the trabecular meshwork.  [facility] Dual Blade was advanced across the anterior chamber under viscoelastic.  The blade was used to mark the trabecular meshwork at the 9:00 position.  The blade was placed two clock hours clockwise into the meshwork.  Proper  postioning was confirmed.  The blade ws passed counterclockwise through the meshwork to excise approximately two to three clock-hours of trabecular meshwork.  A cystotome and capsulorrhexis forceps were then used to make a curvilinear capsulorrhexis.  Hydrodissection and hydrodelineation were then performed using balanced salt solution.   Phacoemulsification was then used in stop and chop fashion to remove the lens, nucleus and epinucleus.  The remaining cortex was aspirated using the irrigation and aspiration handpiece.  Provisc viscoelastic was then placed into the capsular bag to distend it for lens placement.  The Verion digital marker was used to align the implant at the intended axis.   A Toric lens was then injected into the capsular bag.  It was rotated clockwise until the axis marks on the lens were approximately 15 degrees in the counterclockwise direction to the intended alignment.  The viscoelastic was aspirated from the eye using the irrigation aspiration handpiece.  Then, a Koch spatula through the sideport incision was used to rotate the lens in a clockwise direction until the axis markings of the intraocular lens were lined up with the Verion alignment.  Balanced salt solution was then used to hydrate the wounds. Cefuroxime 0.1 ml of a '10mg'$ /ml solution was injected into the anterior chamber for a dose of 1 mg of intracameral antibiotic at the completion of the case.    The eye was noted to have a physiologic pressure and there was no wound leak noted.  Erythromycin ointment was applied to the eye.  The patient was taken to the recovery room in stable condition having had no complications of anesthesia or  surgery.  Laguana Desautel 07/23/2021, 12:01 PM

## 2021-07-23 NOTE — H&P (Signed)
[facility]   Primary Care Physician:  Idelle Crouch, MD Ophthalmologist: Dr. Leandrew Koyanagi  Pre-Procedure History & Physical: HPI:  Daniel Lewis is a 86 y.o. male here for ophthalmic surgery.   Past Medical History:  Diagnosis Date   Anemia    ASHD (arteriosclerotic heart disease)    Chronic kidney disease    Erectile dysfunction    GERD (gastroesophageal reflux disease)    Hyperlipidemia    Hypertension    Hypothyroidism    MI (myocardial infarction) ([place])    Prostate cancer ([place])    prostate cancer   Vertigo     Past Surgical History:  Procedure Laterality Date   CHOLECYSTECTOMY     COLONOSCOPY     COLONOSCOPY WITH PROPOFOL N/A 12/16/2015   Procedure: COLONOSCOPY WITH PROPOFOL;  Surgeon: Manya Silvas, MD;  Location: [place];  Service: Endoscopy;  Laterality: N/A;   INGUINAL HERNIA REPAIR     LAPAROTOMY N/A 12/02/2018   Procedure: EXPLORATORY LAPAROTOMY, LYSIS OF ADHESIONS, POSSIBLE SBO;  Surgeon: Jules Husbands, MD;  Location: [facility];  Service: General;  Laterality: N/A;   PROSTATECTOMY     XI ROBOTIC ASSISTED INGUINAL HERNIA REPAIR WITH MESH Left 06/18/2020   Procedure: XI ROBOTIC ASSISTED INGUINAL HERNIA REPAIR WITH MESH, possible bilateral;  Surgeon: Jules Husbands, MD;  Location: [facility];  Service: General;  Laterality: Left;    Prior to Admission medications   Medication Sig Start Date End Date Taking? Authorizing Provider  Ascorbic Acid (VITAMIN C) 500 MG CAPS Take 500 mg by mouth daily.    Yes [provider]  atenolol (TENORMIN) 50 MG tablet Take 50 mg by mouth daily.  06/26/15  Yes [provider]  atorvastatin (LIPITOR) 10 MG tablet Take 10 mg by mouth daily.    Yes [provider]  Omega 3-6-9 Fatty Acids (OMEGA 3-6-9 COMPLEX PO) Take 1 capsule by mouth daily.    Yes [provider]  SYNTHROID 75 MCG tablet Take 75 mcg by mouth daily before breakfast.  08/28/15  Yes [provider]   VITAMIN D, CHOLECALCIFEROL, PO Take 1 tablet by mouth daily.    Yes [provider]  zinc gluconate 50 MG tablet Take 50 mg by mouth daily.    Yes [provider]  aspirin EC 81 MG tablet Take 81 mg by mouth daily.     [provider]  HYDROcodone-acetaminophen (NORCO/VICODIN) 5-325 MG tablet Take 1-2 tablets by mouth every 6 (six) hours as needed for moderate pain. Patient not taking: Reported on 07/10/2021 06/18/20   Jules Husbands, MD  OVER THE COUNTER MEDICATION Take 2 capsules by mouth daily. Multi Greens     [provider]  TRAVATAN Z 0.004 % SOLN ophthalmic solution Place 1 drop into both eyes at bedtime. 08/21/15   [provider]    Allergies as of 06/25/2021   (No Known Allergies)    History reviewed. No pertinent family history.  Social History   Socioeconomic History   Marital status: Married    Spouse name: Not on file   Number of children: Not on file   Years of education: Not on file   Highest education level: Not on file  Occupational History   Not on file  Tobacco Use   Smoking status: Never   Smokeless tobacco: Never  Substance and Sexual Activity   Alcohol use: No   Drug use: No   Sexual activity: Not on file  Other  Topics Concern   Not on file  Social History Narrative   Not on file   Social Determinants of Health   Financial Resource Strain: Not on file  Food Insecurity: Not on file  Transportation Needs: Not on file  Physical Activity: Not on file  Stress: Not on file  Social Connections: Not on file  Intimate Partner Violence: Not on file    Review of Systems: See HPI, otherwise negative ROS  Physical Exam: BP (!) 145/69   Pulse (!) 47   Temp (!) 97.5 F (36.4 C) (Temporal)   Resp 20   Ht '5\' 8"'$  (1.727 m)   Wt 60.8 kg   SpO2 97%   BMI 20.37 kg/m  General:   Alert,  pleasant and cooperative in NAD Head:  Normocephalic and atraumatic. Lungs:  Clear to auscultation.    Heart:  Regular rate  and rhythm.   Impression/Plan: Daniel Lewis is here for ophthalmic surgery.  Risks, benefits, limitations, and alternatives regarding ophthalmic surgery have been reviewed with the patient.  Questions have been answered.  All parties agreeable.   Leandrew Koyanagi, MD  07/23/2021, 10:49 AM

## 2021-07-23 NOTE — Anesthesia Postprocedure Evaluation (Signed)
Anesthesia Post Note  Patient: Daniel Lewis  Procedure(s) Performed: CATARACT EXTRACTION PHACO AND INTRAOCULAR LENS PLACEMENT (IOC) LEFT TORIC  8.27 01:12.2 LENS [place] goniotomy (Left: Eye)     Patient location during evaluation: PACU Anesthesia Type: MAC Level of consciousness: awake and alert, oriented and patient cooperative Pain management: pain level controlled Vital Signs Assessment: post-procedure vital signs reviewed and stable Respiratory status: spontaneous breathing, nonlabored ventilation and respiratory function stable Cardiovascular status: blood pressure returned to baseline and stable Postop Assessment: adequate PO intake Anesthetic complications: no   No notable events documented.  Darrin Nipper

## 2021-07-23 NOTE — Transfer of Care (Signed)
Immediate Anesthesia Transfer of Care Note  Patient: Daniel Lewis  Procedure(s) Performed: CATARACT EXTRACTION PHACO AND INTRAOCULAR LENS PLACEMENT (IOC) LEFT TORIC  8.27 01:12.2 LENS [place] goniotomy (Left: Eye)  Patient Location: PACU  Anesthesia Type: MAC  Level of Consciousness: awake, alert  and patient cooperative  Airway and Oxygen Therapy: Patient Spontanous Breathing and Patient connected to supplemental oxygen  Post-op Assessment: Post-op Vital signs reviewed, Patient's Cardiovascular Status Stable, Respiratory Function Stable, Patent Airway and No signs of Nausea or vomiting  Post-op Vital Signs: Reviewed and stable  Complications: No notable events documented.

## 2021-07-24 ENCOUNTER — Other Ambulatory Visit: Payer: Self-pay

## 2021-07-24 ENCOUNTER — Encounter: Payer: Self-pay | Admitting: Ophthalmology

## 2021-08-01 DIAGNOSIS — H2511 Age-related nuclear cataract, right eye: Secondary | ICD-10-CM | POA: Diagnosis not present

## 2021-08-11 NOTE — Discharge Instructions (Signed)

## 2021-08-13 ENCOUNTER — Ambulatory Visit: Payer: PPO | Admitting: Anesthesiology

## 2021-08-13 ENCOUNTER — Ambulatory Visit
Admission: RE | Admit: 2021-08-13 | Discharge: 2021-08-13 | Disposition: A | Discharge status: Home / Self Care | Payer: PPO | Attending: Ophthalmology | Admitting: Ophthalmology

## 2021-08-13 ENCOUNTER — Encounter
Admission: RE | Disposition: A | Discharge status: Home / Self Care | Payer: Self-pay | Source: Home / Self Care | Attending: Ophthalmology

## 2021-08-13 ENCOUNTER — Encounter: Payer: Self-pay | Admitting: Ophthalmology

## 2021-08-13 ENCOUNTER — Other Ambulatory Visit: Payer: Self-pay

## 2021-08-13 DIAGNOSIS — Z9842 Cataract extraction status, left eye: Secondary | ICD-10-CM | POA: Diagnosis not present

## 2021-08-13 DIAGNOSIS — H401121 Primary open-angle glaucoma, left eye, mild stage: Secondary | ICD-10-CM | POA: Diagnosis not present

## 2021-08-13 DIAGNOSIS — Z8546 Personal history of malignant neoplasm of prostate: Secondary | ICD-10-CM | POA: Insufficient documentation

## 2021-08-13 DIAGNOSIS — H2511 Age-related nuclear cataract, right eye: Secondary | ICD-10-CM | POA: Insufficient documentation

## 2021-08-13 DIAGNOSIS — Z79899 Other long term (current) drug therapy: Secondary | ICD-10-CM | POA: Insufficient documentation

## 2021-08-13 DIAGNOSIS — Z79890 Hormone replacement therapy (postmenopausal): Secondary | ICD-10-CM | POA: Insufficient documentation

## 2021-08-13 DIAGNOSIS — H25811 Combined forms of age-related cataract, right eye: Secondary | ICD-10-CM | POA: Diagnosis not present

## 2021-08-13 DIAGNOSIS — H401111 Primary open-angle glaucoma, right eye, mild stage: Secondary | ICD-10-CM | POA: Insufficient documentation

## 2021-08-13 DIAGNOSIS — Z961 Presence of intraocular lens: Secondary | ICD-10-CM | POA: Diagnosis not present

## 2021-08-13 HISTORY — PX: CATARACT EXTRACTION W/PHACO: SHX586

## 2021-08-13 SURGERY — PHACOEMULSIFICATION, CATARACT, WITH IOL INSERTION
Anesthesia: Monitor Anesthesia Care | Site: Eye | Laterality: Right

## 2021-08-13 MED ORDER — LACTATED RINGERS IV SOLN: INTRAVENOUS | Status: DC

## 2021-08-13 MED ORDER — ONDANSETRON HCL 4 MG/2ML IJ SOLN: 4.0000 mg | Freq: Once | INTRAMUSCULAR | Status: DC | PRN

## 2021-08-13 MED ORDER — SIGHTPATH DOSE#1 NA HYALUR & NA CHOND-NA HYALUR IO KIT
PACK | INTRAOCULAR | Status: DC | PRN
  Administered 2021-08-13: 1 via OPHTHALMIC

## 2021-08-13 MED ORDER — CEFUROXIME OPHTHALMIC INJECTION 1 MG/0.1 ML
INJECTION | OPHTHALMIC | Status: DC | PRN
  Administered 2021-08-13: 0.1 mL via OPHTHALMIC

## 2021-08-13 MED ORDER — ACETAMINOPHEN 160 MG/5ML PO SOLN: 975.0000 mg | Freq: Once | ORAL | Status: DC | PRN

## 2021-08-13 MED ORDER — FENTANYL CITRATE (PF) 100 MCG/2ML IJ SOLN
INTRAMUSCULAR | Status: DC | PRN
  Administered 2021-08-13: 50 ug via INTRAVENOUS

## 2021-08-13 MED ORDER — MIDAZOLAM HCL 2 MG/2ML IJ SOLN
INTRAMUSCULAR | Status: DC | PRN
  Administered 2021-08-13: 1 mg via INTRAVENOUS

## 2021-08-13 MED ORDER — SIGHTPATH DOSE#1 BSS IO SOLN
INTRAOCULAR | Status: DC | PRN
  Administered 2021-08-13: 1 mL via INTRAMUSCULAR

## 2021-08-13 MED ORDER — ARMC OPHTHALMIC DILATING DROPS
1.0000 "application " | OPHTHALMIC | Status: DC | PRN
  Administered 2021-08-13 (×3): 1 via OPHTHALMIC

## 2021-08-13 MED ORDER — ACETAMINOPHEN 500 MG PO TABS: 1000.0000 mg | ORAL_TABLET | Freq: Once | ORAL | Status: DC | PRN

## 2021-08-13 MED ORDER — TETRACAINE HCL 0.5 % OP SOLN
1.0000 [drp] | OPHTHALMIC | Status: DC | PRN
  Administered 2021-08-13 (×3): 1 [drp] via OPHTHALMIC

## 2021-08-13 MED ORDER — SIGHTPATH DOSE#1 BSS IO SOLN
INTRAOCULAR | Status: DC | PRN
  Administered 2021-08-13: 15 mL

## 2021-08-13 MED ORDER — SIGHTPATH DOSE#1 BSS IO SOLN
INTRAOCULAR | Status: DC | PRN
  Administered 2021-08-13: 78 mL via OPHTHALMIC

## 2021-08-13 SURGICAL SUPPLY — 17 items
BLADE DUAL KAHOOK SINGLE USE (BLADE) ×2 IMPLANT
GLOVE SRG 8 PF TXTR STRL LF DI (GLOVE) ×1 IMPLANT
GLOVE SURG ENC TEXT LTX SZ7.5 (GLOVE) ×2 IMPLANT
GLOVE SURG UNDER POLY LF SZ8 (GLOVE) ×2
GOWN STRL REUS W/ TWL LRG LVL3 (GOWN DISPOSABLE) ×2 IMPLANT
GOWN STRL REUS W/TWL LRG LVL3 (GOWN DISPOSABLE) ×4
ICLIP (OPHTHALMIC RELATED) ×2 IMPLANT
LENS IOL EYHANCE TORIC II 19.0 ×2 IMPLANT
LENS IOL EYHANCE TRC 225 19.0 ×1 IMPLANT
LENS IOL EYHNC TORIC 225 19.0 ×1 IMPLANT
MARKER SKIN DUAL TIP RULER LAB (MISCELLANEOUS) ×2 IMPLANT
NEEDLE FILTER BLUNT 18X 1/2SAF (NEEDLE) ×2
NEEDLE FILTER BLUNT 18X1 1/2 (NEEDLE) ×2 IMPLANT
SYR 3ML LL SCALE MARK (SYRINGE) ×4 IMPLANT
SYR TB 1ML LUER SLIP (SYRINGE) ×2 IMPLANT
WATER STERILE IRR 250ML POUR (IV SOLUTION) ×2 IMPLANT
WIPE NON LINTING 3.25X3.25 (MISCELLANEOUS) ×2 IMPLANT

## 2021-08-13 NOTE — Transfer of Care (Signed)
Immediate Anesthesia Transfer of Care Note  Patient: Daniel Lewis  Procedure(s) Performed: CATARACT EXTRACTION PHACO AND INTRAOCULAR LENS PLACEMENT (IOC) RIGHT TORIC LENS [place] dual blade goniotomy (Right: Eye)  Patient Location: PACU  Anesthesia Type: MAC  Level of Consciousness: awake, alert  and patient cooperative  Airway and Oxygen Therapy: Patient Spontanous Breathing and Patient connected to supplemental oxygen  Post-op Assessment: Post-op Vital signs reviewed, Patient's Cardiovascular Status Stable, Respiratory Function Stable, Patent Airway and No signs of Nausea or vomiting  Post-op Vital Signs: Reviewed and stable  Complications: No notable events documented.

## 2021-08-13 NOTE — Anesthesia Postprocedure Evaluation (Signed)
Anesthesia Post Note  Patient: Daniel Lewis  Procedure(s) Performed: CATARACT EXTRACTION PHACO AND INTRAOCULAR LENS PLACEMENT (IOC) RIGHT TORIC LENS [place] dual blade goniotomy (Right: Eye)     Patient location during evaluation: PACU Anesthesia Type: MAC Level of consciousness: awake and alert Pain management: pain level controlled Vital Signs Assessment: post-procedure vital signs reviewed and stable Respiratory status: spontaneous breathing, nonlabored ventilation and respiratory function stable Cardiovascular status: stable and blood pressure returned to baseline Postop Assessment: no apparent nausea or vomiting Anesthetic complications: no   No notable events documented.  [name]

## 2021-08-13 NOTE — Anesthesia Preprocedure Evaluation (Signed)
Anesthesia Evaluation  Patient identified by MRN, date of birth, ID band Patient awake    Reviewed: Allergy & Precautions, NPO status , Patient's Chart, lab work & pertinent test results  History of Anesthesia Complications Negative for: history of anesthetic complications  Airway Mallampati: II   Neck ROM: Full    Dental  (+)    Pulmonary neg pulmonary ROS,    Pulmonary exam normal breath sounds clear to auscultation       Cardiovascular hypertension, + CAD and + Past MI  Normal cardiovascular exam Rhythm:Regular Rate:Normal     Neuro/Psych Vertigo     GI/Hepatic GERD  ,  Endo/Other  Hypothyroidism   Renal/GU negative Renal ROS     Musculoskeletal   Abdominal   Peds  Hematology  (+) Blood dyscrasia, anemia , Prostate CA   Anesthesia Other Findings   Reproductive/Obstetrics                             Anesthesia Physical  Anesthesia Plan  ASA: 3  Anesthesia Plan: MAC   Post-op Pain Management:    Induction: Intravenous  PONV Risk Score and Plan: 1 and TIVA, Midazolam and Treatment may vary due to age or medical condition  Airway Management Planned: Nasal Cannula  Additional Equipment:   Intra-op Plan:   Post-operative Plan:   Informed Consent: I have reviewed the patients History and Physical, chart, labs and discussed the procedure including the risks, benefits and alternatives for the proposed anesthesia with the patient or authorized representative who has indicated his/her understanding and acceptance.       Plan Discussed with: CRNA  Anesthesia Plan Comments:         Anesthesia Quick Evaluation

## 2021-08-13 NOTE — Anesthesia Procedure Notes (Signed)
Procedure Name: MAC Date/Time: 08/13/2021 12:13 PM Performed by: Jeannene Patella, CRNA Pre-anesthesia Checklist: Patient identified, Emergency Drugs available, Suction available, Timeout performed and Patient being monitored Patient Re-evaluated:Patient Re-evaluated prior to induction Oxygen Delivery Method: Nasal cannula Placement Confirmation: positive ETCO2

## 2021-08-13 NOTE — H&P (Signed)
[facility]   Primary Care Physician:  Idelle Crouch, MD Ophthalmologist: Dr. Leandrew Koyanagi  Pre-Procedure History & Physical: HPI:  Daniel Lewis is a 86 y.o. male here for ophthalmic surgery.   Past Medical History:  Diagnosis Date   Anemia    ASHD (arteriosclerotic heart disease)    Chronic kidney disease    Erectile dysfunction    GERD (gastroesophageal reflux disease)    Hyperlipidemia    Hypertension    Hypothyroidism    MI (myocardial infarction) ([place])    Prostate cancer ([place])    prostate cancer   Vertigo     Past Surgical History:  Procedure Laterality Date   CATARACT EXTRACTION W/PHACO Left 07/23/2021   Procedure: CATARACT EXTRACTION PHACO AND INTRAOCULAR LENS PLACEMENT ([place]) LEFT TORIC  8.27 01:12.2 LENS [place] goniotomy;  Surgeon: Leandrew Koyanagi, MD;  Location: [facility];  Service: Ophthalmology;  Laterality: Left;   CHOLECYSTECTOMY     COLONOSCOPY     COLONOSCOPY WITH PROPOFOL N/A 12/16/2015   Procedure: COLONOSCOPY WITH PROPOFOL;  Surgeon: Manya Silvas, MD;  Location: [facility];  Service: Endoscopy;  Laterality: N/A;   INGUINAL HERNIA REPAIR     LAPAROTOMY N/A 12/02/2018   Procedure: EXPLORATORY LAPAROTOMY, LYSIS OF ADHESIONS, POSSIBLE SBO;  Surgeon: Jules Husbands, MD;  Location: [facility];  Service: General;  Laterality: N/A;   PROSTATECTOMY     XI ROBOTIC ASSISTED INGUINAL HERNIA REPAIR WITH MESH Left 06/18/2020   Procedure: XI ROBOTIC ASSISTED INGUINAL HERNIA REPAIR WITH MESH, possible bilateral;  Surgeon: Jules Husbands, MD;  Location: [facility];  Service: General;  Laterality: Left;    Prior to Admission medications   Medication Sig Start Date End Date Taking? Authorizing Provider  Ascorbic Acid (VITAMIN C) 500 MG CAPS Take 500 mg by mouth daily.    Yes [provider]  aspirin EC 81 MG tablet Take 81 mg by mouth daily.    Yes [provider]  atenolol (TENORMIN) 50 MG tablet Take 50 mg by  mouth daily.  06/26/15  Yes [provider]  atorvastatin (LIPITOR) 10 MG tablet Take 10 mg by mouth daily.    Yes [provider]  Omega 3-6-9 Fatty Acids (OMEGA 3-6-9 COMPLEX PO) Take 1 capsule by mouth daily.    Yes [provider]  OVER THE COUNTER MEDICATION Take 2 capsules by mouth daily. Multi Greens    Yes [provider]  SYNTHROID 75 MCG tablet Take 75 mcg by mouth daily before breakfast.  08/28/15  Yes [provider]  TRAVATAN Z 0.004 % SOLN ophthalmic solution Place 1 drop into both eyes at bedtime. 08/21/15  Yes [provider]  VITAMIN D, CHOLECALCIFEROL, PO Take 1 tablet by mouth daily.    Yes [provider]  zinc gluconate 50 MG tablet Take 50 mg by mouth daily.    Yes [provider]  HYDROcodone-acetaminophen (NORCO/VICODIN) 5-325 MG tablet Take 1-2 tablets by mouth every 6 (six) hours as needed for moderate pain. Patient not taking: Reported on 07/10/2021 06/18/20   Jules Husbands, MD    Allergies as of 06/25/2021   (No Known Allergies)    History reviewed. No pertinent family history.  Social History   Socioeconomic History   Marital status: Married    Spouse name: Not on file   Number of children: Not on file   Years of education: Not on file   Highest education level: Not on file  Occupational History  Not on file  Tobacco Use   Smoking status: Never   Smokeless tobacco: Never  Substance and Sexual Activity   Alcohol use: No   Drug use: No   Sexual activity: Not on file  Other Topics Concern   Not on file  Social History Narrative   Not on file   Social Determinants of Health   Financial Resource Strain: Not on file  Food Insecurity: Not on file  Transportation Needs: Not on file  Physical Activity: Not on file  Stress: Not on file  Social Connections: Not on file  Intimate Partner Violence: Not on file    Review of Systems: See HPI, otherwise negative ROS  Physical  Exam: BP (!) 141/73   Pulse (!) 46   Temp (!) 97.5 F (36.4 C) (Temporal)   Resp 18   Ht 5\' 8"  (1.727 m)   Wt 61.2 kg   SpO2 97%   BMI 20.53 kg/m  General:   Alert,  pleasant and cooperative in NAD Head:  Normocephalic and atraumatic. Lungs:  Clear to auscultation.    Heart:  Regular rate and rhythm.   Impression/Plan: [name] is here for ophthalmic surgery.  Risks, benefits, limitations, and alternatives regarding ophthalmic surgery have been reviewed with the patient.  Questions have been answered.  All parties agreeable.   Leandrew Koyanagi, MD  08/13/2021, 11:36 AM

## 2021-08-13 NOTE — Op Note (Signed)
PREOPERATIVE DIAGNOSIS:  Nuclear sclerotic cataract  right eye. 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 Item Serial No. Manufacturer Lot No. LRB No. Used Action  LENS IOL EYHANCE TORIC II 19.0 - [id number]  LENS IOL EYHANCE TORIC II 19.0 [id number] JOHNSON   Right 1 Implanted  [id number] 19.0 D at 13 degrees  SURGEON:  Wyonia Hough, MD   ANESTHESIA:  Topical with tetracaine drops augmented with 1% preservative-free intracameral lidocaine.    COMPLICATIONS:  None.   DESCRIPTION OF PROCEDURE:  The patient was identified in the holding room and transported to the operating room and placed in the supine position under the operating microscope.  The right eye was identified as the operative eye and it was prepped and draped in the usual sterile ophthalmic fashion.   A 1 millimeter clear-corneal paracentesis was made at the 12:00 position.  0.5 ml of preservative-free 1% lidocaine was injected into the anterior chamber.  The anterior chamber was filled with Viscoat viscoelastic.  A 2.4 millimeter keratome was used to make a near-clear corneal incision at the 9:00 position. The microscope was adjusted and a gonioprism was used to visulaize the trabecular meshwork.  The [facility] Dual Blade was advanced across the anterior chamber under viscoelastic.  The blade was used to mark the trabecular meshwork at the 1:30 position.  The blade was placed two clock hours clockwise into the meshwork.  Proper  postioning was confirmed.  The blade ws passed counterclockwise through the meshwork to excise approximately two to three clock-hours of trabecular meshwork.   A curvilinear capsulorrhexis was made with a cystotome and capsulorrhexis forceps.  Balanced salt solution was used to hydrodissect and hydrodelineate the nucleus.   Phacoemulsification was then used in stop and chop fashion to remove the lens nucleus and epinucleus.  The remaining cortex was then removed using the irrigation and aspiration handpiece. Provisc was then placed into the capsular bag to distend it for lens placement.  A Toric lens was then injected into the capsular bag.  It was rotated clockwise until the axis marks on the lens were approximately 15 degrees in the counterclockwise direction to the intended alignment.  The viscoelastic was aspirated from the eye using the irrigation aspiration handpiece.  Then, a Koch spatula through the sideport incision was used to rotate the lens in a clockwise direction until the axis markings of the intraocular lens were lined up with the Verion alignment.  Balanced salt solution was then used to hydrate the wounds. Wounds were hydrated with balanced salt solution.  The anterior chamber was inflated to a physiologic pressure with balanced salt solution.  No wound leaks were noted. Cefuroxime 0.1 ml of a 10mg /ml solution was injected into the anterior chamber for a dose of 1 mg of intracameral antibiotic at the completion of the case.  The patient was taken to the recovery room in stable condition without complications of anesthesia or surgery.

## 2021-08-14 ENCOUNTER — Encounter: Payer: Self-pay | Admitting: Ophthalmology

## 2021-08-22 ENCOUNTER — Ambulatory Visit: Payer: PPO | Admitting: Cardiovascular Disease

## 2021-08-22 ENCOUNTER — Other Ambulatory Visit: Payer: Self-pay

## 2021-08-22 ENCOUNTER — Encounter: Payer: Self-pay | Admitting: Cardiovascular Disease

## 2021-08-22 VITALS — BP 150/68 | HR 43 | Ht 68.0 in | Wt 138.2 lb

## 2021-08-22 DIAGNOSIS — I10 Essential (primary) hypertension: Secondary | ICD-10-CM

## 2021-08-22 DIAGNOSIS — R001 Bradycardia, unspecified: Secondary | ICD-10-CM

## 2021-08-22 DIAGNOSIS — I25118 Atherosclerotic heart disease of native coronary artery with other forms of angina pectoris: Secondary | ICD-10-CM | POA: Diagnosis not present

## 2021-08-22 DIAGNOSIS — E782 Mixed hyperlipidemia: Secondary | ICD-10-CM | POA: Diagnosis not present

## 2021-08-22 NOTE — Progress Notes (Signed)
Cardiology Office Note  Date:  08/22/2021   ID:  [name], DOB Mar 14, 1935, MRN [id number]  PCP:  Daniel Crouch, MD   Chief Complaint  Patient presents with   New Patient (Initial Visit)    Self ref to establish care for CAD; angioplasty @ [facility] 30 years ago. Medications reviewed by the patient verbally. "Doing well."     HPI:  [name] is a 86 year old gentleman with past medical history of Hypertension Hyperlipidemia Thyroid disease  coronary artery disease with angioplasty over 10 years ago Referred to our office by Dr. Doy Hutching for consultation of his coronary disease  Seen last year by cardiology at outside facility for preoperative evaluation for hernia surgery Bradycardia at that time rates in the 27s At that time had stress test showing no ischemia In general reports no anginal symptoms  Preoperative ECG revealed sinus bradycardia rate of 47 bpm, with left ventricular hypertrophy, and evidence for old septal myocardial infarction.  He was on atenolol 50 at the time  The patient denies chest pain or shortness of breath. He denies palpitations or heart racing. He denies peripheral edema.   Stress test July 2021 Myoview, low risk study  Echocardiogram 2015 Ejection fraction greater than 55%  Total chol 127, LDL 66 in 01/2021  Cath:  50 to 75% stenosis proximal LAD.   Remains on atenolol 50 mg daily, No near syncope or syncope but does have some fatigue  EKG personally reviewed by myself on todays visit Sinus bradycardia rate 43 bpm left anterior fascicular block, nonspecific ST abnormality  PMH:   has a past medical history of Anemia, ASHD (arteriosclerotic heart disease), Chronic kidney disease, Erectile dysfunction, GERD (gastroesophageal reflux disease), Hyperlipidemia, Hypertension, Hypothyroidism, MI (myocardial infarction) ([place]), Prostate cancer ([place]), and Vertigo.  PSH:    Past Surgical History:  Procedure Laterality Date    CATARACT EXTRACTION W/PHACO Left 07/23/2021   Procedure: CATARACT EXTRACTION PHACO AND INTRAOCULAR LENS PLACEMENT ([place]) LEFT TORIC  8.27 01:12.2 LENS [place] goniotomy;  Surgeon: Leandrew Koyanagi, MD;  Location: [place];  Service: Ophthalmology;  Laterality: Left;   CATARACT EXTRACTION W/PHACO Right 08/13/2021   Procedure: CATARACT EXTRACTION PHACO AND INTRAOCULAR LENS PLACEMENT ([place]) RIGHT TORIC LENS [place] dual blade goniotomy;  Surgeon: Leandrew Koyanagi, MD;  Location: [place];  Service: Ophthalmology;  Laterality: Right;  10.08 01:26.6   CHOLECYSTECTOMY     COLONOSCOPY     COLONOSCOPY WITH PROPOFOL N/A 12/16/2015   Procedure: COLONOSCOPY WITH PROPOFOL;  Surgeon: Manya Silvas, MD;  Location: [facility] ENDOSCOPY;  Service: Endoscopy;  Laterality: N/A;   INGUINAL HERNIA REPAIR     LAPAROTOMY N/A 12/02/2018   Procedure: EXPLORATORY LAPAROTOMY, LYSIS OF ADHESIONS, POSSIBLE SBO;  Surgeon: Jules Husbands, MD;  Location: [facility] ORS;  Service: General;  Laterality: N/A;   PROSTATECTOMY     XI ROBOTIC ASSISTED INGUINAL HERNIA REPAIR WITH MESH Left 06/18/2020   Procedure: XI ROBOTIC ASSISTED INGUINAL HERNIA REPAIR WITH MESH, possible bilateral;  Surgeon: Jules Husbands, MD;  Location: [facility] ORS;  Service: General;  Laterality: Left;    Current Outpatient Medications  Medication Sig Dispense Refill   Ascorbic Acid (VITAMIN C) 500 MG CAPS Take 500 mg by mouth daily.      aspirin EC 81 MG tablet Take 81 mg by mouth daily.      atenolol (TENORMIN) 50 MG tablet Take 0.5 tablet (25 mg) by mouth once daily  0   atorvastatin (LIPITOR) 10 MG tablet Take  10 mg by mouth daily.      isosorbide mononitrate (IMDUR) 30 MG 24 hr tablet Take 30 mg by mouth daily.     Omega 3-6-9 Fatty Acids (OMEGA 3-6-9 COMPLEX PO) Take 1 capsule by mouth daily.      OVER THE COUNTER MEDICATION Take 2 capsules by mouth daily. Multi Greens      SYNTHROID 75 MCG tablet Take 75 mcg by mouth daily before  breakfast.   0   VITAMIN D, CHOLECALCIFEROL, PO Take 1 tablet by mouth daily.      zinc gluconate 50 MG tablet Take 50 mg by mouth daily.      HYDROcodone-acetaminophen (NORCO/VICODIN) 5-325 MG tablet Take 1-2 tablets by mouth every 6 (six) hours as needed for moderate pain. (Patient not taking: No sig reported) 20 tablet 0   TRAVATAN Z 0.004 % SOLN ophthalmic solution Place 1 drop into both eyes at bedtime. (Patient not taking: Reported on 08/22/2021)  0   No current facility-administered medications for this visit.     Allergies:   Patient has no known allergies.   Social History:  The patient  reports that he has never smoked. He has never used smokeless tobacco. He reports that he does not drink alcohol and does not use drugs.   Family History:   family history is not on file.    Review of Systems: Review of Systems  Constitutional: Negative.   HENT: Negative.    Respiratory: Negative.    Cardiovascular: Negative.   Gastrointestinal: Negative.   Musculoskeletal: Negative.   Neurological: Negative.   Psychiatric/Behavioral: Negative.    All other systems reviewed and are negative.   PHYSICAL EXAM: VS:  BP (!) 150/68 (BP Location: Right Arm, Patient Position: Sitting, Cuff Size: Normal)   Pulse (!) 43   Ht 5\' 8"  (1.727 m)   Wt 138 lb 4 oz (62.7 kg)   SpO2 98%   BMI 21.02 kg/m  , BMI Body mass index is 21.02 kg/m. GEN: Well nourished, well developed, in no acute distress HEENT: normal Neck: no JVD, carotid bruits, or masses Cardiac: Regular, bradycardic no murmurs, rubs, or gallops,no edema  Respiratory:  clear to auscultation bilaterally, normal work of breathing GI: soft, nontender, nondistended, + BS MS: no deformity or atrophy Skin: warm and dry, no rash Neuro:  Strength and sensation are intact Psych: euthymic mood, full affect   Recent Labs: No results found for requested labs within last 8760 hours.    Lipid Panel Lab Results  Component Value Date    TRIG 124 12/06/2018      Wt Readings from Last 3 Encounters:  08/22/21 138 lb 4 oz (62.7 kg)  08/13/21 135 lb (61.2 kg)  07/23/21 134 lb (60.8 kg)       ASSESSMENT AND PLAN:  Problem List Items Addressed This Visit   None Visit Diagnoses     Bradycardia    -  Primary   Relevant Orders   EKG 12-Lead   Coronary artery disease of native artery of native heart with stable angina pectoris (HCC)       Relevant Medications   isosorbide mononitrate (IMDUR) 30 MG 24 hr tablet   Mixed hyperlipidemia       Relevant Medications   isosorbide mononitrate (IMDUR) 30 MG 24 hr tablet   Primary hypertension       Relevant Medications   isosorbide mononitrate (IMDUR) 30 MG 24 hr tablet      Coronary artery disease with stable angina  Reports having angioplasty, no stent placement many years ago " 30 years ago" Denies having anginal symptoms since that time Stress test 2021 with no ischemia as part of preoperative evaluation -Echocardiogram 2015 normal study -Medication changes as below, no further ischemic work-up needed at this time  Essential hypertension Currently on atenolol with isosorbide Does not check his blood pressure at home Blood pressure elevated today but feels it is from rushing into the office Recommend he decrease atenolol as detailed below, stay on isosorbide May need to increase isosorbide or add other agent for blood pressure if off atenolol.  Could start losartan  Marked bradycardia Noted to have heart rates in the 40s in 2021 Heart rate 43 on EKG today and on clinical exam Recommend he decrease atenolol down to 25 daily He will check heart rate with pulse oximeter, if it remains in the 40s we will need to decrease atenolol further and consider stopping the atenolol altogether He has follow-up with primary care in 2 weeks to discuss  Hyperlipidemia Cholesterol is at goal on the current lipid regimen. No changes to the medications were made.     Total  encounter time more than 60 minutes  Greater than 50% was spent in counseling and coordination of care with the patient    Signed, Esmond Plants, M.D., Ph.D. [place], [place]

## 2021-08-22 NOTE — Patient Instructions (Addendum)
Check heart rate with pulse-oximeter  Medication Instructions:  Please cut the atenolol down to 25 mg daily or 1/4 pill (12.5 mg daily) Monitor heart rate, Goal heart rate >50 at rest  If still low with Dr. Doy Hutching, we may need to stop the atenolol  If you need a refill on your cardiac medications before your next appointment, please call your pharmacy.    Lab work: No new labs needed   If you have labs (blood work) drawn today and your tests are completely normal, you will receive your results only by: [place] (if you have MyChart) OR A paper copy in the mail If you have any lab test that is abnormal or we need to change your treatment, we will call you to review the results.   Testing/Procedures: No new testing needed   Follow-Up: At [facility], you and your health needs are our priority.  As part of our continuing mission to provide you with exceptional heart care, we have created designated Provider Care Teams.  These Care Teams include your primary Cardiologist (physician) and Advanced Practice Providers (APPs -  Physician Assistants and Nurse Practitioners) who all work together to provide you with the care you need, when you need it.  You will need a follow up appointment in 12 months  Providers on your designated Care Team:   Murray Hodgkins, NP Christell Faith, PA-C Marrianne Mood, PA-C Cadence Kathlen Mody, [place]  Any Other Special Instructions Will Be Listed Below (If Applicable).  COVID-19 Vaccine Information can be found at: ShippingScam.co.uk For questions related to vaccine distribution or appointments, please email [email or web address] or call 2893680419.

## 2021-08-26 DIAGNOSIS — Z79899 Other long term (current) drug therapy: Secondary | ICD-10-CM | POA: Diagnosis not present

## 2021-08-26 DIAGNOSIS — E039 Hypothyroidism, unspecified: Secondary | ICD-10-CM | POA: Diagnosis not present

## 2021-08-26 DIAGNOSIS — I10 Essential (primary) hypertension: Secondary | ICD-10-CM | POA: Diagnosis not present

## 2021-08-26 DIAGNOSIS — E782 Mixed hyperlipidemia: Secondary | ICD-10-CM | POA: Diagnosis not present

## 2021-08-26 DIAGNOSIS — R7309 Other abnormal glucose: Secondary | ICD-10-CM | POA: Diagnosis not present

## 2021-09-02 DIAGNOSIS — Z125 Encounter for screening for malignant neoplasm of prostate: Secondary | ICD-10-CM | POA: Diagnosis not present

## 2021-09-02 DIAGNOSIS — E039 Hypothyroidism, unspecified: Secondary | ICD-10-CM | POA: Diagnosis not present

## 2021-09-02 DIAGNOSIS — Z8546 Personal history of malignant neoplasm of prostate: Secondary | ICD-10-CM | POA: Diagnosis not present

## 2021-09-02 DIAGNOSIS — I2510 Atherosclerotic heart disease of native coronary artery without angina pectoris: Secondary | ICD-10-CM | POA: Diagnosis not present

## 2021-09-02 DIAGNOSIS — I10 Essential (primary) hypertension: Secondary | ICD-10-CM | POA: Diagnosis not present

## 2021-09-02 DIAGNOSIS — E782 Mixed hyperlipidemia: Secondary | ICD-10-CM | POA: Diagnosis not present

## 2021-09-02 DIAGNOSIS — R7309 Other abnormal glucose: Secondary | ICD-10-CM | POA: Diagnosis not present

## 2021-09-02 DIAGNOSIS — Z79899 Other long term (current) drug therapy: Secondary | ICD-10-CM | POA: Diagnosis not present

## 2021-10-02 ENCOUNTER — Other Ambulatory Visit: Payer: Self-pay

## 2021-10-02 ENCOUNTER — Ambulatory Visit: Payer: PPO | Admitting: Dermatology

## 2021-10-02 DIAGNOSIS — L578 Other skin changes due to chronic exposure to nonionizing radiation: Secondary | ICD-10-CM | POA: Diagnosis not present

## 2021-10-02 DIAGNOSIS — L219 Seborrheic dermatitis, unspecified: Secondary | ICD-10-CM

## 2021-10-02 DIAGNOSIS — L570 Actinic keratosis: Secondary | ICD-10-CM | POA: Diagnosis not present

## 2021-10-02 DIAGNOSIS — L820 Inflamed seborrheic keratosis: Secondary | ICD-10-CM | POA: Diagnosis not present

## 2021-10-02 MED ORDER — HYDROCORTISONE 2.5 % EX CREA: TOPICAL_CREAM | CUTANEOUS | 3 refills | Status: DC

## 2021-10-02 MED ORDER — FLUOROURACIL 5 % EX CREA: TOPICAL_CREAM | Freq: Two times a day (BID) | CUTANEOUS | 2 refills | Status: DC

## 2021-10-02 NOTE — Progress Notes (Signed)
Follow-Up Visit   Subjective  Daniel Lewis is a 86 y.o. male who presents for the following: Actinic Keratosis (Face, 49m f/u).  He has several areas to be evaluated.  He has some scaliness between his eyebrows.  The following portions of the chart were reviewed this encounter and updated as appropriate:   Tobacco  Allergies  Meds  Problems  Med Hx  Surg Hx  Fam Hx     Review of Systems:  No other skin or systemic complaints except as noted in HPI or Assessment and Plan.  Objective  Well appearing patient in no apparent distress; mood and affect are within normal limits.  A focused examination was performed including face. Relevant physical exam findings are noted in the Assessment and Plan.  face 15 (15) Pink scaly macules   face x 3, Total = 3 (3) Erythematous keratotic or waxy stuck-on papule or plaque.   Head - Anterior (Face) Scaling glabella   Assessment & Plan   Actinic Damage - Severe, confluent actinic changes with pre-cancerous actinic keratoses  - Severe, chronic, not at goal, secondary to cumulative UV radiation exposure over time - diffuse scaly erythematous macules and papules with underlying dyspigmentation - Discussed Prescription "Field Treatment" for Severe, Chronic Confluent Actinic Changes with Pre-Cancerous Actinic Keratoses Field treatment involves treatment of an entire area of skin that has confluent Actinic Changes (Sun/ Ultraviolet light damage) and PreCancerous Actinic Keratoses by method of PhotoDynamic Therapy (PDT) and/or prescription Topical Chemotherapy agents such as 5-fluorouracil, 5-fluorouracil/calcipotriene, and/or imiquimod.  The purpose is to decrease the number of clinically evident and subclinical PreCancerous lesions to prevent progression to development of skin cancer by chemically destroying early precancer changes that may or may not be visible.  It has been shown to reduce the risk of developing skin cancer in the treated  area. As a result of treatment, redness, scaling, crusting, and open sores may occur during treatment course. One or more than one of these methods may be used and may have to be used several times to control, suppress and eliminate the PreCancerous changes. Discussed treatment course, expected reaction, and possible side effects. - Recommend daily broad spectrum sunscreen SPF 30+ to sun-exposed areas, reapply every 2 hours as needed.  - Staying in the shade or wearing long sleeves, sun glasses (UVA+UVB protection) and wide brim hats (4-inch brim around the entire circumference of the hat) are also recommended. - Call for new or changing lesions.  -- 11/24/21 Start 5-fluorouracil/calcipotriene cream twice a day for 7 days to affected areas including forehead and temples. Then on 12/29/21 use cream twice a day for 7 days to cheeks and nose.  Prescription sent to [facility]. Patient advised they will receive an email to purchase the medication online and have it sent to their home. Patient provided with handout reviewing treatment course and side effects and advised to call or message [place] on MyChart with any concerns.   AK (actinic keratosis) (15) face 15  Destruction of lesion - face 15 Complexity: simple   Destruction method: cryotherapy   Informed consent: discussed and consent obtained   Timeout:  patient name, date of birth, surgical site, and procedure verified Lesion destroyed using liquid nitrogen: Yes   Region frozen until ice ball extended beyond lesion: Yes   Outcome: patient tolerated procedure well with no complications   Post-procedure details: wound care instructions given    fluorouracil (EFUDEX) 5 % cream - face 15 Apply topically 2 (two)  times daily. Starting 11/24/21 apply thin layer to forehead and temples bid for 7 days, then on 12/29/21 apply thin layer to cheeks and nose bid for 7 days.  Inflamed seborrheic keratosis face x 3, Total =  3  Destruction of lesion - face x 3, Total = 3 Complexity: simple   Destruction method: cryotherapy   Informed consent: discussed and consent obtained   Timeout:  patient name, date of birth, surgical site, and procedure verified Lesion destroyed using liquid nitrogen: Yes   Region frozen until ice ball extended beyond lesion: Yes   Outcome: patient tolerated procedure well with no complications   Post-procedure details: wound care instructions given    Seborrheic dermatitis Head - Anterior (Face)  Seborrheic Dermatitis  -  is a chronic persistent rash characterized by pinkness and scaling most commonly of the mid face but also can occur on the scalp (dandruff), ears; mid chest, mid back and groin.  It tends to be exacerbated by stress and cooler weather.  People who have neurologic disease may experience new onset or exacerbation of existing seborrheic dermatitis.  The condition is not curable but treatable and can be controlled.  Start HC 2.5% cream 3d/wk  hydrocortisone 2.5 % cream - Head - Anterior (Face) Apply topically 3 (three) times a week. Apply to scaly area between eyes 3 nights weekly, Monday, Wednesday and Fridays  Return in about 6 months (around 04/01/2022) for AK f/u.  I, [name], RMA, am acting as scribe for Sarina Ser, MD . Documentation: I have reviewed the above documentation for accuracy and completeness, and I agree with the above.  Sarina Ser, MD

## 2021-10-02 NOTE — Patient Instructions (Addendum)
If you have any questions or concerns for your doctor, please call our main line at [phone] and press option 4 to reach your doctor's medical assistant. If no one answers, please leave a voicemail as directed and we will return your call as soon as possible. Messages left after 4 pm will be answered the following business day.   You may also send [place] a message via [place]. We typically respond to MyChart messages within 1-2 business days.  For prescription refills, please ask your pharmacy to contact our office. Our fax number is [phone].  If you have an urgent issue when the clinic is closed that cannot wait until the next business day, you can page your doctor at the number below.    Please note that while we do our best to be available for urgent issues outside of office hours, we are not available 24/7.   If you have an urgent issue and are unable to reach [place], you may choose to seek medical care at your doctor's office, retail clinic, urgent care center, or emergency room.  If you have a medical emergency, please immediately call 911 or go to the emergency department.  Pager Numbers  - Dr. Nehemiah Massed: [phone]  - Dr. Laurence Ferrari: [phone]  - Dr. Nicole Kindred: [phone]  In the event of inclement weather, please call our main line at [phone] for an update on the status of any delays or closures.  Dermatology Medication Tips: Please keep the boxes that topical medications come in in order to help keep track of the instructions about where and how to use these. Pharmacies typically print the medication instructions only on the boxes and not directly on the medication tubes.   If your medication is too expensive, please contact our office at [phone] option 4 or send [place] a message through [place].   We are unable to tell what your co-pay for medications will be in advance as this is different depending on your insurance coverage. However, we may be able to find a substitute  medication at lower cost or fill out paperwork to get insurance to cover a needed medication.   If a prior authorization is required to get your medication covered by your insurance company, please allow [place] 1-2 business days to complete this process.  Drug prices often vary depending on where the prescription is filled and some pharmacies may offer cheaper prices.  The website www.goodrx.com contains coupons for medications through different pharmacies. The prices here do not account for what the cost may be with help from insurance (it may be cheaper with your insurance), but the website can give you the price if you did not use any insurance.  - You can print the associated coupon and take it with your prescription to the pharmacy.  - You may also stop by our office during regular business hours and pick up a GoodRx coupon card.  - If you need your prescription sent electronically to a different pharmacy, notify our office through [facility] or by phone at [phone] option 4.  Cream for face 5-fluorouracil/Calcipotriene cream Starting 11/24/21 apply thin layer to forehead and temples 2 times a day for 7 days, then on 12/29/21 apply thin layer to cheeks and nose 2 times a day for 7 days.

## 2021-10-07 ENCOUNTER — Encounter: Payer: Self-pay | Admitting: Dermatology

## 2021-12-09 DIAGNOSIS — H401131 Primary open-angle glaucoma, bilateral, mild stage: Secondary | ICD-10-CM | POA: Diagnosis not present

## 2022-01-06 DIAGNOSIS — H900 Conductive hearing loss, bilateral: Secondary | ICD-10-CM | POA: Diagnosis not present

## 2022-01-06 DIAGNOSIS — H6123 Impacted cerumen, bilateral: Secondary | ICD-10-CM | POA: Diagnosis not present

## 2022-02-12 ENCOUNTER — Encounter: Payer: Self-pay | Admitting: Dermatology

## 2022-02-12 ENCOUNTER — Telehealth: Payer: Self-pay | Admitting: Cardiovascular Disease

## 2022-02-12 NOTE — Telephone Encounter (Signed)
Call transferred directly to this RN. ? ?I spoke with the patient's wife, Orbie Hurst. ?She advised that the patient had an episode of syncope in 09/2021 while standing at a funeral. ?She advised that they had not eaten much that day and the patient would have hit his head on the hard ground if not aided to ground by bystanders. ? ?Yesterday, the patient had practice for the [employer] (table tennis), then he went for a hair cut ~ 3 pm. ?While sitting in the chair for the haircut, he passed out.  ? ?I was able to speak with the patient on the phone as well. ?He advised that in November, he had been standing outside for a while at the funeral when he passed out. ?In regards to yesterday, he advised he had some diarrhea the night prior. ?He did drink some ginger ale and ate something prior to going to practice for the [employer] yesterday. ?He drink a little water after his practice and prior to his hair cut. ?He advised he had been sitting in the barber chair for ~ 20-30 minutes when he passed out yesterday. ?He advised he did not feel well, but was trying to let his barber finish. ?He states he started to see bright lights prior to passing out. ?His barber got him a cold towel and a bottle of water when he came to. ?He thinks he was out "moments." ? ?I have confirmed with the patient that he takes both atenolol and imdur in the mornings.  ?He checks his BP at home, but states his cuff is not accurate. 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 ? ?I advised the patient that his symptoms are most likely due to dehydration/ low blood pressure, but do feel like he should have an in office evaluation to discuss further.  ? ?I have offered a DOD spot today at 10:40 am, but the patient already had an appointment scheduled tomorrow morning with Cadence and prefers to keep that appointment.  ? ?I have advised the patient/ his wife of ER precautions for recurrent syncope, but if stable, we will  see in office tomorrow as planned.  ? ?The patient and his wife voice understanding and are agreeable. ?

## 2022-02-12 NOTE — Progress Notes (Signed)
?Cardiology Office Note:   ? ?Date:  02/13/2022  ? ?ID:  Daniel Lewis, DOB 06-22-1935, MRN [id number] ? ?PCP:  Idelle Crouch, MD  ?[facility] Cardiologist:  None  ?[place] Electrophysiologist:  None  ? ?Referring MD: Idelle Crouch, MD  ? ?Chief Complaint:syncope ? ?History of Present Illness:   ? ?Daniel Lewis is a 87 y.o. male with a hx of HTN, HLD, thyroid disease, CAD with remote angioplasty who presents for syncope.  ? ?H/o of bradycardia on BB. Echo in 2015 with preserved EF. Prior MPI July 2021 was low risk. ? ?Last seen 08/22/21 and was overall doing well. Heart rate was low on atenolol.  ? ?Today, the patient reports 2 syncopal episodes. Michela Pitcher the first was in November 2022. It was a hot day and had been standing for 45 minutes before he passed out. Didn't hydrate as he should have. Started feeling dizzy and lightheaded and fell. He was caught by a Marine scientist. Most recently however, he fell 2 days ago. Had diarrhea the night before. He went to play ping pong and went to get his hair cut. While in the chair he started feeling dizzy and lightheaded. Passed out for a couple seconds. He drank water and he felt better. No chest pain or shortness of breath, no palpitations prior to the episodes. Had some diaphoresis. He didn't want to see PCP after each episodes. Said BP was higher than normal after the episode, unsure what his heart rate was. Denies orthostatic symptoms. Today, HR is 49bpm he is on atenolol. He says heart rate is int he 60s at home, however heart rate last year was 43bpm.  ?Also reports cough, thought it was from GERD, however PPI is not helping.  ? ?Past Medical History:  ?Diagnosis Date  ? Actinic keratoses   ? Anemia   ? ASHD (arteriosclerotic heart disease)   ? Chronic kidney disease   ? Erectile dysfunction   ? GERD (gastroesophageal reflux disease)   ? Hyperlipidemia   ? Hypertension   ? Hypothyroidism   ? MI (myocardial infarction) ([place])   ? Prostate cancer ([place])   ?  prostate cancer  ? Vertigo   ? ? ?Past Surgical History:  ?Procedure Laterality Date  ? CATARACT EXTRACTION W/PHACO Left 07/23/2021  ? Procedure: CATARACT EXTRACTION PHACO AND INTRAOCULAR LENS PLACEMENT ([place]) LEFT TORIC  8.27 01:12.2 LENS [place] goniotomy;  Surgeon: Leandrew Koyanagi, MD;  Location: [place];  Service: Ophthalmology;  Laterality: Left;  ? CATARACT EXTRACTION W/PHACO Right 08/13/2021  ? Procedure: CATARACT EXTRACTION PHACO AND INTRAOCULAR LENS PLACEMENT ([place]) RIGHT TORIC LENS [place] dual blade goniotomy;  Surgeon: Leandrew Koyanagi, MD;  Location: [place];  Service: Ophthalmology;  Laterality: Right;  10.08 ?01:26.6  ? CHOLECYSTECTOMY    ? COLONOSCOPY    ? COLONOSCOPY WITH PROPOFOL N/A 12/16/2015  ? Procedure: COLONOSCOPY WITH PROPOFOL;  Surgeon: Manya Silvas, MD;  Location: [facility];  Service: Endoscopy;  Laterality: N/A;  ? INGUINAL HERNIA REPAIR    ? LAPAROTOMY N/A 12/02/2018  ? Procedure: EXPLORATORY LAPAROTOMY, LYSIS OF ADHESIONS, POSSIBLE SBO;  Surgeon: Jules Husbands, MD;  Location: [facility];  Service: General;  Laterality: N/A;  ? PROSTATECTOMY    ? XI ROBOTIC ASSISTED INGUINAL HERNIA REPAIR WITH MESH Left 06/18/2020  ? Procedure: XI ROBOTIC ASSISTED INGUINAL HERNIA REPAIR WITH MESH, possible bilateral;  Surgeon: Jules Husbands, MD;  Location: [facility];  Service: General;  Laterality: Left;  ? ? ?Current Medications: ?  Current Meds  ?Medication Sig  ? Ascorbic Acid (VITAMIN C) 500 MG CAPS Take 500 mg by mouth daily.   ? aspirin EC 81 MG tablet Take 81 mg by mouth daily.   ? atenolol (TENORMIN) 50 MG tablet Take 0.5 tablet (25 mg) by mouth once daily  ? atorvastatin (LIPITOR) 10 MG tablet Take 10 mg by mouth daily.   ? Omega 3-6-9 Fatty Acids (OMEGA 3-6-9 COMPLEX PO) Take 1 capsule by mouth daily.   ? OVER THE COUNTER MEDICATION Take 2 capsules by mouth daily. Multi Greens   ? SYNTHROID 75 MCG tablet Take 75 mcg by mouth daily before breakfast.   ? VITAMIN D,  CHOLECALCIFEROL, PO Take 1 tablet by mouth daily.   ? zinc gluconate 50 MG tablet Take 50 mg by mouth daily.   ? [DISCONTINUED] isosorbide mononitrate (IMDUR) 30 MG 24 hr tablet Take 30 mg by mouth daily.  ?  ? ?Allergies:   Patient has no known allergies.  ? ?Social History  ? ?Socioeconomic History  ? Marital status: Married  ?  Spouse name: Not on file  ? Number of children: Not on file  ? Years of education: Not on file  ? Highest education level: Not on file  ?Occupational History  ? Not on file  ?Tobacco Use  ? Smoking status: Never  ? Smokeless tobacco: Never  ?Substance and Sexual Activity  ? Alcohol use: No  ? Drug use: No  ? Sexual activity: Not on file  ?Other Topics Concern  ? Not on file  ?Social History Narrative  ? Not on file  ? ?Social Determinants of Health  ? ?Financial Resource Strain: Not on file  ?Food Insecurity: Not on file  ?Transportation Needs: Not on file  ?Physical Activity: Not on file  ?Stress: Not on file  ?Social Connections: Not on file  ?  ? ?Family History: ?The patient's family history is not on file. ? ?ROS:   ?Please see the history of present illness.    ? All other systems reviewed and are negative. ? ?EKGs/Labs/Other Studies Reviewed:   ? ?The following studies were reviewed today: ? ?MPI 05/2020 ?06/04/2020 4:30 PM EDT   ? ?1.  ETT ?2.  Normal left ventricular function ?3.  Normal wall motion ?4.  No evidence for scar or ischemia ? ?Echo 2015 ?Closest EF: >55% (Estimated)  ?Contraction: Normal  ?LVH: MILD LVH  ?Mitral: MILD MR  ?Tricuspid: MILD TR  ?Aortic: TRIVIAL AR  ?Size: MILDLY ENLARGED  ?Size: MILDLY ENLARGED  ?   ? ?EKG:  EKG is  ordered today.  The ekg ordered today demonstrates SB 1st degree AV block, 48bpm, LAD, LVH with repoal abnormalities, (QRS 176m), and nonspecific ST/T wave changes, no change from prior ? ?Recent Labs: ?No results found for requested labs within last 8760 hours.  ?Recent Lipid Panel ?   ?Component Value Date/Time  ? TRIG 124 12/06/2018 0538   ? ? ?Physical Exam:   ? ?VS:  BP (!) 165/81 (BP Location: Left Arm, Patient Position: Sitting, Cuff Size: Normal)   Pulse (!) 48   Ht '5\' 9"'$  (1.753 m)   Wt 140 lb (63.5 kg)   SpO2 98%   BMI 20.67 kg/m?    ? ?Wt Readings from Last 3 Encounters:  ?02/13/22 140 lb (63.5 kg)  ?08/22/21 138 lb 4 oz (62.7 kg)  ?08/13/21 135 lb (61.2 kg)  ?  ? ?GEN:  Well nourished, well developed in no acute distress ?HEENT: Normal ?  NECK: No JVD; No carotid bruits ?LYMPHATICS: No lymphadenopathy ?CARDIAC: bradycardia, RR, no murmurs, rubs, gallops ?RESPIRATORY:  Clear to auscultation without rales, wheezing or rhonchi  ?ABDOMEN: Soft, non-tender, non-distended ?MUSCULOSKELETAL:  No edema; No deformity  ?SKIN: Warm and dry ?NEUROLOGIC:  Alert and oriented x 3 ?PSYCHIATRIC:  Normal affect  ? ?ASSESSMENT:   ? ?1. Syncope and collapse   ?2. Bradycardia   ?3. Primary hypertension   ?4. Coronary artery disease involving native coronary artery of native heart without angina pectoris   ? ?PLAN:   ? ?In order of problems listed above: ? ?Syncope ?Syncopal episodes sounds vasovagal in nature, exacerbated by dehydration. Denies orthostatic symptoms. Orthostatic vitals today are negative. EKG shows sinus bradycardia with heart rate of 48bpm, he is on atenolol '25mg'$  daily and does not want to stop this. I will order BMET, Mag, TSH, CBC. I will order an echo and 2 week heart monitor. Recommended good hydration.  ? ?CAD with remote stenting ?Patient denies anginal symptoms, he is very active at baseline and walks daily. Prior MPI in 2021 was low risk with preserved EF. Continue Aspirin, statin, BB and Imdur.  ? ?HTN ?BP high today, he says it's generally better at home. I recommended stopping atenolol for low HR and trying another antihypertensive, but patient would like to wait at this time. He is also on Imdur '30mg'$  daily. I will not increase at this time given syncope episodes.  ? ?Bradycardia  ?Recommended stopping BB as above, but patient wants  to defer at this time.  ? ?Disposition: Follow up in 6-8 weeks week(s) with Md/APP  ? ? ? ? ?Signed, ?Malina Geers Ninfa Meeker, PA-C  ?02/13/2022 11:21 AM    ?[facility]  ?

## 2022-02-12 NOTE — Telephone Encounter (Signed)
Pt c/o Syncope: STAT if syncope occurred within 30 minutes and pt complains of lightheadedness ?High Priority if episode of passing out, completely, today or in last 24 hours  ? ?Did you pass out today? No   ? ?When is the last time you passed out? Yesterday afternoon at barber shop   ? ?Has this occurred multiple times? Yes September at a funeral   ? ?Did you have any symptoms prior to passing out? Vision - gets bright  ?Scheduled tomorrow  3-24 furth at 10:05 ? ?

## 2022-02-13 ENCOUNTER — Ambulatory Visit: Payer: PPO | Admitting: Medical

## 2022-02-13 ENCOUNTER — Ambulatory Visit (INDEPENDENT_AMBULATORY_CARE_PROVIDER_SITE_OTHER): Payer: PPO

## 2022-02-13 ENCOUNTER — Other Ambulatory Visit: Payer: Self-pay

## 2022-02-13 ENCOUNTER — Encounter: Payer: Self-pay | Admitting: Medical

## 2022-02-13 VITALS — BP 165/81 | HR 48 | Ht 69.0 in | Wt 140.0 lb

## 2022-02-13 DIAGNOSIS — I2510 Atherosclerotic heart disease of native coronary artery without angina pectoris: Secondary | ICD-10-CM

## 2022-02-13 DIAGNOSIS — R55 Syncope and collapse: Secondary | ICD-10-CM | POA: Diagnosis not present

## 2022-02-13 DIAGNOSIS — R001 Bradycardia, unspecified: Secondary | ICD-10-CM | POA: Diagnosis not present

## 2022-02-13 DIAGNOSIS — I10 Essential (primary) hypertension: Secondary | ICD-10-CM

## 2022-02-13 MED ORDER — ISOSORBIDE MONONITRATE ER 30 MG PO TB24: 30.0000 mg | ORAL_TABLET | Freq: Every day | ORAL | 0 refills | Status: DC

## 2022-02-13 NOTE — Patient Instructions (Signed)
Medication Instructions:  ?Your physician recommends that you continue on your current medications as directed. Please refer to the Current Medication list given to you today.  ? ?*If you need a refill on your cardiac medications before your next appointment, please call your pharmacy* ? ? ?Lab Work: ? ?Today: BMET, mag, TSH, CBC ? ?If you have labs (blood work) drawn today and your tests are completely normal, you will receive your results only by: ?MyChart Message (if you have MyChart) OR ?A paper copy in the mail ?If you have any lab test that is abnormal or we need to change your treatment, we will call you to review the results. ? ? ?Testing/Procedures: ? ?Your physician has requested that you have an echocardiogram AFTER you wear your monitor for TWO WEEKS. Echocardiography is a painless test that uses sound waves to create images of your heart. It provides your doctor with information about the size and shape of your heart and how well your heart?s chambers and valves are working. This procedure takes approximately one hour. There are no restrictions for this procedure.  ? ?Your provider has ordered a heart monitor to wear for 14 days. This will be mailed to your home with instructions on placement. Once you have finished the time frame requested, you will return monitor in box provided. ? ?  ? ? ?Follow-Up: ?At [facility], you and your health needs are our priority.  As part of our continuing mission to provide you with exceptional heart care, we have created designated Provider Care Teams.  These Care Teams include your primary Cardiologist (physician) and Advanced Practice Providers (APPs -  Physician Assistants and Nurse Practitioners) who all work together to provide you with the care you need, when you need it. ? ?We recommend signing up for the patient portal called "MyChart".  Sign up information is provided on this After Visit Summary.  MyChart is used to connect with patients for Virtual Visits  (Telemedicine).  Patients are able to view lab/test results, encounter notes, upcoming appointments, etc.  Non-urgent messages can be sent to your provider as well.   ?To learn more about what you can do with MyChart, go to [email or web address].   ? ?Your next appointment:   ?6-8 week(s) ? ?The format for your next appointment:   ?In Person ? ?Provider:   ?You may see Nelva Bush, MD or one of the following Advanced Practice Providers on your designated Care Team:   ?Murray Hodgkins, NP ?Christell Faith, PA-C ?Cadence Kathlen Mody, PA-C ? ? ?Other Instructions ?N/A ?

## 2022-02-14 LAB — BASIC METABOLIC PANEL
BUN/Creatinine Ratio: 21 (ref 10–24)
BUN: 27 mg/dL (ref 8–27)
CO2: 26 mmol/L (ref 20–29)
Calcium: 9.4 mg/dL (ref 8.6–10.2)
Chloride: 103 mmol/L (ref 96–106)
Creatinine, Ser: 1.26 mg/dL (ref 0.76–1.27)
Glucose: 106 mg/dL — ABNORMAL HIGH (ref 70–99)
Potassium: 4.7 mmol/L (ref 3.5–5.2)
Sodium: 141 mmol/L (ref 134–144)
eGFR: 56 mL/min/{1.73_m2} — ABNORMAL LOW (ref >59)

## 2022-02-14 LAB — CBC
Hematocrit: 37.7 % (ref 37.5–51.0)
Hemoglobin: 12.8 g/dL — ABNORMAL LOW (ref 13.0–17.7)
MCH: 32.4 pg (ref 26.6–33.0)
MCHC: 34 g/dL (ref 31.5–35.7)
MCV: 95 fL (ref 79–97)
Platelets: 197 10*3/uL (ref 150–450)
RBC: 3.95 x10E6/uL — ABNORMAL LOW (ref 4.14–5.80)
RDW: 12 % (ref 11.6–15.4)
WBC: 8.9 10*3/uL (ref 3.4–10.8)

## 2022-02-14 LAB — MAGNESIUM: Magnesium: 2 mg/dL (ref 1.6–2.3)

## 2022-02-14 LAB — TSH: TSH: 1.19 u[IU]/mL (ref 0.450–4.500)

## 2022-02-16 DIAGNOSIS — R001 Bradycardia, unspecified: Secondary | ICD-10-CM

## 2022-02-16 DIAGNOSIS — R55 Syncope and collapse: Secondary | ICD-10-CM | POA: Diagnosis not present

## 2022-02-17 ENCOUNTER — Telehealth: Payer: Self-pay | Admitting: Cardiovascular Disease

## 2022-02-17 NOTE — Telephone Encounter (Signed)
Pt c/o BP issue: STAT if pt c/o blurred vision, one-sided weakness or slurred speech  1. What are your last 5 BP readings?  This morning 182/92 Yesterday morning 190/81 Sunday 177/89  2. Are you having any other symptoms (ex. Dizziness, headache, blurred vision, passed out)? No other symptoms.   3. What is your BP issue? Elevated, all these readings are before medication

## 2022-02-17 NOTE — Telephone Encounter (Signed)
Called and spoke with pt's wife Daniel Lewis, per DPR.  ? ?Daniel Lewis reports that pt has been having high blood pressures in the morning before taking his medication, but his BP is fine in the afternoon. ? ?States that Dr. Rockey Situ wanted to change his medication at his last visit because of his HR, but they declined. She stated that they now would like to change his BP medication because they don't feel that it's adequately controlling his BP. Pt currently on atenolol  ? ?Educated Daniel Lewis that they should be checking his BP 2 hours after giving him his medication. Daniel Lewis verbalized understanding.  ? ?Will forward message to MD to see what medication he would like to switch pt to. Told Daniel Lewis that in the meantime pt should remain on Atenolol and that BPs in the morning should be checked two hours AFTER medication. Daniel Lewis verbalized understanding and voiced appreciation for the call.  ?

## 2022-02-23 MED ORDER — LOSARTAN POTASSIUM 50 MG PO TABS: 50.0000 mg | ORAL_TABLET | Freq: Every morning | ORAL | 3 refills | Status: DC

## 2022-02-23 NOTE — Telephone Encounter (Signed)
Received following inbasket: ? ?"Would stop atenolol given bradycardia  ?Start losartan 50 daily in the morning  ?Imdur 30 in the evening  ?Check blood pressure late morning  ?If still elevated, may need to increase losartan up to 100 or Imdur up to 30 twice a day  ?Thx  ?TGollan" ? ?Called and spoke with pt's wife, per DPR. Went over recommendations. Pt's wife verbalized understanding. Pt will:  ? ?- stop atenolol  ?- start losartan 50 mg daily  ?- check BP late morning, at least 2 hours after morning meds ? ?Wife voiced appreciation for the call.   ?

## 2022-02-23 NOTE — Addendum Note (Signed)
Addended by: Mila Merry on: 02/23/2022 01:23 PM ? ? Modules accepted: Orders ? ?

## 2022-02-26 DIAGNOSIS — E782 Mixed hyperlipidemia: Secondary | ICD-10-CM | POA: Diagnosis not present

## 2022-02-26 DIAGNOSIS — E039 Hypothyroidism, unspecified: Secondary | ICD-10-CM | POA: Diagnosis not present

## 2022-02-26 DIAGNOSIS — Z125 Encounter for screening for malignant neoplasm of prostate: Secondary | ICD-10-CM | POA: Diagnosis not present

## 2022-02-26 DIAGNOSIS — Z79899 Other long term (current) drug therapy: Secondary | ICD-10-CM | POA: Diagnosis not present

## 2022-02-26 DIAGNOSIS — I10 Essential (primary) hypertension: Secondary | ICD-10-CM | POA: Diagnosis not present

## 2022-02-26 DIAGNOSIS — R8290 Unspecified abnormal findings in urine: Secondary | ICD-10-CM | POA: Diagnosis not present

## 2022-02-26 DIAGNOSIS — R7309 Other abnormal glucose: Secondary | ICD-10-CM | POA: Diagnosis not present

## 2022-03-05 ENCOUNTER — Other Ambulatory Visit: Payer: Self-pay | Admitting: Internal Medicine

## 2022-03-05 DIAGNOSIS — Z0000 Encounter for general adult medical examination without abnormal findings: Secondary | ICD-10-CM | POA: Diagnosis not present

## 2022-03-05 DIAGNOSIS — I10 Essential (primary) hypertension: Secondary | ICD-10-CM | POA: Diagnosis not present

## 2022-03-05 DIAGNOSIS — I700 Atherosclerosis of aorta: Secondary | ICD-10-CM | POA: Diagnosis not present

## 2022-03-05 DIAGNOSIS — E039 Hypothyroidism, unspecified: Secondary | ICD-10-CM | POA: Diagnosis not present

## 2022-03-05 DIAGNOSIS — R7309 Other abnormal glucose: Secondary | ICD-10-CM | POA: Diagnosis not present

## 2022-03-05 DIAGNOSIS — R55 Syncope and collapse: Secondary | ICD-10-CM | POA: Diagnosis not present

## 2022-03-05 DIAGNOSIS — I25118 Atherosclerotic heart disease of native coronary artery with other forms of angina pectoris: Secondary | ICD-10-CM | POA: Diagnosis not present

## 2022-03-05 DIAGNOSIS — E782 Mixed hyperlipidemia: Secondary | ICD-10-CM | POA: Diagnosis not present

## 2022-03-05 DIAGNOSIS — Z79899 Other long term (current) drug therapy: Secondary | ICD-10-CM | POA: Diagnosis not present

## 2022-03-05 DIAGNOSIS — I2510 Atherosclerotic heart disease of native coronary artery without angina pectoris: Secondary | ICD-10-CM | POA: Diagnosis not present

## 2022-03-05 DIAGNOSIS — Z8546 Personal history of malignant neoplasm of prostate: Secondary | ICD-10-CM | POA: Diagnosis not present

## 2022-03-09 DIAGNOSIS — R001 Bradycardia, unspecified: Secondary | ICD-10-CM | POA: Diagnosis not present

## 2022-03-09 DIAGNOSIS — R55 Syncope and collapse: Secondary | ICD-10-CM | POA: Diagnosis not present

## 2022-03-11 ENCOUNTER — Telehealth: Payer: Self-pay

## 2022-03-11 ENCOUNTER — Ambulatory Visit (INDEPENDENT_AMBULATORY_CARE_PROVIDER_SITE_OTHER): Payer: PPO

## 2022-03-11 DIAGNOSIS — R55 Syncope and collapse: Secondary | ICD-10-CM | POA: Diagnosis not present

## 2022-03-11 LAB — ECHOCARDIOGRAM COMPLETE
AR max vel: 1.89 cm2
AV Area VTI: 2.05 cm2
AV Area mean vel: 1.88 cm2
AV Mean grad: 6 mmHg
AV Peak grad: 11.4 mmHg
AV Vena cont: 0.4 cm
Ao pk vel: 1.69 m/s
Area-P 1/2: 2.42 cm2
Calc EF: 65.1 %
P 1/2 time: 609 msec
S' Lateral: 2.4 cm
Single Plane A2C EF: 65.1 %
Single Plane A4C EF: 65.2 %

## 2022-03-11 NOTE — Telephone Encounter (Signed)
Patients wife made aware of monitor results with verbalized understanding. ?

## 2022-03-11 NOTE — Telephone Encounter (Addendum)
Called to give the patient monitor results lmtcb ?

## 2022-03-11 NOTE — Telephone Encounter (Signed)
Patient's wife returned call. Transferred to RN. ?

## 2022-03-11 NOTE — Telephone Encounter (Signed)
-----   Message from [place], PA-C sent at 03/10/2022  1:11 PM EDT ----- ?Heart monitor showed predominately normal rhythm with some fast heart beats, longest 14.6 seconds. NO triggered events. Pt is on a BB already I believe. Do not suspect this is etiology for syncope.  ?

## 2022-03-12 ENCOUNTER — Telehealth: Payer: Self-pay

## 2022-03-12 NOTE — Telephone Encounter (Signed)
-----   Message from [place], PA-C sent at 03/11/2022  7:12 PM EDT ----- ?Echo was overall reassuring with normal pump function, mild impaired relaxation, normal RV function ?

## 2022-03-12 NOTE — Telephone Encounter (Signed)
Called to give the patient echo results. Lmtcb. 

## 2022-03-18 ENCOUNTER — Ambulatory Visit
Admission: RE | Admit: 2022-03-18 | Discharge: 2022-03-18 | Disposition: A | Discharge status: Home / Self Care | Payer: PPO | Source: Ambulatory Visit | Attending: Internal Medicine | Admitting: Internal Medicine

## 2022-03-18 DIAGNOSIS — R55 Syncope and collapse: Secondary | ICD-10-CM | POA: Diagnosis not present

## 2022-03-20 NOTE — Telephone Encounter (Signed)
03/12/2022 10:13 AM EDT   ?  ?DPR on file. Patients wife made aware of echo results with verbalized understanding.  ? ?

## 2022-03-24 DIAGNOSIS — R399 Unspecified symptoms and signs involving the genitourinary system: Secondary | ICD-10-CM | POA: Diagnosis not present

## 2022-03-25 NOTE — Progress Notes (Signed)
Cardiology Office Note ? ?Date:  03/27/2022  ? ?ID:  Daniel Lewis, DOB 06/25/1935, MRN [id number] ? ?PCP:  Idelle Crouch, MD  ? ?Chief Complaint  ?Patient presents with  ? 6-8 week follow up   ?  "Doing well." Medications reviewed by the patient verbally.   ? ? ?HPI:  ?Daniel Lewis is a 87 year old gentleman with past medical history of ?Hypertension ?Hyperlipidemia ?Thyroid disease ? coronary artery disease with angioplasty over 10 years ago ?Who presents for follow-up of his coronary artery disease coronary disease ? ?Last seen in clinic by myself September 2022 ?Patient discussion concerning his 2 syncopal episodes over the past several months. ? first was in November 2022. ?hot day and had been standing for 45 minutes before he passed out. Didn't hydrate as he should have. Started feeling dizzy and lightheaded and fell. He was caught by a [employer] scientist.  ? ? fell 1 month ago ? had diarrhea the night before. He went to play ping pong and went to get his hair cut. While in the chair he started feeling dizzy and lightheaded. Passed out for a couple seconds. He drank water and he felt better. ? ?NOW Off atenolol for bradycardia ?BP ok, tolerating losartan and isosorbide ?Has good energy ?Walks the dogs, active outside with his garden ? ?Lab work reviewed ?A1C 6.6 ?LDL 85 ?Reports he we will cut a Lipitor 80 mg pill into 8 pieces and takes 10 mg ? ?EKG personally reviewed by myself on todays visit ?Normal sinus rhythm rate 62 bpm LVH axis deviation no significant ST-T wave changes ? ? ?Other past medical history reviewed ?Stress test July 2021 ?Myoview, low risk study ? ?Echocardiogram 2015 ?Ejection fraction greater than 55% ? ?Total chol 127, LDL 66 in 01/2021 ? ?Cath: ? 50 to 75% stenosis proximal LAD.  ? ? ?PMH:   has a past medical history of Actinic keratoses, Anemia, ASHD (arteriosclerotic heart disease), Chronic kidney disease, Erectile dysfunction, GERD (gastroesophageal reflux disease), Hyperlipidemia,  Hypertension, Hypothyroidism, MI (myocardial infarction) ([place]), Prostate cancer ([place]), and Vertigo. ? ?PSH:    ?Past Surgical History:  ?Procedure Laterality Date  ? CATARACT EXTRACTION W/PHACO Left 07/23/2021  ? Procedure: CATARACT EXTRACTION PHACO AND INTRAOCULAR LENS PLACEMENT ([place]) LEFT TORIC  8.27 01:12.2 LENS [place] goniotomy;  Surgeon: Leandrew Koyanagi, MD;  Location: [place];  Service: Ophthalmology;  Laterality: Left;  ? CATARACT EXTRACTION W/PHACO Right 08/13/2021  ? Procedure: CATARACT EXTRACTION PHACO AND INTRAOCULAR LENS PLACEMENT ([place]) RIGHT TORIC LENS [place] dual blade goniotomy;  Surgeon: Leandrew Koyanagi, MD;  Location: [place];  Service: Ophthalmology;  Laterality: Right;  10.08 ?01:26.6  ? CHOLECYSTECTOMY    ? COLONOSCOPY    ? COLONOSCOPY WITH PROPOFOL N/A 12/16/2015  ? Procedure: COLONOSCOPY WITH PROPOFOL;  Surgeon: Manya Silvas, MD;  Location: [facility];  Service: Endoscopy;  Laterality: N/A;  ? INGUINAL HERNIA REPAIR    ? LAPAROTOMY N/A 12/02/2018  ? Procedure: EXPLORATORY LAPAROTOMY, LYSIS OF ADHESIONS, POSSIBLE SBO;  Surgeon: Jules Husbands, MD;  Location: [facility];  Service: General;  Laterality: N/A;  ? PROSTATECTOMY    ? XI ROBOTIC ASSISTED INGUINAL HERNIA REPAIR WITH MESH Left 06/18/2020  ? Procedure: XI ROBOTIC ASSISTED INGUINAL HERNIA REPAIR WITH MESH, possible bilateral;  Surgeon: Jules Husbands, MD;  Location: [facility];  Service: General;  Laterality: Left;  ? ? ?Current Outpatient Medications  ?Medication Sig Dispense Refill  ? Ascorbic Acid (VITAMIN C) 500 MG CAPS Take 500 mg by mouth  daily.     ? aspirin EC 81 MG tablet Take 81 mg by mouth daily.     ? atorvastatin (LIPITOR) 10 MG tablet Take 10 mg by mouth daily.     ? esomeprazole (NEXIUM) 40 MG capsule Take 40 mg by mouth daily.    ? isosorbide mononitrate (IMDUR) 30 MG 24 hr tablet Take 1 tablet (30 mg total) by mouth daily. 90 tablet 0  ? losartan (COZAAR) 50 MG tablet Take 1 tablet (50 mg  total) by mouth every morning. 30 tablet 3  ? Omega 3-6-9 Fatty Acids (OMEGA 3-6-9 COMPLEX PO) Take 1 capsule by mouth daily.     ? OVER THE COUNTER MEDICATION Take 2 capsules by mouth daily. Multi Greens     ? SYNTHROID 75 MCG tablet Take 75 mcg by mouth daily before breakfast.   0  ? VITAMIN D, CHOLECALCIFEROL, PO Take 1 tablet by mouth daily.     ? zinc gluconate 50 MG tablet Take 50 mg by mouth daily.     ? ?No current facility-administered medications for this visit.  ? ? ? ?Allergies:   Patient has no known allergies.  ? ?Social History:  The patient  reports that he has never smoked. He has never used smokeless tobacco. He reports that he does not drink alcohol and does not use drugs.  ? ?Family History:   family history is not on file.  ? ? ?Review of Systems: ?Review of Systems  ?Constitutional: Negative.   ?HENT: Negative.    ?Respiratory: Negative.    ?Cardiovascular: Negative.   ?Gastrointestinal: Negative.   ?Musculoskeletal: Negative.   ?Neurological: Negative.   ?Psychiatric/Behavioral: Negative.    ?All other systems reviewed and are negative. ? ? ?PHYSICAL EXAM: ?VS:  BP (!) 142/70 (BP Location: Left Arm, Patient Position: Sitting, Cuff Size: Normal)   Pulse 61   Ht '5\' 8"'$  (1.727 m)   Wt 139 lb (63 kg)   BMI 21.13 kg/m?  , BMI Body mass index is 21.13 kg/m?[place] Kitchen ?Constitutional:  oriented to person, place, and time. No distress.  ?HENT:  ?Head: Grossly normal ?Eyes:  no discharge. No scleral icterus.  ?Neck: No JVD, no carotid bruits  ?Cardiovascular: Regular rate and rhythm, no murmurs appreciated ?Pulmonary/Chest: Clear to auscultation bilaterally, no wheezes or rails ?Abdominal: Soft.  no distension.  no tenderness.  ?Musculoskeletal: Normal range of motion ?Neurological:  normal muscle tone. Coordination normal. No atrophy ?Skin: Skin warm and dry ?Psychiatric: normal affect, pleasant ? ? ?Recent Labs: ?02/13/2022: BUN 27; Creatinine, Ser 1.26; Hemoglobin 12.8; Magnesium 2.0; Platelets 197;  Potassium 4.7; Sodium 141; TSH 1.190  ? ? ?Lipid Panel ?Lab Results  ?Component Value Date  ? TRIG 124 12/06/2018  ? ?  ? ?Wt Readings from Last 3 Encounters:  ?03/27/22 139 lb (63 kg)  ?02/13/22 140 lb (63.5 kg)  ?08/22/21 138 lb 4 oz (62.7 kg)  ?  ? ?ASSESSMENT AND PLAN: ? ?Problem List Items Addressed This Visit   ?None ?Visit Diagnoses   ? ? Coronary artery disease of native artery of native heart with stable angina pectoris ([place])    -  Primary  ? Primary hypertension      ? Mixed hyperlipidemia      ? ?  ?Coronary artery disease with stable angina ?Reports having angioplasty, no stent placement many years ago " 30 years ago" ?Denies having anginal symptoms since that time ?Stress test 2021 with no ischemia as part of preoperative evaluation ?-Echocardiogram 2015 normal  study ?Recommend increase Lipitor to 20 mg daily ?Goal LDL less than 70 currently above goal ?Drinking lots of fruit juice, recommended water given elevated A1c ? ?Essential hypertension ?Blood pressure is well controlled on today's visit. No changes made to the medications. ? ?Marked bradycardia ?Heart rate better off atenolol ? ?Hyperlipidemia ?Rec Lipitor up to 20 daily ?Reports he is cutting 80 mg pill into 8 pieces takes 10 mg ? ? Total encounter time more than 30 minutes ? Greater than 50% was spent in counseling and coordination of care with the patient ? ? ? ?Signed, ?Esmond Plants, M.D., Ph.D. ?[facility] [place], [place] ?[phone] ? ?

## 2022-03-27 ENCOUNTER — Ambulatory Visit: Payer: PPO | Admitting: Cardiovascular Disease

## 2022-03-27 ENCOUNTER — Encounter: Payer: Self-pay | Admitting: Cardiovascular Disease

## 2022-03-27 VITALS — BP 142/70 | HR 61 | Ht 68.0 in | Wt 139.0 lb

## 2022-03-27 DIAGNOSIS — E782 Mixed hyperlipidemia: Secondary | ICD-10-CM | POA: Diagnosis not present

## 2022-03-27 DIAGNOSIS — I10 Essential (primary) hypertension: Secondary | ICD-10-CM | POA: Diagnosis not present

## 2022-03-27 DIAGNOSIS — I25118 Atherosclerotic heart disease of native coronary artery with other forms of angina pectoris: Secondary | ICD-10-CM | POA: Diagnosis not present

## 2022-03-27 MED ORDER — ATORVASTATIN CALCIUM 20 MG PO TABS: 20.0000 mg | ORAL_TABLET | Freq: Every day | ORAL | 3 refills | Status: DC

## 2022-03-27 NOTE — Patient Instructions (Signed)
Medication Instructions:  ?Please increase the atorvastatin up to 20 mg daily ? ?If you need a refill on your cardiac medications before your next appointment, please call your pharmacy.  ? ?Lab work: ?No new labs needed ? ?Testing/Procedures: ?No new testing needed ? ?Follow-Up: ?At [facility], you and your health needs are our priority.  As part of our continuing mission to provide you with exceptional heart care, we have created designated Provider Care Teams.  These Care Teams include your primary Cardiologist (physician) and Advanced Practice Providers (APPs -  Physician Assistants and Nurse Practitioners) who all work together to provide you with the care you need, when you need it. ? ?You will need a follow up appointment in 12 months ? ?Providers on your designated Care Team:   ?Murray Hodgkins, NP ?Christell Faith, PA-C ?Cadence Kathlen Mody, PA-C ? ?COVID-19 Vaccine Information can be found at: [email or web address] For questions related to vaccine distribution or appointments, please email [email or web address] or call [phone].  ? ?

## 2022-04-02 ENCOUNTER — Ambulatory Visit: Payer: PPO | Admitting: Dermatology

## 2022-04-04 IMAGING — CT CT CHEST W/O CM
2 of 4 series · 15 of 36 positions shown, 18 images · non-contrast
Comparison: None.

CLINICAL DATA: Lung nodule follow-up

EXAM:
CT CHEST WITHOUT CONTRAST
TECHNIQUE: Multidetector CT imaging of the chest was performed following the
standard protocol without IV contrast.

[Series 2: chest 2.00 · axial · 0.71mm/px · z∈[-1233,-909]mm · 12 of 192 slices shown, 15 images]
[im 15/192  mediastinal]
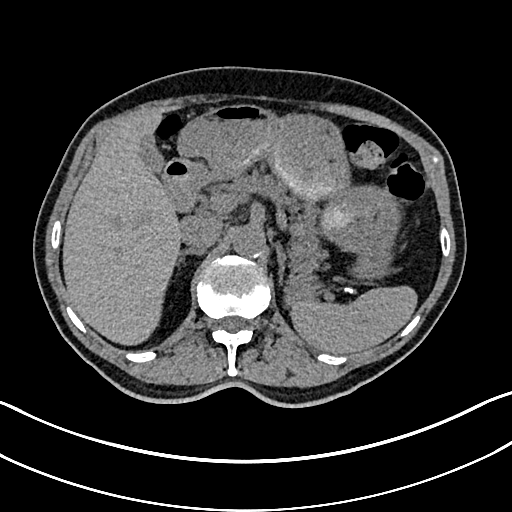
[im 15/192  lung]
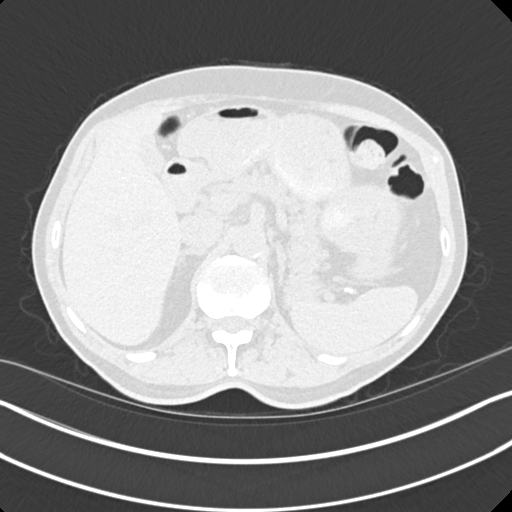
[im 30/192  lung]
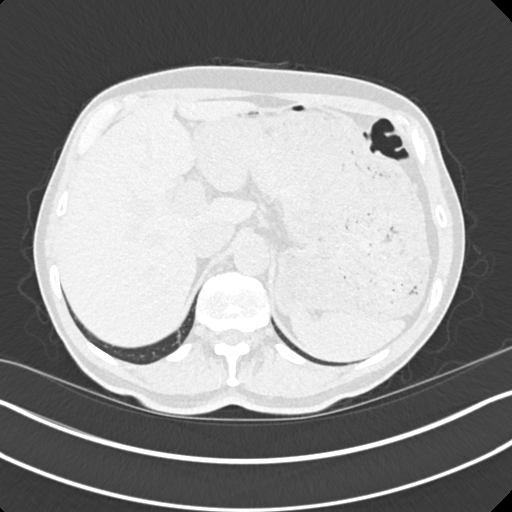
[im 45/192  lung]
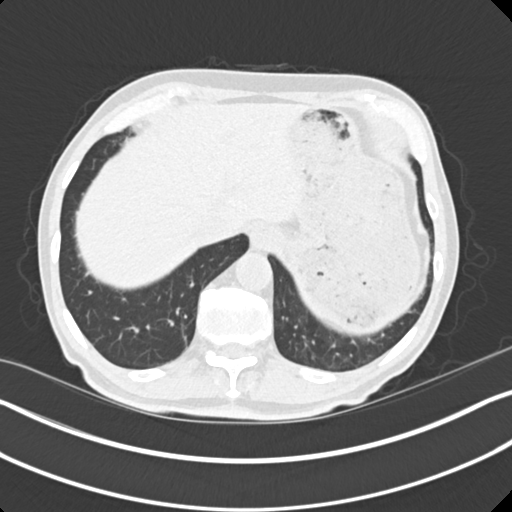
[im 59/192  lung]
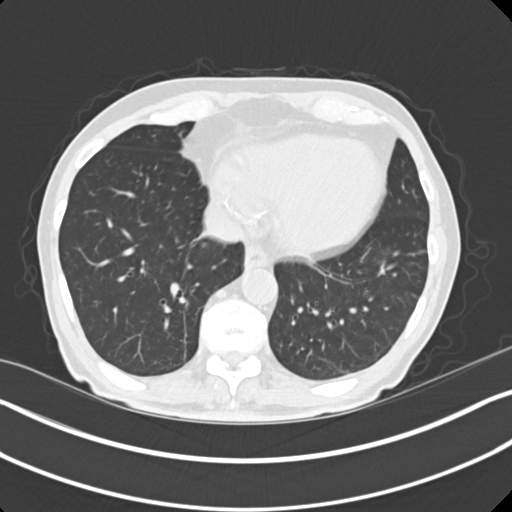
[im 74/192  mediastinal]
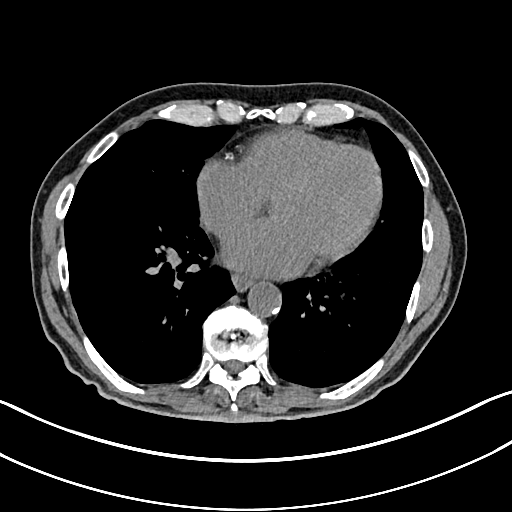
[im 74/192  lung]
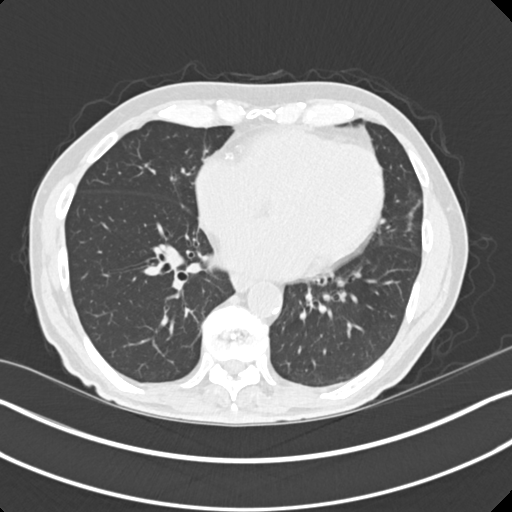
[im 89/192  lung]
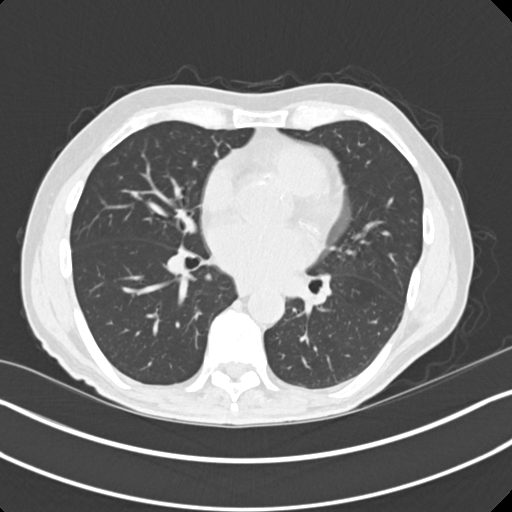
[im 103/192  lung]
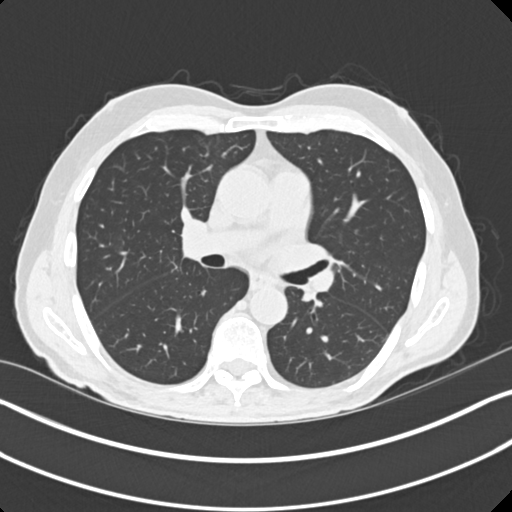
[im 118/192  lung]
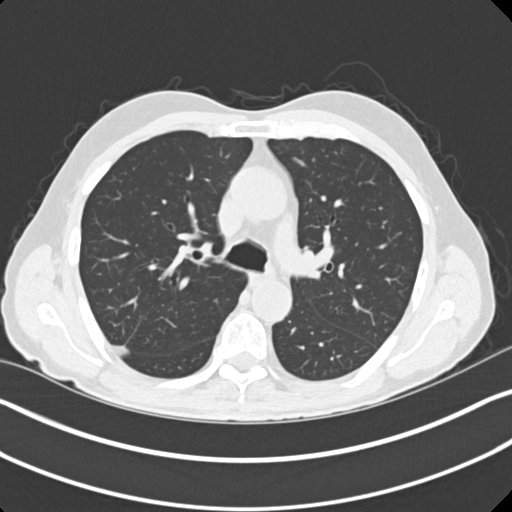
[im 133/192  mediastinal]
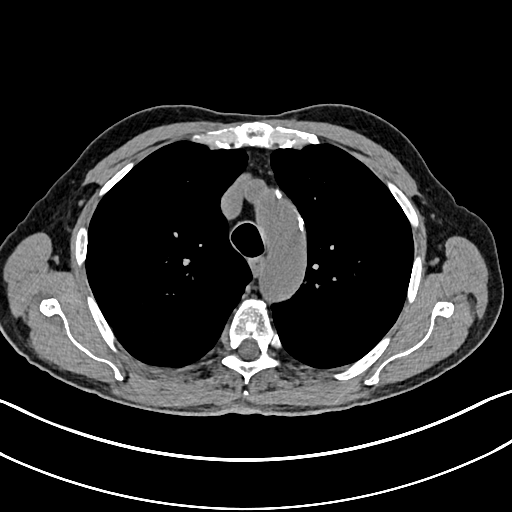
[im 133/192  lung]
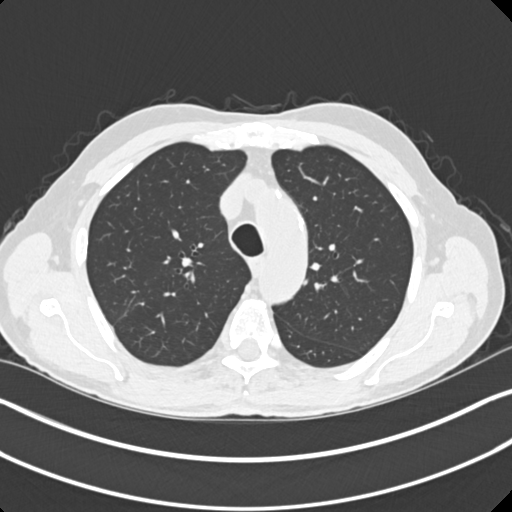
[im 147/192  lung]
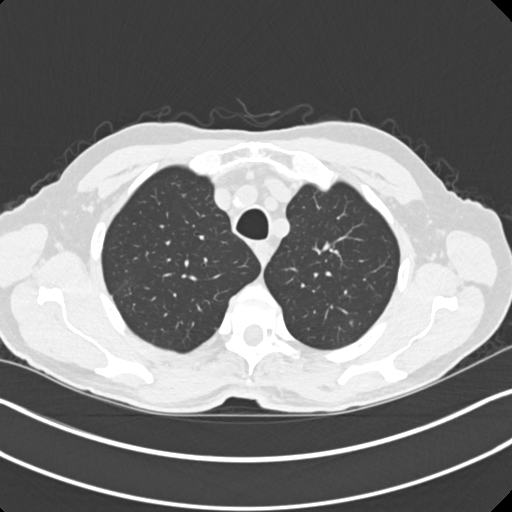
[im 162/192  lung]
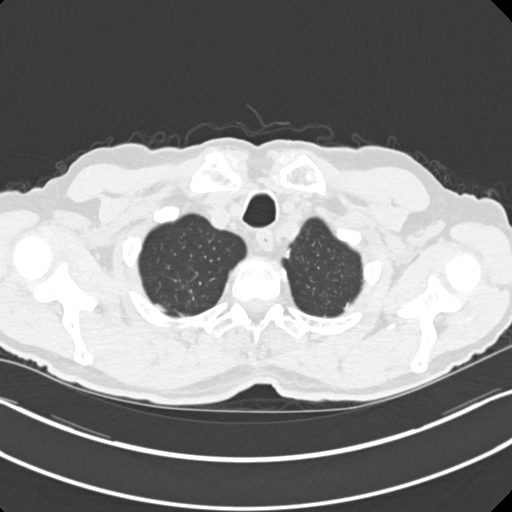
[im 177/192  lung]
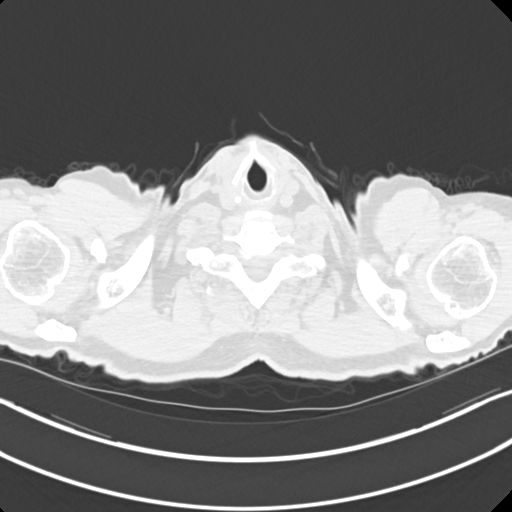

[Series 5: coronals chest 2.00 cor · coronal · 0.71mm/px · 3 of 154 slices shown]
[im 31/154  lung]
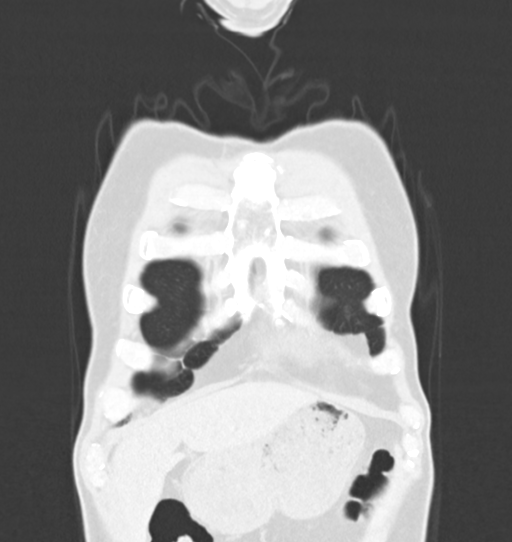
[im 62/154  lung]
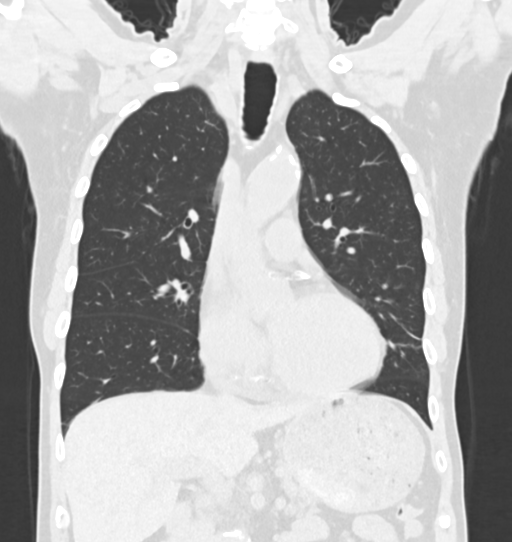
[im 92/154  lung]
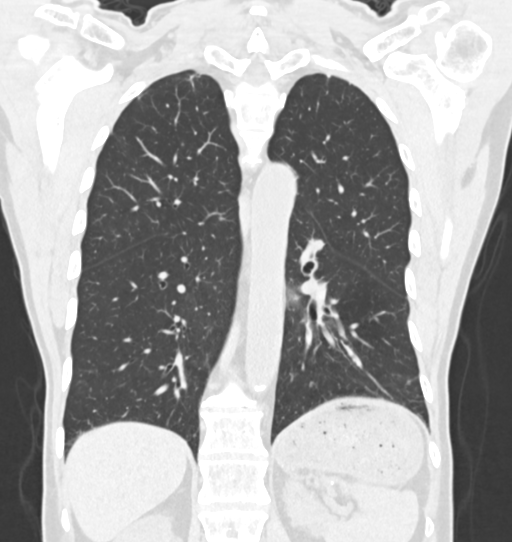

[15 of 36 positions shown; findings below may reference images not displayed]

FINDINGS: Cardiovascular: Normal heart size. No pericardial effusion. Coronary
artery calcification. Thoracic aorta is normal in caliber with mild
calcified plaque. Aortic valve calcification.

Mediastinum/Nodes: There are no enlarged lymph nodes identified.
Thyroid is unremarkable.

Lungs/Pleura: Few scattered small, mostly subpleural nodules are
again identified. The largest is again in the right lower lobe and
measures similar in size at 8 x 4 mm (series 3, image 96). No new
nodule. No pleural effusion.

Upper Abdomen: No acute abnormality.

Musculoskeletal: Stable thoracic spine degenerative changes. No
acute osseous abnormality.
IMPRESSION: Stable small lung nodules, with largest measuring 6 mm (mean
diameter). No new nodule. Follow-up CT in 12-18 months is considered
optional for low risk patients but is recommended for high-risk
patients.

## 2022-04-08 ENCOUNTER — Ambulatory Visit: Payer: PPO | Admitting: Dermatology

## 2022-04-08 DIAGNOSIS — L578 Other skin changes due to chronic exposure to nonionizing radiation: Secondary | ICD-10-CM | POA: Diagnosis not present

## 2022-04-08 DIAGNOSIS — L570 Actinic keratosis: Secondary | ICD-10-CM | POA: Diagnosis not present

## 2022-04-08 DIAGNOSIS — L820 Inflamed seborrheic keratosis: Secondary | ICD-10-CM | POA: Diagnosis not present

## 2022-04-08 DIAGNOSIS — L821 Other seborrheic keratosis: Secondary | ICD-10-CM

## 2022-04-08 NOTE — Progress Notes (Signed)
   Follow-Up Visit   Subjective  Daniel Lewis is a 86 y.o. male who presents for the following: Actinic Keratosis (Check for new or persistent precancerous skin lesions on the face today). The patient has spots, moles and lesions to be evaluated, some may be new or changing and the patient has concerns that these could be cancer.  The following portions of the chart were reviewed this encounter and updated as appropriate:   Tobacco  Allergies  Meds  Problems  Med Hx  Surg Hx  Fam Hx     Review of Systems:  No other skin or systemic complaints except as noted in HPI or Assessment and Plan.  Objective  Well appearing patient in no apparent distress; mood and affect are within normal limits.  A focused examination was performed including the face. Relevant physical exam findings are noted in the Assessment and Plan.  Forehead x 1, R med cheek x 2, L upper lip x 1 (4) Erythematous thin papules/macules with gritty scale.   R temple x 1, L temple x 2 (3) Erythematous stuck-on, waxy papule or plaque   Assessment & Plan  AK (actinic keratosis) (4) Forehead x 1, R med cheek x 2, L upper lip x 1  Destruction of lesion - Forehead x 1, R med cheek x 2, L upper lip x 1 Complexity: simple   Destruction method: cryotherapy   Informed consent: discussed and consent obtained   Timeout:  patient name, date of birth, surgical site, and procedure verified Lesion destroyed using liquid nitrogen: Yes   Region frozen until ice ball extended beyond lesion: Yes   Outcome: patient tolerated procedure well with no complications   Post-procedure details: wound care instructions given    Inflamed seborrheic keratosis (3) R temple x 1, L temple x 2  Destruction of lesion - R temple x 1, L temple x 2 Complexity: simple   Destruction method: cryotherapy   Informed consent: discussed and consent obtained   Timeout:  patient name, date of birth, surgical site, and procedure verified Lesion  destroyed using liquid nitrogen: Yes   Region frozen until ice ball extended beyond lesion: Yes   Outcome: patient tolerated procedure well with no complications   Post-procedure details: wound care instructions given    Actinic Damage - chronic, secondary to cumulative UV radiation exposure/sun exposure over time - diffuse scaly erythematous macules with underlying dyspigmentation - Recommend daily broad spectrum sunscreen SPF 30+ to sun-exposed areas, reapply every 2 hours as needed.  - Recommend staying in the shade or wearing long sleeves, sun glasses (UVA+UVB protection) and wide brim hats (4-inch brim around the entire circumference of the hat). - Call for new or changing lesions.  Seborrheic Keratoses - Stuck-on, waxy, tan-brown papules and/or plaques  - Benign-appearing - Discussed benign etiology and prognosis. - Observe - Call for any changes  Return in about 8 months (around 12/09/2022) for AK follow up .  [name], CMA, am acting as scribe for Sarina Ser, MD . Documentation: I have reviewed the above documentation for accuracy and completeness, and I agree with the above.  Sarina Ser, MD

## 2022-04-08 NOTE — Patient Instructions (Signed)

## 2022-04-18 ENCOUNTER — Encounter: Payer: Self-pay | Admitting: Dermatology

## 2022-05-15 ENCOUNTER — Other Ambulatory Visit: Payer: Self-pay | Admitting: Medical

## 2022-06-03 DIAGNOSIS — E785 Hyperlipidemia, unspecified: Secondary | ICD-10-CM | POA: Diagnosis not present

## 2022-06-03 DIAGNOSIS — R55 Syncope and collapse: Secondary | ICD-10-CM | POA: Diagnosis not present

## 2022-06-03 DIAGNOSIS — I10 Essential (primary) hypertension: Secondary | ICD-10-CM | POA: Diagnosis not present

## 2022-06-03 DIAGNOSIS — I6523 Occlusion and stenosis of bilateral carotid arteries: Secondary | ICD-10-CM | POA: Diagnosis not present

## 2022-06-04 DIAGNOSIS — I10 Essential (primary) hypertension: Secondary | ICD-10-CM | POA: Diagnosis not present

## 2022-06-04 DIAGNOSIS — Z79899 Other long term (current) drug therapy: Secondary | ICD-10-CM | POA: Diagnosis not present

## 2022-06-04 DIAGNOSIS — R7309 Other abnormal glucose: Secondary | ICD-10-CM | POA: Diagnosis not present

## 2022-06-11 DIAGNOSIS — I2510 Atherosclerotic heart disease of native coronary artery without angina pectoris: Secondary | ICD-10-CM | POA: Diagnosis not present

## 2022-06-11 DIAGNOSIS — R7309 Other abnormal glucose: Secondary | ICD-10-CM | POA: Diagnosis not present

## 2022-06-11 DIAGNOSIS — E039 Hypothyroidism, unspecified: Secondary | ICD-10-CM | POA: Diagnosis not present

## 2022-06-11 DIAGNOSIS — I10 Essential (primary) hypertension: Secondary | ICD-10-CM | POA: Diagnosis not present

## 2022-06-11 DIAGNOSIS — E782 Mixed hyperlipidemia: Secondary | ICD-10-CM | POA: Diagnosis not present

## 2022-06-11 DIAGNOSIS — Z79899 Other long term (current) drug therapy: Secondary | ICD-10-CM | POA: Diagnosis not present

## 2022-06-15 DIAGNOSIS — H401130 Primary open-angle glaucoma, bilateral, stage unspecified: Secondary | ICD-10-CM | POA: Diagnosis not present

## 2022-06-20 ENCOUNTER — Other Ambulatory Visit: Payer: Self-pay | Admitting: Cardiovascular Disease

## 2022-07-07 DIAGNOSIS — H6123 Impacted cerumen, bilateral: Secondary | ICD-10-CM | POA: Diagnosis not present

## 2022-07-07 DIAGNOSIS — H902 Conductive hearing loss, unspecified: Secondary | ICD-10-CM | POA: Diagnosis not present

## 2022-09-07 DIAGNOSIS — R7309 Other abnormal glucose: Secondary | ICD-10-CM | POA: Diagnosis not present

## 2022-09-07 DIAGNOSIS — E782 Mixed hyperlipidemia: Secondary | ICD-10-CM | POA: Diagnosis not present

## 2022-09-07 DIAGNOSIS — E039 Hypothyroidism, unspecified: Secondary | ICD-10-CM | POA: Diagnosis not present

## 2022-09-07 DIAGNOSIS — I10 Essential (primary) hypertension: Secondary | ICD-10-CM | POA: Diagnosis not present

## 2022-09-07 DIAGNOSIS — Z79899 Other long term (current) drug therapy: Secondary | ICD-10-CM | POA: Diagnosis not present

## 2022-09-15 DIAGNOSIS — R7309 Other abnormal glucose: Secondary | ICD-10-CM | POA: Diagnosis not present

## 2022-09-15 DIAGNOSIS — I10 Essential (primary) hypertension: Secondary | ICD-10-CM | POA: Diagnosis not present

## 2022-09-15 DIAGNOSIS — Z125 Encounter for screening for malignant neoplasm of prostate: Secondary | ICD-10-CM | POA: Diagnosis not present

## 2022-09-15 DIAGNOSIS — Z79899 Other long term (current) drug therapy: Secondary | ICD-10-CM | POA: Diagnosis not present

## 2022-09-15 DIAGNOSIS — E782 Mixed hyperlipidemia: Secondary | ICD-10-CM | POA: Diagnosis not present

## 2022-09-15 DIAGNOSIS — J029 Acute pharyngitis, unspecified: Secondary | ICD-10-CM | POA: Diagnosis not present

## 2022-09-15 DIAGNOSIS — E039 Hypothyroidism, unspecified: Secondary | ICD-10-CM | POA: Diagnosis not present

## 2022-09-15 DIAGNOSIS — I2510 Atherosclerotic heart disease of native coronary artery without angina pectoris: Secondary | ICD-10-CM | POA: Diagnosis not present

## 2022-12-09 ENCOUNTER — Ambulatory Visit (INDEPENDENT_AMBULATORY_CARE_PROVIDER_SITE_OTHER): Payer: PPO | Admitting: Dermatology

## 2022-12-09 DIAGNOSIS — D692 Other nonthrombocytopenic purpura: Secondary | ICD-10-CM | POA: Diagnosis not present

## 2022-12-09 DIAGNOSIS — L570 Actinic keratosis: Secondary | ICD-10-CM

## 2022-12-09 DIAGNOSIS — L820 Inflamed seborrheic keratosis: Secondary | ICD-10-CM

## 2022-12-09 DIAGNOSIS — L578 Other skin changes due to chronic exposure to nonionizing radiation: Secondary | ICD-10-CM

## 2022-12-09 NOTE — Patient Instructions (Signed)
Cryotherapy Aftercare  Wash gently with soap and water everyday.   Apply Vaseline and Band-Aid daily until healed.     Due to recent changes in healthcare laws, you may see results of your pathology and/or laboratory studies on MyChart before the doctors have had a chance to review them. We understand that in some cases there may be results that are confusing or concerning to you. Please understand that not all results are received at the same time and often the doctors may need to interpret multiple results in order to provide you with the best plan of care or course of treatment. Therefore, we ask that you please give us 2 business days to thoroughly review all your results before contacting the office for clarification. Should we see a critical lab result, you will be contacted sooner.   If You Need Anything After Your Visit  If you have any questions or concerns for your doctor, please call our main line at [phone] and press option 4 to reach your doctor's medical assistant. If no one answers, please leave a voicemail as directed and we will return your call as soon as possible. Messages left after 4 pm will be answered the following business day.   You may also send us a message via MyChart. We typically respond to MyChart messages within 1-2 business days.  For prescription refills, please ask your pharmacy to contact our office. Our fax number is [phone].  If you have an urgent issue when the clinic is closed that cannot wait until the next business day, you can page your doctor at the number below.    Please note that while we do our best to be available for urgent issues outside of office hours, we are not available 24/7.   If you have an urgent issue and are unable to reach us, you may choose to seek medical care at your doctor's office, retail clinic, urgent care center, or emergency room.  If you have a medical emergency, please immediately call 911 or go to the  emergency department.  Pager Numbers  - Dr. Kowalski: [phone]  - Dr. Moye: [phone]  - Dr. Stewart: [phone]  In the event of inclement weather, please call our main line at [phone] for an update on the status of any delays or closures.  Dermatology Medication Tips: Please keep the boxes that topical medications come in in order to help keep track of the instructions about where and how to use these. Pharmacies typically print the medication instructions only on the boxes and not directly on the medication tubes.   If your medication is too expensive, please contact our office at [phone] option 4 or send us a message through MyChart.   We are unable to tell what your co-pay for medications will be in advance as this is different depending on your insurance coverage. However, we may be able to find a substitute medication at lower cost or fill out paperwork to get insurance to cover a needed medication.   If a prior authorization is required to get your medication covered by your insurance company, please allow us 1-2 business days to complete this process.  Drug prices often vary depending on where the prescription is filled and some pharmacies may offer cheaper prices.  The website www.goodrx.com contains coupons for medications through different pharmacies. The prices here do not account for what the cost may be with help from insurance (it may be cheaper with your insurance), but the website can   give you the price if you did not use any insurance.  - You can print the associated coupon and take it with your prescription to the pharmacy.  - You may also stop by our office during regular business hours and pick up a GoodRx coupon card.  - If you need your prescription sent electronically to a different pharmacy, notify our office through [facility] MyChart or by phone at [phone] option 4.     Si Usted Necesita Algo Despus de Su Visita  Tambin puede  enviarnos un mensaje a travs de MyChart. Por lo general respondemos a los mensajes de MyChart en el transcurso de 1 a 2 das hbiles.  Para renovar recetas, por favor pida a su farmacia que se ponga en contacto con nuestra oficina. Nuestro nmero de fax es el [phone].  Si tiene un asunto urgente cuando la clnica est cerrada y que no puede esperar hasta el siguiente da hbil, puede llamar/localizar a su doctor(a) al nmero que aparece a continuacin.   Por favor, tenga en cuenta que aunque hacemos todo lo posible para estar disponibles para asuntos urgentes fuera del horario de oficina, no estamos disponibles las 24 horas del da, los 7 das de la semana.   Si tiene un problema urgente y no puede comunicarse con nosotros, puede optar por buscar atencin mdica  en el consultorio de su doctor(a), en una clnica privada, en un centro de atencin urgente o en una sala de emergencias.  Si tiene una emergencia mdica, por favor llame inmediatamente al 911 o vaya a la sala de emergencias.  Nmeros de bper  - Dr. Kowalski: [phone]  - Dra. Moye: [phone]  - Dra. Stewart: [phone]  En caso de inclemencias del tiempo, por favor llame a nuestra lnea principal al [phone] para una actualizacin sobre el estado de cualquier retraso o cierre.  Consejos para la medicacin en dermatologa: Por favor, guarde las cajas en las que vienen los medicamentos de uso tpico para ayudarle a seguir las instrucciones sobre dnde y cmo usarlos. Las farmacias generalmente imprimen las instrucciones del medicamento slo en las cajas y no directamente en los tubos del medicamento.   Si su medicamento es muy caro, por favor, pngase en contacto con nuestra oficina llamando al [phone] y presione la opcin 4 o envenos un mensaje a travs de MyChart.   No podemos decirle cul ser su copago por los medicamentos por adelantado ya que esto es diferente dependiendo de la cobertura de su seguro.  Sin embargo, es posible que podamos encontrar un medicamento sustituto a menor costo o llenar un formulario para que el seguro cubra el medicamento que se considera necesario.   Si se requiere una autorizacin previa para que su compaa de seguros cubra su medicamento, por favor permtanos de 1 a 2 das hbiles para completar este proceso.  Los precios de los medicamentos varan con frecuencia dependiendo del lugar de dnde se surte la receta y alguna farmacias pueden ofrecer precios ms baratos.  El sitio web www.goodrx.com tiene cupones para medicamentos de diferentes farmacias. Los precios aqu no tienen en cuenta lo que podra costar con la ayuda del seguro (puede ser ms barato con su seguro), pero el sitio web puede darle el precio si no utiliz ningn seguro.  - Puede imprimir el cupn correspondiente y llevarlo con su receta a la farmacia.  - Tambin puede pasar por nuestra oficina durante el horario de atencin regular y recoger una tarjeta de cupones de GoodRx.  -   Si necesita que su receta se enve electrnicamente a una farmacia diferente, informe a nuestra oficina a travs de MyChart de [place] o por telfono llamando al [phone] y presione la opcin 4.  

## 2022-12-09 NOTE — Progress Notes (Signed)
   Follow-Up Visit   Subjective  Daniel Lewis is a 88 y.o. male who presents for the following: Actinic Keratosis (6 month follow up - Forehead, right medial cheek, left upper lip treated with LN2). The patient has spots, moles and lesions to be evaluated, some may be new or changing and the patient has concerns that these could be cancer.  The following portions of the chart were reviewed this encounter and updated as appropriate:   Tobacco  Allergies  Meds  Problems  Med Hx  Surg Hx  Fam Hx     Review of Systems:  No other skin or systemic complaints except as noted in HPI or Assessment and Plan.  Objective  Well appearing patient in no apparent distress; mood and affect are within normal limits.  A focused examination was performed including face, ears, hands. Relevant physical exam findings are noted in the Assessment and Plan.  Face, ears (10) Erythematous thin papules/macules with gritty scale.   Left Temple Erythematous stuck-on, waxy papule or plaque   Assessment & Plan   Actinic Damage - chronic, secondary to cumulative UV radiation exposure/sun exposure over time - diffuse scaly erythematous macules with underlying dyspigmentation - Recommend daily broad spectrum sunscreen SPF 30+ to sun-exposed areas, reapply every 2 hours as needed.  - Recommend staying in the shade or wearing long sleeves, sun glasses (UVA+UVB protection) and wide brim hats (4-inch brim around the entire circumference of the hat). - Call for new or changing lesions.  Purpura - Chronic; persistent and recurrent.  Treatable, but not curable. - Violaceous macules and patches - Benign - Related to trauma, age, sun damage and/or use of blood thinners, chronic use of topical and/or oral steroids - Observe - Can use OTC arnica containing moisturizer such as Dermend Bruise Formula if desired - Call for worsening or other concerns  AK (actinic keratosis) (10) Face, ears Destruction of lesion  - Face, ears Complexity: simple   Destruction method: cryotherapy   Informed consent: discussed and consent obtained   Timeout:  patient name, date of birth, surgical site, and procedure verified Lesion destroyed using liquid nitrogen: Yes   Region frozen until ice ball extended beyond lesion: Yes   Outcome: patient tolerated procedure well with no complications   Post-procedure details: wound care instructions given    Inflamed seborrheic keratosis Left Temple Destruction of lesion - Left Temple Complexity: simple   Destruction method: cryotherapy   Informed consent: discussed and consent obtained   Timeout:  patient name, date of birth, surgical site, and procedure verified Lesion destroyed using liquid nitrogen: Yes   Region frozen until ice ball extended beyond lesion: Yes   Outcome: patient tolerated procedure well with no complications   Post-procedure details: wound care instructions given    Return in about 8 months (around 08/10/2023) for AK follow up.  I, Ashok Cordia, CMA, am acting as scribe for Sarina Ser, MD . Documentation: I have reviewed the above documentation for accuracy and completeness, and I agree with the above.  Sarina Ser, MD

## 2022-12-22 ENCOUNTER — Encounter: Payer: Self-pay | Admitting: Dermatology

## 2022-12-22 DIAGNOSIS — Z03818 Encounter for observation for suspected exposure to other biological agents ruled out: Secondary | ICD-10-CM | POA: Diagnosis not present

## 2023-01-06 DIAGNOSIS — H6123 Impacted cerumen, bilateral: Secondary | ICD-10-CM | POA: Diagnosis not present

## 2023-01-06 DIAGNOSIS — H902 Conductive hearing loss, unspecified: Secondary | ICD-10-CM | POA: Diagnosis not present

## 2023-01-14 ENCOUNTER — Other Ambulatory Visit: Payer: Self-pay | Admitting: Cardiovascular Disease

## 2023-01-14 ENCOUNTER — Other Ambulatory Visit: Payer: Self-pay

## 2023-01-14 MED ORDER — LOSARTAN POTASSIUM 50 MG PO TABS: 50.0000 mg | ORAL_TABLET | Freq: Every morning | ORAL | 0 refills | Status: DC

## 2023-01-14 NOTE — Telephone Encounter (Signed)
90 day prescription request

## 2023-03-11 DIAGNOSIS — E039 Hypothyroidism, unspecified: Secondary | ICD-10-CM | POA: Diagnosis not present

## 2023-03-11 DIAGNOSIS — R7309 Other abnormal glucose: Secondary | ICD-10-CM | POA: Diagnosis not present

## 2023-03-11 DIAGNOSIS — I10 Essential (primary) hypertension: Secondary | ICD-10-CM | POA: Diagnosis not present

## 2023-03-11 DIAGNOSIS — Z125 Encounter for screening for malignant neoplasm of prostate: Secondary | ICD-10-CM | POA: Diagnosis not present

## 2023-03-11 DIAGNOSIS — Z79899 Other long term (current) drug therapy: Secondary | ICD-10-CM | POA: Diagnosis not present

## 2023-03-11 DIAGNOSIS — E782 Mixed hyperlipidemia: Secondary | ICD-10-CM | POA: Diagnosis not present

## 2023-03-18 DIAGNOSIS — Z79899 Other long term (current) drug therapy: Secondary | ICD-10-CM | POA: Diagnosis not present

## 2023-03-18 DIAGNOSIS — I10 Essential (primary) hypertension: Secondary | ICD-10-CM | POA: Diagnosis not present

## 2023-03-18 DIAGNOSIS — E782 Mixed hyperlipidemia: Secondary | ICD-10-CM | POA: Diagnosis not present

## 2023-03-18 DIAGNOSIS — I2510 Atherosclerotic heart disease of native coronary artery without angina pectoris: Secondary | ICD-10-CM | POA: Diagnosis not present

## 2023-03-18 DIAGNOSIS — I25118 Atherosclerotic heart disease of native coronary artery with other forms of angina pectoris: Secondary | ICD-10-CM | POA: Diagnosis not present

## 2023-03-18 DIAGNOSIS — Z0000 Encounter for general adult medical examination without abnormal findings: Secondary | ICD-10-CM | POA: Diagnosis not present

## 2023-03-18 DIAGNOSIS — R7303 Prediabetes: Secondary | ICD-10-CM | POA: Diagnosis not present

## 2023-03-18 DIAGNOSIS — I700 Atherosclerosis of aorta: Secondary | ICD-10-CM | POA: Diagnosis not present

## 2023-03-18 DIAGNOSIS — E039 Hypothyroidism, unspecified: Secondary | ICD-10-CM | POA: Diagnosis not present

## 2023-03-18 DIAGNOSIS — R7309 Other abnormal glucose: Secondary | ICD-10-CM | POA: Diagnosis not present

## 2023-04-16 DIAGNOSIS — Z79899 Other long term (current) drug therapy: Secondary | ICD-10-CM | POA: Diagnosis not present

## 2023-04-21 ENCOUNTER — Other Ambulatory Visit: Payer: Self-pay | Admitting: Cardiovascular Disease

## 2023-04-29 DIAGNOSIS — N1832 Chronic kidney disease, stage 3b: Secondary | ICD-10-CM | POA: Diagnosis not present

## 2023-04-29 DIAGNOSIS — I10 Essential (primary) hypertension: Secondary | ICD-10-CM | POA: Diagnosis not present

## 2023-05-03 ENCOUNTER — Other Ambulatory Visit: Payer: Self-pay | Admitting: Internal Medicine

## 2023-05-03 ENCOUNTER — Ambulatory Visit
Admission: RE | Admit: 2023-05-03 | Discharge: 2023-05-03 | Disposition: A | Discharge status: Home / Self Care | Payer: PPO | Source: Ambulatory Visit | Attending: Internal Medicine | Admitting: Internal Medicine

## 2023-05-03 DIAGNOSIS — N1832 Chronic kidney disease, stage 3b: Secondary | ICD-10-CM

## 2023-05-03 DIAGNOSIS — N189 Chronic kidney disease, unspecified: Secondary | ICD-10-CM | POA: Diagnosis not present

## 2023-05-03 DIAGNOSIS — Z905 Acquired absence of kidney: Secondary | ICD-10-CM | POA: Diagnosis not present

## 2023-05-10 NOTE — Progress Notes (Unsigned)
Cardiology Office Note  Date:  05/11/2023   ID:  Daniel Lewis, DOB 1935-05-15, MRN [id number]  PCP:  Marguarite Arbour, MD   Chief Complaint  Patient presents with   12 month follow up     "Doing well." Medications reviewed by the patient verbally.     HPI:  [name] is a 88 year-old gentleman with past medical history of Hypertension Hyperlipidemia Thyroid disease  coronary artery disease with angioplasty over 10 years ago Who presents for follow-up of his coronary artery disease coronary disease  Last seen in clinic by myself May 2023 In follow-up he reports no near-syncope or syncope episodes Recently seen by primary care, told he was dehydrated and needed to drink more Creatinine 1.4 BUN 38, above his baseline  Prior episodes of syncope x 2.  first was in November 2022. hot day and had been standing for 45 minutes before he passed out.  Felt to be dehydration, orthostasis Second episode had diarrhea night before, While in the chair getting a haircut felt dizzy, lightheaded Passed out for a couple seconds. He drank water and he felt better.  NOW Off atenolol for bradycardia Continues on losartan, isosorbide, currently taking them both in the morning  Lab work reviewed A1C 6.8 LDL 72  EKG personally reviewed by myself on todays visit Normal sinus rhythm rate 54 bpm LVH axis deviation no significant ST-T wave changes, left anterior fascicular block  Other past medical history reviewed Stress test July 2021 Myoview, low risk study  Echocardiogram 2015 Ejection fraction greater than 55%  Total chol 127, LDL 66 in 01/2021  Cath:  50 to 75% stenosis proximal LAD.    PMH:   has a past medical history of Actinic keratoses, Anemia, ASHD (arteriosclerotic heart disease), Chronic kidney disease, Erectile dysfunction, GERD (gastroesophageal reflux disease), Hyperlipidemia, Hypertension, Hypothyroidism, MI (myocardial infarction) (HCC), Prostate cancer  (HCC), and Vertigo.  PSH:    Past Surgical History:  Procedure Laterality Date   CATARACT EXTRACTION W/PHACO Left 07/23/2021   Procedure: CATARACT EXTRACTION PHACO AND INTRAOCULAR LENS PLACEMENT (IOC) LEFT TORIC  8.27 01:12.2 LENS [place] goniotomy;  Surgeon: Lockie Mola, MD;  Location: [facility];  Service: Ophthalmology;  Laterality: Left;   CATARACT EXTRACTION W/PHACO Right 08/13/2021   Procedure: CATARACT EXTRACTION PHACO AND INTRAOCULAR LENS PLACEMENT (IOC) RIGHT TORIC LENS [place] dual blade goniotomy;  Surgeon: Lockie Mola, MD;  Location: [facility];  Service: Ophthalmology;  Laterality: Right;  10.08 01:26.6   CHOLECYSTECTOMY     COLONOSCOPY     COLONOSCOPY WITH PROPOFOL N/A 12/16/2015   Procedure: COLONOSCOPY WITH PROPOFOL;  Surgeon: Scot Jun, MD;  Location[facility];  Service: Endoscopy;  Laterality: N/A;   INGUINAL HERNIA REPAIR     LAPAROTOMY N/A 12/02/2018   Procedure: EXPLORATORY LAPAROTOMY, LYSIS OF ADHESIONS, POSSIBLE SBO;  Surgeon: Leafy Ro, MD;  Location: [facility];  Service: General;  Laterality: N/A;   PROSTATECTOMY     XI ROBOTIC ASSISTED INGUINAL HERNIA REPAIR WITH MESH Left 06/18/2020   Procedure: XI ROBOTIC ASSISTED INGUINAL HERNIA REPAIR WITH MESH, possible bilateral;  Surgeon: Leafy Ro, MD;  Location: [facility];  Service: General;  Laterality: Left;    Current Outpatient Medications  Medication Sig Dispense Refill   Ascorbic Acid (VITAMIN C) 500 MG CAPS Take 500 mg by mouth daily.      aspirin EC 81 MG tablet Take 81 mg by mouth daily.      atorvastatin (LIPITOR) 20 MG tablet  Take 1 tablet (20 mg total) by mouth daily. 90 tablet 3   isosorbide mononitrate (IMDUR) 30 MG 24 hr tablet TAKE 1 TABLET(30 MG) BY MOUTH DAILY 90 tablet 3   losartan (COZAAR) 50 MG tablet TAKE 1 TABLET(50 MG) BY MOUTH IN THE MORNING 90 tablet 0   Omega 3-6-9 Fatty Acids (OMEGA 3-6-9 COMPLEX PO) Take 1 capsule by mouth daily.      OVER  THE COUNTER MEDICATION Take 2 capsules by mouth daily. Multi Greens      pantoprazole (PROTONIX) 40 MG tablet Take 40 mg by mouth daily.     SYNTHROID 75 MCG tablet Take 75 mcg by mouth daily before breakfast.   0   VITAMIN D, CHOLECALCIFEROL, PO Take 1 tablet by mouth daily.      zinc gluconate 50 MG tablet Take 50 mg by mouth daily.      No current facility-administered medications for this visit.     Allergies:   Patient has no known allergies.   Social History:  The patient  reports that he has never smoked. He has never used smokeless tobacco. He reports that he does not drink alcohol and does not use drugs.   Family History:   family history is not on file.    Review of Systems: Review of Systems  Constitutional: Negative.   HENT: Negative.    Respiratory: Negative.    Cardiovascular: Negative.   Gastrointestinal: Negative.   Musculoskeletal: Negative.   Neurological: Negative.   Psychiatric/Behavioral: Negative.    All other systems reviewed and are negative.   PHYSICAL EXAM: VS:  BP 130/60 (BP Location: Left Arm, Patient Position: Sitting, Cuff Size: Normal)   Pulse (!) 54   Ht 5\' 8"  (1.727 m)   Wt 133 lb (60.3 kg)   SpO2 97%   BMI 20.22 kg/m  , BMI Body mass index is 20.22 kg/m. Constitutional:  oriented to person, place, and time. No distress.  HENT:  Head: Grossly normal Eyes:  no discharge. No scleral icterus.  Neck: No JVD, no carotid bruits  Cardiovascular: Regular rate and rhythm, no murmurs appreciated Pulmonary/Chest: Clear to auscultation bilaterally, no wheezes or rails Abdominal: Soft.  no distension.  no tenderness.  Musculoskeletal: Normal range of motion Neurological:  normal muscle tone. Coordination normal. No atrophy Skin: Skin warm and dry Psychiatric: normal affect, pleasant  Recent Labs: No results found for requested labs within last 365 days.    Lipid Panel Lab Results  Component Value Date   TRIG 124 12/06/2018      Wt  Readings from Last 3 Encounters:  05/11/23 133 lb (60.3 kg)  03/27/22 139 lb (63 kg)  02/13/22 140 lb (63.5 kg)     ASSESSMENT AND PLAN:  Problem List Items Addressed This Visit     Protein-calorie malnutrition, severe   Other Visit Diagnoses     Coronary artery disease of native artery of native heart with stable angina pectoris (HCC)    -  Primary   Relevant Orders   EKG 12-Lead   Primary hypertension       Relevant Orders   EKG 12-Lead   Mixed hyperlipidemia       Syncope and collapse       Relevant Orders   EKG 12-Lead   Bradycardia       Relevant Orders   EKG 12-Lead     Coronary artery disease with stable angina Reports having angioplasty, no stent placement many years ago " 30 years ago"  Stress test 2021 with no ischemia as part of preoperative evaluation -Echocardiogram 2015 normal study Currently with no symptoms of angina. No further workup at this time. Continue current medication regimen.  Essential hypertension Blood pressure stable, recommend he take one of his blood pressure medications in the morning and other in the evening Currently on isosorbide and losartan  Marked bradycardia Mild bradycardia, off atenolol, asymptomatic, stable blood pressure  Hyperlipidemia Continue low total 20 daily    Total encounter time more than 30 minutes  Greater than 50% was spent in counseling and coordination of care with the patient    Signed, Dossie Arbour, M.D., Ph.D. [facility] [place], [place] [phone]

## 2023-05-11 ENCOUNTER — Encounter: Payer: Self-pay | Admitting: Cardiovascular Disease

## 2023-05-11 ENCOUNTER — Ambulatory Visit: Payer: PPO | Attending: Cardiovascular Disease | Admitting: Cardiovascular Disease

## 2023-05-11 VITALS — BP 130/60 | HR 54 | Ht 68.0 in | Wt 133.0 lb

## 2023-05-11 DIAGNOSIS — I10 Essential (primary) hypertension: Secondary | ICD-10-CM | POA: Diagnosis not present

## 2023-05-11 DIAGNOSIS — R001 Bradycardia, unspecified: Secondary | ICD-10-CM

## 2023-05-11 DIAGNOSIS — E43 Unspecified severe protein-calorie malnutrition: Secondary | ICD-10-CM

## 2023-05-11 DIAGNOSIS — R55 Syncope and collapse: Secondary | ICD-10-CM

## 2023-05-11 DIAGNOSIS — I25118 Atherosclerotic heart disease of native coronary artery with other forms of angina pectoris: Secondary | ICD-10-CM | POA: Diagnosis not present

## 2023-05-11 DIAGNOSIS — E782 Mixed hyperlipidemia: Secondary | ICD-10-CM

## 2023-05-11 MED ORDER — ISOSORBIDE MONONITRATE ER 30 MG PO TB24: ORAL_TABLET | ORAL | 3 refills | Status: DC

## 2023-05-11 MED ORDER — ATORVASTATIN CALCIUM 20 MG PO TABS
20.0000 mg | ORAL_TABLET | Freq: Every day | ORAL | 3 refills | Status: DC
Start: 2023-05-11 — End: 2024-05-12

## 2023-05-11 NOTE — Patient Instructions (Signed)
Medication Instructions:  No changes  If you need a refill on your cardiac medications before your next appointment, please call your pharmacy.   Lab work: No new labs needed  Testing/Procedures: No new testing needed  Follow-Up: At [facility], you and your health needs are our priority.  As part of our continuing mission to provide you with exceptional heart care, we have created designated Provider Care Teams.  These Care Teams include your primary Cardiologist (physician) and Advanced Practice Providers (APPs -  Physician Assistants and Nurse Practitioners) who all work together to provide you with the care you need, when you need it.  You will need a follow up appointment in 12 months  Providers on your designated Care Team:   Christopher Berge, NP Ryan Dunn, PA-C Cadence Furth, PA-C  COVID-19 Vaccine Information can be found at: [email or web address] For questions related to vaccine distribution or appointments, please email [email or web address] or call [phone].   

## 2023-06-09 DIAGNOSIS — H5340 Unspecified visual field defects: Secondary | ICD-10-CM | POA: Diagnosis not present

## 2023-06-09 DIAGNOSIS — H4010X1 Unspecified open-angle glaucoma, mild stage: Secondary | ICD-10-CM | POA: Diagnosis not present

## 2023-06-09 DIAGNOSIS — H401131 Primary open-angle glaucoma, bilateral, mild stage: Secondary | ICD-10-CM | POA: Diagnosis not present

## 2023-06-15 ENCOUNTER — Other Ambulatory Visit: Payer: Self-pay | Admitting: Cardiovascular Disease

## 2023-06-15 DIAGNOSIS — E782 Mixed hyperlipidemia: Secondary | ICD-10-CM

## 2023-07-07 DIAGNOSIS — H6123 Impacted cerumen, bilateral: Secondary | ICD-10-CM | POA: Diagnosis not present

## 2023-07-07 DIAGNOSIS — H902 Conductive hearing loss, unspecified: Secondary | ICD-10-CM | POA: Diagnosis not present

## 2023-08-12 ENCOUNTER — Ambulatory Visit: Payer: PPO | Admitting: Dermatology

## 2023-08-12 ENCOUNTER — Encounter: Payer: Self-pay | Admitting: Dermatology

## 2023-08-12 VITALS — BP 123/59

## 2023-08-12 DIAGNOSIS — W908XXA Exposure to other nonionizing radiation, initial encounter: Secondary | ICD-10-CM

## 2023-08-12 DIAGNOSIS — L570 Actinic keratosis: Secondary | ICD-10-CM

## 2023-08-12 DIAGNOSIS — L821 Other seborrheic keratosis: Secondary | ICD-10-CM | POA: Diagnosis not present

## 2023-08-12 DIAGNOSIS — L814 Other melanin hyperpigmentation: Secondary | ICD-10-CM

## 2023-08-12 DIAGNOSIS — L578 Other skin changes due to chronic exposure to nonionizing radiation: Secondary | ICD-10-CM

## 2023-08-12 DIAGNOSIS — L820 Inflamed seborrheic keratosis: Secondary | ICD-10-CM | POA: Diagnosis not present

## 2023-08-12 NOTE — Progress Notes (Signed)
   Follow-Up Visit   Subjective  Daniel Lewis is a 88 y.o. male who presents for the following: AK follow up of face and ears treated with LN2 x 10  The patient has spots, moles and lesions to be evaluated, some may be new or changing and the patient may have concern these could be cancer.   The following portions of the chart were reviewed this encounter and updated as appropriate: medications, allergies, medical history  Review of Systems:  No other skin or systemic complaints except as noted in HPI or Assessment and Plan.  Objective  Well appearing patient in no apparent distress; mood and affect are within normal limits.   A focused examination was performed of the following areas: face, ears  Relevant exam findings are noted in the Assessment and Plan.  Face (12) Erythematous thin papules/macules with gritty scale.   Face (5) Erythematous stuck-on, waxy papule or plaque    Assessment & Plan   ACTINIC DAMAGE - chronic, secondary to cumulative UV radiation exposure/sun exposure over time - diffuse scaly erythematous macules with underlying dyspigmentation - Recommend daily broad spectrum sunscreen SPF 30+ to sun-exposed areas, reapply every 2 hours as needed.  - Recommend staying in the shade or wearing long sleeves, sun glasses (UVA+UVB protection) and wide brim hats (4-inch brim around the entire circumference of the hat). - Call for new or changing lesions.  LENTIGINES Exam: scattered tan macules Due to sun exposure Treatment Plan: Benign-appearing, observe. Recommend daily broad spectrum sunscreen SPF 30+ to sun-exposed areas, reapply every 2 hours as needed.  Call for any changes  SEBORRHEIC KERATOSIS - Stuck-on, waxy, tan-brown papules and/or plaques  - Benign-appearing - Discussed benign etiology and prognosis. - Observe - Call for any changes   AK (actinic keratosis) (12) Face  Destruction of lesion - Face (12) Complexity: simple   Destruction  method: cryotherapy   Informed consent: discussed and consent obtained   Timeout:  patient name, date of birth, surgical site, and procedure verified Lesion destroyed using liquid nitrogen: Yes   Region frozen until ice ball extended beyond lesion: Yes   Outcome: patient tolerated procedure well with no complications   Post-procedure details: wound care instructions given    Inflamed seborrheic keratosis (5) Face  Symptomatic, irritating, patient would like treated.  Benign-appearing.  Call clinic for new or changing lesions.    Destruction of lesion - Face (5) Complexity: simple   Destruction method: cryotherapy   Informed consent: discussed and consent obtained   Timeout:  patient name, date of birth, surgical site, and procedure verified Lesion destroyed using liquid nitrogen: Yes   Region frozen until ice ball extended beyond lesion: Yes   Outcome: patient tolerated procedure well with no complications   Post-procedure details: wound care instructions given      Return in about 8 months (around 04/10/2024) for AK follow up.  I, Joanie Coddington, CMA, am acting as scribe for Armida Sans, MD .   Documentation: I have reviewed the above documentation for accuracy and completeness, and I agree with the above.  Armida Sans, MD

## 2023-08-12 NOTE — Patient Instructions (Signed)

## 2023-08-17 ENCOUNTER — Other Ambulatory Visit: Payer: Self-pay | Admitting: Cardiovascular Disease

## 2023-09-10 DIAGNOSIS — I10 Essential (primary) hypertension: Secondary | ICD-10-CM | POA: Diagnosis not present

## 2023-09-13 ENCOUNTER — Other Ambulatory Visit: Payer: Self-pay | Admitting: Cardiovascular Disease

## 2023-09-17 DIAGNOSIS — E782 Mixed hyperlipidemia: Secondary | ICD-10-CM | POA: Diagnosis not present

## 2023-09-17 DIAGNOSIS — Z79899 Other long term (current) drug therapy: Secondary | ICD-10-CM | POA: Diagnosis not present

## 2023-09-17 DIAGNOSIS — R7303 Prediabetes: Secondary | ICD-10-CM | POA: Diagnosis not present

## 2023-09-17 DIAGNOSIS — I2510 Atherosclerotic heart disease of native coronary artery without angina pectoris: Secondary | ICD-10-CM | POA: Diagnosis not present

## 2023-09-17 DIAGNOSIS — E039 Hypothyroidism, unspecified: Secondary | ICD-10-CM | POA: Diagnosis not present

## 2023-09-17 DIAGNOSIS — N1832 Chronic kidney disease, stage 3b: Secondary | ICD-10-CM | POA: Diagnosis not present

## 2023-09-17 DIAGNOSIS — I10 Essential (primary) hypertension: Secondary | ICD-10-CM | POA: Diagnosis not present

## 2023-11-03 DIAGNOSIS — E782 Mixed hyperlipidemia: Secondary | ICD-10-CM | POA: Diagnosis not present

## 2023-11-03 DIAGNOSIS — E039 Hypothyroidism, unspecified: Secondary | ICD-10-CM | POA: Diagnosis not present

## 2023-11-03 DIAGNOSIS — I10 Essential (primary) hypertension: Secondary | ICD-10-CM | POA: Diagnosis not present

## 2023-11-03 DIAGNOSIS — Z79899 Other long term (current) drug therapy: Secondary | ICD-10-CM | POA: Diagnosis not present

## 2023-11-03 DIAGNOSIS — R7303 Prediabetes: Secondary | ICD-10-CM | POA: Diagnosis not present

## 2024-01-17 DIAGNOSIS — H902 Conductive hearing loss, unspecified: Secondary | ICD-10-CM | POA: Diagnosis not present

## 2024-01-17 DIAGNOSIS — H6123 Impacted cerumen, bilateral: Secondary | ICD-10-CM | POA: Diagnosis not present

## 2024-02-08 DIAGNOSIS — J208 Acute bronchitis due to other specified organisms: Secondary | ICD-10-CM | POA: Diagnosis not present

## 2024-02-08 DIAGNOSIS — J209 Acute bronchitis, unspecified: Secondary | ICD-10-CM | POA: Diagnosis not present

## 2024-02-28 ENCOUNTER — Other Ambulatory Visit: Payer: Self-pay | Admitting: Cardiovascular Disease

## 2024-03-23 DIAGNOSIS — I10 Essential (primary) hypertension: Secondary | ICD-10-CM | POA: Diagnosis not present

## 2024-03-23 DIAGNOSIS — Z79899 Other long term (current) drug therapy: Secondary | ICD-10-CM | POA: Diagnosis not present

## 2024-03-23 DIAGNOSIS — E039 Hypothyroidism, unspecified: Secondary | ICD-10-CM | POA: Diagnosis not present

## 2024-03-23 DIAGNOSIS — R8290 Unspecified abnormal findings in urine: Secondary | ICD-10-CM | POA: Diagnosis not present

## 2024-03-23 DIAGNOSIS — E782 Mixed hyperlipidemia: Secondary | ICD-10-CM | POA: Diagnosis not present

## 2024-03-23 DIAGNOSIS — R7303 Prediabetes: Secondary | ICD-10-CM | POA: Diagnosis not present

## 2024-03-23 DIAGNOSIS — I2510 Atherosclerotic heart disease of native coronary artery without angina pectoris: Secondary | ICD-10-CM | POA: Diagnosis not present

## 2024-03-23 DIAGNOSIS — Z125 Encounter for screening for malignant neoplasm of prostate: Secondary | ICD-10-CM | POA: Diagnosis not present

## 2024-03-23 DIAGNOSIS — Z0000 Encounter for general adult medical examination without abnormal findings: Secondary | ICD-10-CM | POA: Diagnosis not present

## 2024-03-23 DIAGNOSIS — N1832 Chronic kidney disease, stage 3b: Secondary | ICD-10-CM | POA: Diagnosis not present

## 2024-04-18 ENCOUNTER — Ambulatory Visit: Admitting: Dermatology

## 2024-04-19 ENCOUNTER — Ambulatory Visit: Payer: PPO | Admitting: Dermatology

## 2024-04-24 DIAGNOSIS — N1832 Chronic kidney disease, stage 3b: Secondary | ICD-10-CM | POA: Diagnosis not present

## 2024-05-04 DIAGNOSIS — R053 Chronic cough: Secondary | ICD-10-CM | POA: Diagnosis not present

## 2024-05-05 ENCOUNTER — Other Ambulatory Visit: Payer: Self-pay | Admitting: Internal Medicine

## 2024-05-05 DIAGNOSIS — R053 Chronic cough: Secondary | ICD-10-CM

## 2024-05-09 ENCOUNTER — Ambulatory Visit
Admission: RE | Admit: 2024-05-09 | Discharge: 2024-05-09 | Disposition: A | Discharge status: Home / Self Care | Source: Ambulatory Visit | Attending: Internal Medicine | Admitting: Internal Medicine

## 2024-05-09 DIAGNOSIS — R053 Chronic cough: Secondary | ICD-10-CM | POA: Diagnosis not present

## 2024-05-09 DIAGNOSIS — R918 Other nonspecific abnormal finding of lung field: Secondary | ICD-10-CM | POA: Diagnosis not present

## 2024-05-09 DIAGNOSIS — I700 Atherosclerosis of aorta: Secondary | ICD-10-CM | POA: Diagnosis not present

## 2024-05-11 NOTE — Progress Notes (Signed)
 Cardiology Office Note  Date:  05/12/2024   ID:  Daniel Lewis, DOB 1935-10-25, MRN [id number]  PCP:  Auston Reyes BIRCH, MD   Chief Complaint  Patient presents with   Follow-up    12 month f/u no complaints today. Meds reviewed verbally with pt.    HPI:  Daniel Lewis is a 89 year-old gentleman with past medical history of Hypertension Hyperlipidemia Thyroid  disease Smoker as teenager coronary artery disease with angioplasty over 10 years ago Who presents for follow-up of his coronary artery disease coronary disease  Last seen in clinic by myself 6/24  Getting over bronchitis 3-4 months ago CT scan results pending Images pulled up and reviewed: grossly clear  He denies significant chest pain on exertion Active, walks 1 to 2 miles with his dog every day Does lots of yard work  Denies near-syncope or syncope In the past with dehydration and has been encouraged to hydrate Prior episodes of syncope x 2  Lab work reviewed Total chol 158, LDL 90 on Lipitor 20  EKG personally reviewed by myself on todays visit EKG Interpretation Date/Time:  Friday May 12 2024 09:55:25 EDT Ventricular Rate:  62 PR Interval:  238 QRS Duration:  126 QT Interval:  422 QTC Calculation: 428 R Axis:   -56  Text Interpretation: Sinus rhythm with 1st degree A-V block Left axis deviation Left ventricular hypertrophy with QRS widening and repolarization abnormality ( R in aVL , Cornell product ) When compared with ECG of 03-May-2020 10:55, Minimal criteria for Septal infarct are no longer Present Confirmed by Perla Lye [phone]) on 05/12/2024 10:13:33 AM    Syncope  first was in November 2022. hot day and had been standing for 45 minutes before he passed out.  Felt to be dehydration, orthostasis Second episode had diarrhea night before, While in the chair getting a haircut felt dizzy, lightheaded Passed out for a couple seconds. He drank water and he felt better.  NOW Off atenolol   for bradycardia  Other past medical history reviewed Stress test July 2021 Myoview, low risk study  Echocardiogram 2015 Ejection fraction greater than 55%  Cath:  50 to 75% stenosis proximal LAD.    PMH:   has a past medical history of Actinic keratoses, Anemia, ASHD (arteriosclerotic heart disease), Chronic kidney disease, Erectile dysfunction, GERD (gastroesophageal reflux disease), Hyperlipidemia, Hypertension, Hypothyroidism, MI (myocardial infarction) (HCC), Prostate cancer (HCC), and Vertigo.  PSH:    Past Surgical History:  Procedure Laterality Date   CATARACT EXTRACTION W/PHACO Left 07/23/2021   Procedure: CATARACT EXTRACTION PHACO AND INTRAOCULAR LENS PLACEMENT (IOC) LEFT TORIC  8.27 01:12.2 LENS [place] goniotomy;  Surgeon: Mittie Gaskin, MD;  Location: [facility];  Service: Ophthalmology;  Laterality: Left;   CATARACT EXTRACTION W/PHACO Right 08/13/2021   Procedure: CATARACT EXTRACTION PHACO AND INTRAOCULAR LENS PLACEMENT (IOC) RIGHT TORIC LENS [place] dual blade goniotomy;  Surgeon: Mittie Gaskin, MD;  Location: [facility];  Service: Ophthalmology;  Laterality: Right;  10.08 01:26.6   CHOLECYSTECTOMY     COLONOSCOPY     COLONOSCOPY WITH PROPOFOL  N/A 12/16/2015   Procedure: COLONOSCOPY WITH PROPOFOL ;  Surgeon: Lamar ONEIDA Holmes, MD;  Location: [facility];  Service: Endoscopy;  Laterality: N/A;   INGUINAL HERNIA REPAIR     LAPAROTOMY N/A 12/02/2018   Procedure: EXPLORATORY LAPAROTOMY, LYSIS OF ADHESIONS, POSSIBLE SBO;  Surgeon: Jordis Laneta FALCON, MD;  Location: [facility];  Service: General;  Laterality: N/A;   PROSTATECTOMY     XI ROBOTIC ASSISTED INGUINAL HERNIA  REPAIR WITH MESH Left 06/18/2020   Procedure: XI ROBOTIC ASSISTED INGUINAL HERNIA REPAIR WITH MESH, possible bilateral;  Surgeon: Jordis Laneta FALCON, MD;  Location: [facility];  Service: General;  Laterality: Left;    Current Outpatient Medications  Medication Sig Dispense Refill   Ascorbic  Acid (VITAMIN C ) 500 MG CAPS Take 500 mg by mouth daily.      aspirin  EC 81 MG tablet Take 81 mg by mouth daily.      atorvastatin  (LIPITOR) 20 MG tablet Take 1 tablet (20 mg total) by mouth daily. 90 tablet 3   isosorbide  mononitrate (IMDUR ) 30 MG 24 hr tablet TAKE ONE TABLET BY MOUTH EVERY EVENING 90 tablet 0   losartan  (COZAAR ) 50 MG tablet TAKE 1 TABLET(50 MG) BY MOUTH IN THE MORNING 30 tablet 5   Omega 3-6-9 Fatty Acids (OMEGA 3-6-9 COMPLEX PO) Take 1 capsule by mouth daily.      OVER THE COUNTER MEDICATION Take 2 capsules by mouth daily. Multi Greens      pantoprazole  (PROTONIX ) 40 MG tablet Take 40 mg by mouth daily.     SYNTHROID  75 MCG tablet Take 75 mcg by mouth daily before breakfast.   0   VITAMIN D, CHOLECALCIFEROL, PO Take 1 tablet by mouth daily.      zinc gluconate 50 MG tablet Take 50 mg by mouth daily.      No current facility-administered medications for this visit.     Allergies:   Patient has no known allergies.   Social History:  The patient  reports that he has never smoked. He has never used smokeless tobacco. He reports that he does not drink alcohol and does not use drugs.   Family History:   family history is not on file.    Review of Systems: Review of Systems  Constitutional: Negative.   HENT: Negative.    Respiratory: Negative.    Cardiovascular: Negative.   Gastrointestinal: Negative.   Musculoskeletal: Negative.   Neurological: Negative.   Psychiatric/Behavioral: Negative.    All other systems reviewed and are negative.   PHYSICAL EXAM: VS:  BP (!) 158/78 (BP Location: Left Arm, Patient Position: Sitting, Cuff Size: Normal)   Pulse 62   Ht 5' 8 (1.727 m)   Wt 140 lb 2 oz (63.6 kg)   SpO2 99%   BMI 21.31 kg/m  , BMI Body mass index is 21.31 kg/m. Constitutional:  oriented to person, place, and time. No distress.  HENT:  Head: Grossly normal Eyes:  no discharge. No scleral icterus.  Neck: No JVD, no carotid bruits  Cardiovascular:  Regular rate and rhythm, no murmurs appreciated Pulmonary/Chest: Clear to auscultation bilaterally, no wheezes or rales Abdominal: Soft.  no distension.  no tenderness.  Musculoskeletal: Normal range of motion Neurological:  normal muscle tone. Coordination normal. No atrophy Skin: Skin warm and dry Psychiatric: normal affect, pleasant  Recent Labs: No results found for requested labs within last 365 days.    Lipid Panel Lab Results  Component Value Date   TRIG 124 12/06/2018      Wt Readings from Last 3 Encounters:  05/12/24 140 lb 2 oz (63.6 kg)  05/11/23 133 lb (60.3 kg)  03/27/22 139 lb (63 kg)     ASSESSMENT AND PLAN:  Problem List Items Addressed This Visit     Protein-calorie malnutrition, severe   Relevant Orders   EKG 12-Lead (Completed)   Other Visit Diagnoses       Coronary artery disease of native artery  of native heart with stable angina pectoris (HCC)    -  Primary   Relevant Orders   EKG 12-Lead (Completed)     Primary hypertension       Relevant Orders   EKG 12-Lead (Completed)     Mixed hyperlipidemia       Relevant Orders   EKG 12-Lead (Completed)     Syncope and collapse       Relevant Orders   EKG 12-Lead (Completed)     Bradycardia       Relevant Orders   EKG 12-Lead (Completed)      Coronary artery disease with stable angina Reports having angioplasty, no stent placement many years ago  30 years ago Stress test 2021 with no ischemia as part of preoperative evaluation -Echocardiogram 2015 normal study Currently with no symptoms of angina. No further workup at this time. Continue current medication regimen.  Essential hypertension Blood pressure elevated today but typically well-controlled when he sees primary care No changes to medications Continue isosorbide  and losartan  Beta-blocker previously held for bradycardia  Marked bradycardia off atenolol , heart rate and blood pressure stable No recent episodes of  syncope  Hyperlipidemia Continue Lipitor 20 daily Add Zetia  10 mg daily Goal LDL less than 70, currently running 90   Signed, Velinda Lunger, M.D., Ph.D. [facility] [place], [place] [phone]

## 2024-05-12 ENCOUNTER — Ambulatory Visit: Payer: PPO | Attending: Cardiovascular Disease | Admitting: Cardiovascular Disease

## 2024-05-12 ENCOUNTER — Encounter: Payer: Self-pay | Admitting: Cardiovascular Disease

## 2024-05-12 VITALS — BP 158/78 | HR 62 | Ht 68.0 in | Wt 140.1 lb

## 2024-05-12 DIAGNOSIS — R55 Syncope and collapse: Secondary | ICD-10-CM | POA: Diagnosis not present

## 2024-05-12 DIAGNOSIS — R001 Bradycardia, unspecified: Secondary | ICD-10-CM

## 2024-05-12 DIAGNOSIS — I10 Essential (primary) hypertension: Secondary | ICD-10-CM

## 2024-05-12 DIAGNOSIS — I25118 Atherosclerotic heart disease of native coronary artery with other forms of angina pectoris: Secondary | ICD-10-CM

## 2024-05-12 DIAGNOSIS — E782 Mixed hyperlipidemia: Secondary | ICD-10-CM | POA: Diagnosis not present

## 2024-05-12 DIAGNOSIS — E43 Unspecified severe protein-calorie malnutrition: Secondary | ICD-10-CM | POA: Diagnosis not present

## 2024-05-12 MED ORDER — LOSARTAN POTASSIUM 50 MG PO TABS
50.0000 mg | ORAL_TABLET | Freq: Every day | ORAL | 3 refills | Status: AC
Start: 2024-05-12 — End: ?

## 2024-05-12 MED ORDER — ATORVASTATIN CALCIUM 20 MG PO TABS: 20.0000 mg | ORAL_TABLET | Freq: Every day | ORAL | 3 refills | Status: AC

## 2024-05-12 MED ORDER — EZETIMIBE 10 MG PO TABS: 10.0000 mg | ORAL_TABLET | Freq: Every day | ORAL | 3 refills | Status: DC

## 2024-05-12 MED ORDER — ISOSORBIDE MONONITRATE ER 30 MG PO TB24: 30.0000 mg | ORAL_TABLET | Freq: Every evening | ORAL | 3 refills | Status: AC

## 2024-05-12 NOTE — Patient Instructions (Addendum)
Medication Instructions:  °Please start  °zetia 10 mg daily for cholesterol ° °If you need a refill on your cardiac medications before your next appointment, please call your pharmacy.  ° °Lab work: °No new labs needed ° °Testing/Procedures: °No new testing needed ° °Follow-Up: °At [facility], you and your health needs are our priority.  As part of our continuing mission to provide you with exceptional heart care, we have created designated Provider Care Teams.  These Care Teams include your primary Cardiologist (physician) and Advanced Practice Providers (APPs -  Physician Assistants and Nurse Practitioners) who all work together to provide you with the care you need, when you need it. ° °You will need a follow up appointment in 12 months ° °Providers on your designated Care Team:   °Christopher Berge, NP °Ryan Dunn, PA-C °Cadence Furth, PA-C ° °COVID-19 Vaccine Information can be found at: [email or web address] For questions related to vaccine distribution or appointments, please email [email or web address] or call [phone].  ° °

## 2024-06-08 ENCOUNTER — Ambulatory Visit: Admitting: Dermatology

## 2024-06-14 ENCOUNTER — Ambulatory Visit: Admitting: Dermatology

## 2024-06-20 ENCOUNTER — Ambulatory Visit: Admitting: Dermatology

## 2024-06-26 DIAGNOSIS — N1832 Chronic kidney disease, stage 3b: Secondary | ICD-10-CM | POA: Diagnosis not present

## 2024-07-17 DIAGNOSIS — H6123 Impacted cerumen, bilateral: Secondary | ICD-10-CM | POA: Diagnosis not present

## 2024-07-17 DIAGNOSIS — H902 Conductive hearing loss, unspecified: Secondary | ICD-10-CM | POA: Diagnosis not present

## 2024-08-14 ENCOUNTER — Encounter: Payer: Self-pay | Admitting: Dermatology

## 2024-08-14 ENCOUNTER — Ambulatory Visit: Admitting: Dermatology

## 2024-08-14 DIAGNOSIS — Z79899 Other long term (current) drug therapy: Secondary | ICD-10-CM

## 2024-08-14 DIAGNOSIS — L219 Seborrheic dermatitis, unspecified: Secondary | ICD-10-CM | POA: Diagnosis not present

## 2024-08-14 DIAGNOSIS — L820 Inflamed seborrheic keratosis: Secondary | ICD-10-CM | POA: Diagnosis not present

## 2024-08-14 DIAGNOSIS — L578 Other skin changes due to chronic exposure to nonionizing radiation: Secondary | ICD-10-CM | POA: Diagnosis not present

## 2024-08-14 DIAGNOSIS — Z7189 Other specified counseling: Secondary | ICD-10-CM

## 2024-08-14 DIAGNOSIS — D692 Other nonthrombocytopenic purpura: Secondary | ICD-10-CM | POA: Diagnosis not present

## 2024-08-14 DIAGNOSIS — L570 Actinic keratosis: Secondary | ICD-10-CM | POA: Diagnosis not present

## 2024-08-14 DIAGNOSIS — W908XXA Exposure to other nonionizing radiation, initial encounter: Secondary | ICD-10-CM | POA: Diagnosis not present

## 2024-08-14 MED ORDER — HYDROCORTISONE 2.5 % EX CREA: TOPICAL_CREAM | CUTANEOUS | 6 refills | Status: AC

## 2024-08-14 NOTE — Patient Instructions (Addendum)
 Cryotherapy Aftercare  Wash gently with soap and water everyday.   Apply Vaseline and Band-Aid daily until healed.    Seborrheic Dermatitis  -  is a chronic persistent rash characterized by pinkness and scaling most commonly of the mid face but also can occur on the scalp (dandruff), ears; mid chest, mid back and groin.  It tends to be exacerbated by stress and cooler weather.  People who have neurologic disease may experience new onset or exacerbation of existing seborrheic dermatitis.  The condition is not curable but treatable and can be controlled.   Due to recent changes in healthcare laws, you may see results of your pathology and/or laboratory studies on MyChart before the doctors have had a chance to review them. We understand that in some cases there may be results that are confusing or concerning to you. Please understand that not all results are received at the same time and often the doctors may need to interpret multiple results in order to provide you with the best plan of care or course of treatment. Therefore, we ask that you please give us  2 business days to thoroughly review all your results before contacting the office for clarification. Should we see a critical lab result, you will be contacted sooner.   If You Need Anything After Your Visit  If you have any questions or concerns for your doctor, please call our main line at [phone] and press option 4 to reach your doctor's medical assistant. If no one answers, please leave a voicemail as directed and we will return your call as soon as possible. Messages left after 4 pm will be answered the following business day.   You may also send us  a message via MyChart. We typically respond to MyChart messages within 1-2 business days.  For prescription refills, please ask your pharmacy to contact our office. Our fax number is [phone].  If you have an urgent issue when the clinic is closed that cannot wait until the next  business day, you can page your doctor at the number below.    Please note that while we do our best to be available for urgent issues outside of office hours, we are not available 24/7.   If you have an urgent issue and are unable to reach us , you may choose to seek medical care at your doctor's office, retail clinic, urgent care center, or emergency room.  If you have a medical emergency, please immediately call 911 or go to the emergency department.  Pager Numbers  - Dr. Hester: [phone]  - Dr. Jackquline: [phone]  - Dr. Claudene: [phone]   - Dr. Raymund: [phone]  In the event of inclement weather, please call our main line at [phone] for an update on the status of any delays or closures.  Dermatology Medication Tips: Please keep the boxes that topical medications come in in order to help keep track of the instructions about where and how to use these. Pharmacies typically print the medication instructions only on the boxes and not directly on the medication tubes.   If your medication is too expensive, please contact our office at [phone] option 4 or send us  a message through MyChart.   We are unable to tell what your co-pay for medications will be in advance as this is different depending on your insurance coverage. However, we may be able to find a substitute medication at lower cost or fill out paperwork to get insurance to cover a needed medication.  If a prior authorization is required to get your medication covered by your insurance company, please allow us  1-2 business days to complete this process.  Drug prices often vary depending on where the prescription is filled and some pharmacies may offer cheaper prices.  The website www.goodrx.com contains coupons for medications through different pharmacies. The prices here do not account for what the cost may be with help from insurance (it may be cheaper with your insurance), but the website can give you  the price if you did not use any insurance.  - You can print the associated coupon and take it with your prescription to the pharmacy.  - You may also stop by our office during regular business hours and pick up a GoodRx coupon card.  - If you need your prescription sent electronically to a different pharmacy, notify our office through [facility] or by phone at [phone] option 4.     Si Usted Necesita Algo Despus de Su Visita  Tambin puede enviarnos un mensaje a travs de Clinical cytogeneticist. Por lo general respondemos a los mensajes de MyChart en el transcurso de 1 a 2 das hbiles.  Para renovar recetas, por favor pida a su farmacia que se ponga en contacto con nuestra oficina. Randi lakes de fax es [place] [phone].  Si tiene un asunto urgente cuando la clnica est cerrada y que no puede esperar hasta el siguiente da hbil, puede llamar/localizar a su doctor(a) al nmero que aparece a continuacin.   Por favor, tenga en cuenta que aunque hacemos todo lo posible para estar disponibles para asuntos urgentes fuera del horario de [place], no estamos disponibles las 24 horas del da, los 7 [address]  [address] de la [place].   Si tiene un problema urgente y no puede comunicarse con nosotros, puede optar por buscar atencin mdica  en el consultorio de su doctor(a), en una clnica privada, en un centro de atencin urgente o en una sala de emergencias.  Si tiene Engineer, drilling, por favor llame inmediatamente al 911 o vaya a la sala de emergencias.  Nmeros de bper  - Dr. Hester: [phone]  - Dra. Jackquline: [phone]  - Dr. Claudene: [phone]  - Dra. Kitts: [phone]  En caso de inclemencias del [place], por favor llame a nuestra lnea principal al [phone] para una actualizacin sobre el estado de cualquier retraso o cierre.  Consejos para la medicacin en dermatologa: Por favor, guarde las cajas en las que vienen los medicamentos de uso tpico para ayudarle a seguir las  instrucciones sobre dnde y cmo usarlos. Las farmacias generalmente imprimen las instrucciones del medicamento slo en las cajas y no directamente en los tubos del [place].   Si su medicamento es muy caro, por favor, pngase en contacto con landry rieger llamando al [phone] y presione la opcin 4 o envenos un mensaje a travs de Clinical cytogeneticist.   No podemos decirle cul ser su copago por los medicamentos por adelantado ya que esto es diferente dependiendo de la cobertura de su seguro. Sin embargo, es posible que podamos encontrar un medicamento sustituto a Audiological scientist un formulario para que el seguro cubra el medicamento que se considera necesario.   Si se requiere una autorizacin previa para que su compaa de seguros malta su medicamento, por favor permtanos de 1 a 2 das hbiles para completar este proceso.  Los precios de los medicamentos varan con frecuencia dependiendo del Environmental consultant de dnde se surte la receta y alguna farmacias pueden ofrecer precios ms baratos.  El sitio web www.goodrx.com tiene cupones para medicamentos de Health and safety inspector. Los precios aqu no tienen en cuenta lo que podra costar con la ayuda del seguro (puede ser ms barato con su seguro), pero el sitio web puede darle el precio si no utiliz Tourist information centre manager.  - Puede imprimir el cupn correspondiente y llevarlo con su receta a la farmacia.  - Tambin puede pasar por nuestra oficina durante el horario de atencin regular y Education officer, museum una tarjeta de cupones de GoodRx.  - Si necesita que su receta se enve electrnicamente a una farmacia diferente, informe a nuestra oficina a travs de MyChart de [place] o por telfono llamando al [phone] y presione la opcin 4.

## 2024-08-14 NOTE — Progress Notes (Signed)
 Follow-Up Visit   Subjective  Daniel Lewis is a 89 y.o. male who presents for the following: AK f/u, face The patient has spots, moles and lesions to be evaluated, some may be new or changing and the patient may have concern these could be cancer.  The following portions of the chart were reviewed this encounter and updated as appropriate: medications, allergies, medical history  Review of Systems:  No other skin or systemic complaints except as noted in HPI or Assessment and Plan.  Objective  Well appearing patient in no apparent distress; mood and affect are within normal limits.   A focused examination was performed of the following areas: Face, scalp, arms  Relevant exam findings are noted in the Assessment and Plan.  scalp, face x 18 (18) Pink scaly macules forehead x 1 Stuck on waxy paps with erythema  Assessment & Plan   Purpura - Chronic; persistent and recurrent.  Treatable, but not curable. - Violaceous macules and patches - Benign - Related to trauma, age, sun damage and/or use of blood thinners, chronic use of topical and/or oral steroids - Observe - Can use OTC arnica containing moisturizer such as Dermend Bruise Formula if desired - Call for worsening or other concerns  ACTINIC DAMAGE - chronic, secondary to cumulative UV radiation exposure/sun exposure over time - diffuse scaly erythematous macules with underlying dyspigmentation - Recommend daily broad spectrum sunscreen SPF 30+ to sun-exposed areas, reapply every 2 hours as needed.  - Recommend staying in the shade or wearing long sleeves, sun glasses (UVA+UVB protection) and wide brim hats (4-inch brim around the entire circumference of the hat). - Call for new or changing lesions.    SEBORRHEIC DERMATITIS glabella Exam: Pink patches with greasy scale at glabella Chronic and persistent condition with duration or expected duration over one year. Condition is bothersome/symptomatic for patient.  Currently flared. Seborrheic Dermatitis is a chronic persistent rash characterized by pinkness and scaling most commonly of the mid face but also can occur on the scalp (dandruff), ears; mid chest, mid back and groin.  It tends to be exacerbated by stress and cooler weather.  People who have neurologic disease may experience new onset or exacerbation of existing seborrheic dermatitis.  The condition is not curable but treatable and can be controlled. Treatment Plan: Start HC 2.5% cr 3 nights a week  Topical steroids (such as triamcinolone, fluocinolone, fluocinonide, mometasone, clobetasol, halobetasol, betamethasone , hydrocortisone ) can cause thinning and lightening of the skin if they are used for too long in the same area. Your physician has selected the right strength medicine for your problem and area affected on the body. Please use your medication only as directed by your physician to prevent side effects.     AK (ACTINIC KERATOSIS) (18) scalp, face x 18 (18) Actinic keratoses are precancerous spots that appear secondary to cumulative UV radiation exposure/sun exposure over time. They are chronic with expected duration over 1 year. A portion of actinic keratoses will progress to squamous cell carcinoma of the skin. It is not possible to reliably predict which spots will progress to skin cancer and so treatment is recommended to prevent development of skin cancer.  Recommend daily broad spectrum sunscreen SPF 30+ to sun-exposed areas, reapply every 2 hours as needed.  Recommend staying in the shade or wearing long sleeves, sun glasses (UVA+UVB protection) and wide brim hats (4-inch brim around the entire circumference of the hat). Call for new or changing lesions. Destruction of lesion - scalp, face  x 18 (18) Complexity: simple   Destruction method: cryotherapy   Informed consent: discussed and consent obtained   Timeout:  patient name, date of birth, surgical site, and procedure  verified Lesion destroyed using liquid nitrogen: Yes   Region frozen until ice ball extended beyond lesion: Yes   Outcome: patient tolerated procedure well with no complications   Post-procedure details: wound care instructions given    INFLAMED SEBORRHEIC KERATOSIS forehead x 1 Symptomatic, irritating, patient would like treated. Destruction of lesion - forehead x 1 Complexity: simple   Destruction method: cryotherapy   Informed consent: discussed and consent obtained   Timeout:  patient name, date of birth, surgical site, and procedure verified Lesion destroyed using liquid nitrogen: Yes   Region frozen until ice ball extended beyond lesion: Yes   Outcome: patient tolerated procedure well with no complications   Post-procedure details: wound care instructions given     Return in about 8 months (around 04/13/2025) for UBSE, AK f/u, Seb derm f/u.  I, Grayce Saunas, RMA, am acting as scribe for Alm Rhyme, MD .   Documentation: I have reviewed the above documentation for accuracy and completeness, and I agree with the above.  Alm Rhyme, MD

## 2024-12-26 ENCOUNTER — Emergency Department

## 2024-12-26 ENCOUNTER — Other Ambulatory Visit: Payer: Self-pay

## 2024-12-26 ENCOUNTER — Encounter: Admission: EM | Disposition: A | Payer: Self-pay | Source: Home / Self Care

## 2024-12-26 ENCOUNTER — Emergency Department: Admitting: Anesthesiology

## 2024-12-26 ENCOUNTER — Observation Stay: Admission: EM | Admit: 2024-12-26 | Discharge: 2024-12-28 | DRG: 660 | Disposition: A | Source: Home / Self Care

## 2024-12-26 ENCOUNTER — Ambulatory Visit
Admission: EM | Admit: 2024-12-26 | Discharge: 2024-12-26 | Disposition: A | Discharge status: Home / Self Care | Source: Home / Self Care

## 2024-12-26 DIAGNOSIS — J9811 Atelectasis: Secondary | ICD-10-CM | POA: Diagnosis present

## 2024-12-26 DIAGNOSIS — Q600 Renal agenesis, unilateral: Secondary | ICD-10-CM

## 2024-12-26 DIAGNOSIS — E785 Hyperlipidemia, unspecified: Secondary | ICD-10-CM | POA: Diagnosis present

## 2024-12-26 DIAGNOSIS — Z961 Presence of intraocular lens: Secondary | ICD-10-CM | POA: Diagnosis present

## 2024-12-26 DIAGNOSIS — Z79890 Hormone replacement therapy (postmenopausal): Secondary | ICD-10-CM

## 2024-12-26 DIAGNOSIS — K635 Polyp of colon: Secondary | ICD-10-CM | POA: Insufficient documentation

## 2024-12-26 DIAGNOSIS — I252 Old myocardial infarction: Secondary | ICD-10-CM

## 2024-12-26 DIAGNOSIS — Z8546 Personal history of malignant neoplasm of prostate: Secondary | ICD-10-CM

## 2024-12-26 DIAGNOSIS — Z9049 Acquired absence of other specified parts of digestive tract: Secondary | ICD-10-CM

## 2024-12-26 DIAGNOSIS — Z9841 Cataract extraction status, right eye: Secondary | ICD-10-CM

## 2024-12-26 DIAGNOSIS — N201 Calculus of ureter: Secondary | ICD-10-CM

## 2024-12-26 DIAGNOSIS — N132 Hydronephrosis with renal and ureteral calculous obstruction: Secondary | ICD-10-CM | POA: Diagnosis not present

## 2024-12-26 DIAGNOSIS — E039 Hypothyroidism, unspecified: Secondary | ICD-10-CM | POA: Diagnosis present

## 2024-12-26 DIAGNOSIS — Z9842 Cataract extraction status, left eye: Secondary | ICD-10-CM

## 2024-12-26 DIAGNOSIS — N178 Other acute kidney failure: Secondary | ICD-10-CM | POA: Diagnosis not present

## 2024-12-26 DIAGNOSIS — N200 Calculus of kidney: Secondary | ICD-10-CM

## 2024-12-26 DIAGNOSIS — Z7982 Long term (current) use of aspirin: Secondary | ICD-10-CM

## 2024-12-26 DIAGNOSIS — I160 Hypertensive urgency: Secondary | ICD-10-CM | POA: Diagnosis not present

## 2024-12-26 DIAGNOSIS — I2510 Atherosclerotic heart disease of native coronary artery without angina pectoris: Secondary | ICD-10-CM | POA: Diagnosis present

## 2024-12-26 DIAGNOSIS — K219 Gastro-esophageal reflux disease without esophagitis: Secondary | ICD-10-CM | POA: Diagnosis present

## 2024-12-26 DIAGNOSIS — R1031 Right lower quadrant pain: Secondary | ICD-10-CM | POA: Diagnosis not present

## 2024-12-26 DIAGNOSIS — E782 Mixed hyperlipidemia: Secondary | ICD-10-CM

## 2024-12-26 DIAGNOSIS — Z79899 Other long term (current) drug therapy: Secondary | ICD-10-CM

## 2024-12-26 DIAGNOSIS — E875 Hyperkalemia: Secondary | ICD-10-CM | POA: Diagnosis present

## 2024-12-26 DIAGNOSIS — N179 Acute kidney failure, unspecified: Principal | ICD-10-CM | POA: Diagnosis present

## 2024-12-26 DIAGNOSIS — N390 Urinary tract infection, site not specified: Secondary | ICD-10-CM | POA: Diagnosis present

## 2024-12-26 DIAGNOSIS — R972 Elevated prostate specific antigen [PSA]: Secondary | ICD-10-CM | POA: Insufficient documentation

## 2024-12-26 DIAGNOSIS — I129 Hypertensive chronic kidney disease with stage 1 through stage 4 chronic kidney disease, or unspecified chronic kidney disease: Secondary | ICD-10-CM | POA: Diagnosis present

## 2024-12-26 DIAGNOSIS — Z9079 Acquired absence of other genital organ(s): Secondary | ICD-10-CM

## 2024-12-26 DIAGNOSIS — N23 Unspecified renal colic: Secondary | ICD-10-CM

## 2024-12-26 DIAGNOSIS — I10 Essential (primary) hypertension: Secondary | ICD-10-CM | POA: Diagnosis present

## 2024-12-26 DIAGNOSIS — N183 Chronic kidney disease, stage 3 unspecified: Secondary | ICD-10-CM | POA: Diagnosis present

## 2024-12-26 DIAGNOSIS — N136 Pyonephrosis: Principal | ICD-10-CM | POA: Diagnosis present

## 2024-12-26 LAB — LIPASE, BLOOD: Lipase: 49 U/L (ref 11–51)

## 2024-12-26 LAB — CBC
HCT: 38.7 % — ABNORMAL LOW (ref 39.0–52.0)
Hemoglobin: 12.8 g/dL — ABNORMAL LOW (ref 13.0–17.0)
MCH: 31.5 pg (ref 26.0–34.0)
MCHC: 33.1 g/dL (ref 30.0–36.0)
MCV: 95.3 fL (ref 80.0–100.0)
Platelets: 226 10*3/uL (ref 150–400)
RBC: 4.06 MIL/uL — ABNORMAL LOW (ref 4.22–5.81)
RDW: 12.4 % (ref 11.5–15.5)
WBC: 10.8 10*3/uL — ABNORMAL HIGH (ref 4.0–10.5)
nRBC: 0 % (ref 0.0–0.2)

## 2024-12-26 LAB — BASIC METABOLIC PANEL WITH GFR
Anion gap: 15 (ref 5–15)
BUN: 47 mg/dL — ABNORMAL HIGH (ref 8–23)
CO2: 23 mmol/L (ref 22–32)
Calcium: 9 mg/dL (ref 8.9–10.3)
Chloride: 100 mmol/L (ref 98–111)
Creatinine, Ser: 6.5 mg/dL — ABNORMAL HIGH (ref 0.61–1.24)
GFR, Estimated: 8 mL/min — ABNORMAL LOW
Glucose, Bld: 103 mg/dL — ABNORMAL HIGH (ref 70–99)
Potassium: 4.9 mmol/L (ref 3.5–5.1)
Sodium: 138 mmol/L (ref 135–145)

## 2024-12-26 LAB — HEPATIC FUNCTION PANEL
ALT: 16 U/L (ref 0–44)
AST: 15 U/L (ref 15–41)
Albumin: 3.8 g/dL (ref 3.5–5.0)
Alkaline Phosphatase: 96 U/L (ref 38–126)
Bilirubin, Direct: 0.2 mg/dL (ref 0.0–0.2)
Indirect Bilirubin: 0.3 mg/dL (ref 0.3–0.9)
Total Bilirubin: 0.4 mg/dL (ref 0.0–1.2)
Total Protein: 6.4 g/dL — ABNORMAL LOW (ref 6.5–8.1)

## 2024-12-26 LAB — POCT URINE DIPSTICK
Bilirubin, UA: NEGATIVE
Glucose, UA: NEGATIVE mg/dL
Ketones, POC UA: NEGATIVE mg/dL
Leukocytes, UA: NEGATIVE
Nitrite, UA: NEGATIVE
Protein Ur, POC: NEGATIVE mg/dL
Spec Grav, UA: <=1.005 — AB
Urobilinogen, UA: 0.2 U/dL
pH, UA: 5.5

## 2024-12-26 LAB — URINALYSIS, ROUTINE W REFLEX MICROSCOPIC
Bacteria, UA: NONE SEEN
Bilirubin Urine: NEGATIVE
Glucose, UA: NEGATIVE mg/dL
Ketones, ur: NEGATIVE mg/dL
Nitrite: NEGATIVE
Protein, ur: NEGATIVE mg/dL
Specific Gravity, Urine: 1.005 (ref 1.005–1.030)
pH: 6 (ref 5.0–8.0)

## 2024-12-26 LAB — LACTIC ACID, PLASMA: Lactic Acid, Venous: 0.8 mmol/L (ref 0.5–1.9)

## 2024-12-26 LAB — BLOOD GAS, VENOUS

## 2024-12-26 MED ORDER — ACETAMINOPHEN 650 MG RE SUPP: 650.0000 mg | Freq: Four times a day (QID) | RECTAL | Status: DC | PRN

## 2024-12-26 MED ORDER — PROPOFOL 10 MG/ML IV BOLUS
INTRAVENOUS | Status: AC
  Filled 2024-12-26: qty 40

## 2024-12-26 MED ORDER — SODIUM CHLORIDE 0.9 % IV SOLN: INTRAVENOUS | Status: DC | PRN

## 2024-12-26 MED ORDER — OXYCODONE HCL 5 MG PO TABS: 5.0000 mg | ORAL_TABLET | Freq: Once | ORAL | Status: DC | PRN

## 2024-12-26 MED ORDER — ONDANSETRON HCL 4 MG/2ML IJ SOLN
INTRAMUSCULAR | Status: DC | PRN
  Administered 2024-12-26: 4 mg via INTRAVENOUS

## 2024-12-26 MED ORDER — FENTANYL CITRATE (PF) 100 MCG/2ML IJ SOLN
INTRAMUSCULAR | Status: AC
  Filled 2024-12-26: qty 2

## 2024-12-26 MED ORDER — DEXAMETHASONE SOD PHOSPHATE PF 10 MG/ML IJ SOLN
INTRAMUSCULAR | Status: DC | PRN
  Administered 2024-12-26: 8 mg via INTRAVENOUS

## 2024-12-26 MED ORDER — ATORVASTATIN CALCIUM 20 MG PO TABS
20.0000 mg | ORAL_TABLET | Freq: Every day | ORAL | Status: DC
  Administered 2024-12-27 – 2024-12-28 (×2): 20 mg via ORAL
  Filled 2024-12-26 (×2): qty 1

## 2024-12-26 MED ORDER — FENTANYL CITRATE (PF) 100 MCG/2ML IJ SOLN: 25.0000 ug | INTRAMUSCULAR | Status: DC | PRN

## 2024-12-26 MED ORDER — PANTOPRAZOLE SODIUM 40 MG PO TBEC
40.0000 mg | DELAYED_RELEASE_TABLET | Freq: Every day | ORAL | Status: DC
  Administered 2024-12-27 – 2024-12-28 (×2): 40 mg via ORAL
  Filled 2024-12-26 (×2): qty 1

## 2024-12-26 MED ORDER — DEXAMETHASONE SOD PHOSPHATE PF 10 MG/ML IJ SOLN
INTRAMUSCULAR | Status: AC
  Filled 2024-12-26: qty 1

## 2024-12-26 MED ORDER — HEPARIN SODIUM (PORCINE) 5000 UNIT/ML IJ SOLN
5000.0000 [IU] | Freq: Three times a day (TID) | INTRAMUSCULAR | Status: DC
  Administered 2024-12-26 – 2024-12-28 (×5): 5000 [IU] via SUBCUTANEOUS
  Filled 2024-12-26 (×5): qty 1

## 2024-12-26 MED ORDER — SODIUM CHLORIDE 0.9 % IV BOLUS
1000.0000 mL | Freq: Once | INTRAVENOUS | Status: AC
  Administered 2024-12-26: 1000 mL via INTRAVENOUS

## 2024-12-26 MED ORDER — FENTANYL CITRATE (PF) 100 MCG/2ML IJ SOLN
INTRAMUSCULAR | Status: DC | PRN
  Administered 2024-12-26: 25 ug via INTRAVENOUS

## 2024-12-26 MED ORDER — ISOSORBIDE MONONITRATE ER 30 MG PO TB24
30.0000 mg | ORAL_TABLET | Freq: Every evening | ORAL | Status: DC
  Administered 2024-12-27: 30 mg via ORAL
  Filled 2024-12-26: qty 1

## 2024-12-26 MED ORDER — LOSARTAN POTASSIUM 50 MG PO TABS
50.0000 mg | ORAL_TABLET | Freq: Every day | ORAL | Status: DC
  Administered 2024-12-27 – 2024-12-28 (×2): 50 mg via ORAL
  Filled 2024-12-26 (×2): qty 1

## 2024-12-26 MED ORDER — LIDOCAINE HCL URETHRAL/MUCOSAL 2 % EX GEL: 1.0000 | Freq: Once | CUTANEOUS | Status: DC

## 2024-12-26 MED ORDER — LIDOCAINE HCL (PF) 2 % IJ SOLN
INTRAMUSCULAR | Status: AC
  Filled 2024-12-26: qty 5

## 2024-12-26 MED ORDER — EZETIMIBE 10 MG PO TABS: 10.0000 mg | ORAL_TABLET | Freq: Every day | ORAL | Status: DC

## 2024-12-26 MED ORDER — CEFAZOLIN SODIUM 1 G IJ SOLR
INTRAMUSCULAR | Status: AC
  Filled 2024-12-26: qty 20

## 2024-12-26 MED ORDER — OXYCODONE HCL 5 MG/5ML PO SOLN: 5.0000 mg | Freq: Once | ORAL | Status: DC | PRN

## 2024-12-26 MED ORDER — LIDOCAINE HCL (PF) 2 % IJ SOLN
INTRAMUSCULAR | Status: DC | PRN
  Administered 2024-12-26: 60 mg via INTRADERMAL

## 2024-12-26 MED ORDER — PROPOFOL 500 MG/50ML IV EMUL
INTRAVENOUS | Status: DC | PRN
  Administered 2024-12-26: 50 ug/kg/min via INTRAVENOUS

## 2024-12-26 MED ORDER — ONDANSETRON HCL 4 MG/2ML IJ SOLN: 4.0000 mg | Freq: Four times a day (QID) | INTRAMUSCULAR | Status: DC | PRN

## 2024-12-26 MED ORDER — ACETAMINOPHEN 325 MG PO TABS: 650.0000 mg | ORAL_TABLET | Freq: Four times a day (QID) | ORAL | Status: DC | PRN

## 2024-12-26 MED ORDER — ONDANSETRON HCL 4 MG PO TABS: 4.0000 mg | ORAL_TABLET | Freq: Four times a day (QID) | ORAL | Status: DC | PRN

## 2024-12-26 MED ORDER — OXYCODONE HCL 5 MG PO TABS
5.0000 mg | ORAL_TABLET | ORAL | Status: DC | PRN
  Administered 2024-12-26: 5 mg via ORAL
  Filled 2024-12-26: qty 1

## 2024-12-26 MED ORDER — IOHEXOL 180 MG/ML  SOLN
INTRAMUSCULAR | Status: DC | PRN
  Administered 2024-12-26: 10 mL

## 2024-12-26 MED ORDER — ONDANSETRON HCL 4 MG/2ML IJ SOLN
INTRAMUSCULAR | Status: AC
  Filled 2024-12-26: qty 2

## 2024-12-26 MED ORDER — DROPERIDOL 2.5 MG/ML IJ SOLN: 0.6250 mg | Freq: Once | INTRAMUSCULAR | Status: DC | PRN

## 2024-12-26 MED ORDER — CEFAZOLIN SODIUM-DEXTROSE 2-3 GM-%(50ML) IV SOLR
INTRAVENOUS | Status: DC | PRN
  Administered 2024-12-26: 2 g via INTRAVENOUS

## 2024-12-26 MED ORDER — PROPOFOL 10 MG/ML IV BOLUS
INTRAVENOUS | Status: DC | PRN
  Administered 2024-12-26: 60 mg via INTRAVENOUS

## 2024-12-26 MED ORDER — LEVOTHYROXINE SODIUM 50 MCG PO TABS
75.0000 ug | ORAL_TABLET | Freq: Every day | ORAL | Status: DC
  Administered 2024-12-27 – 2024-12-28 (×2): 75 ug via ORAL
  Filled 2024-12-26 (×2): qty 2

## 2024-12-26 MED ORDER — HYDROMORPHONE HCL 1 MG/ML IJ SOLN: 0.5000 mg | INTRAMUSCULAR | Status: DC | PRN

## 2024-12-26 MED ORDER — ASPIRIN 81 MG PO TBEC
81.0000 mg | DELAYED_RELEASE_TABLET | Freq: Every day | ORAL | Status: DC
  Administered 2024-12-27 – 2024-12-28 (×2): 81 mg via ORAL
  Filled 2024-12-26 (×2): qty 1

## 2024-12-26 NOTE — Discharge Instructions (Addendum)
 Go to the emergency department for evaluation of your right lower abdominal pain and extremely high blood pressure.   Blood pressure: 214/94  Repeat 189/89

## 2024-12-26 NOTE — ED Provider Notes (Signed)
 " [facility]    CSN: [id number] Arrival date & time: 12/26/24  1119      History   Chief Complaint Chief Complaint  Patient presents with   Urinary Frequency    HPI Daniel Lewis is a 89 y.o. male.  Accompanied by his wife, patient presents with right lower abdominal pain, urinary frequency, urinary urgency x 3 days.  He states his lower abdomen is swollen.  The abdominal pain had improved somewhat but then became worse again yesterday.  He is concerned for a bladder infection.  He denies fever, chills, dysuria, hematuria, flank pain, vomiting, diarrhea, chest pain, shortness of breath.  Last bowel movement this morning with no concerns.  No OTC medication taken today.  His medical history includes single kidney, CKD, kidney stone, prostate cancer, small bowel obstruction, hypertension.  The history is provided by the patient, the spouse and medical records.    Past Medical History:  Diagnosis Date   Actinic keratoses    Anemia    ASHD (arteriosclerotic heart disease)    Chronic kidney disease    Erectile dysfunction    GERD (gastroesophageal reflux disease)    Hyperlipidemia    Hypertension    Hypothyroidism    MI (myocardial infarction) (HCC)    Prostate cancer (HCC)    prostate cancer   Vertigo     Patient Active Problem List   Diagnosis Date Noted   Colon polyp 12/26/2024   Elevated PSA 12/26/2024   Stage 3b chronic kidney disease (HCC) 04/29/2023   Prediabetes 03/18/2023   Aortic atherosclerosis 08/16/2020   Preop cardiovascular exam 05/20/2020   Protein-calorie malnutrition, severe 12/05/2018   Small bowel obstruction (HCC) 11/28/2018   Acquired trigger finger of right ring finger 06/06/2018   Pain in finger of right hand 06/06/2018   Pain in joint of left shoulder 01/04/2018   Anemia 04/10/2014   ASHD (arteriosclerotic heart disease) 04/10/2014   Essential (primary) hypertension 04/10/2014   Hyperlipidemia 04/10/2014   Hypothyroidism  04/10/2014   Obstruction to urinary outflow 09/18/2013   Personal history of prostate cancer 09/18/2013   Congenital malformation of kidney, unspecified 08/10/2012   ED (erectile dysfunction) of organic origin 08/10/2012   Incomplete emptying of bladder 08/10/2012   Increased frequency of urination 08/10/2012   Kidney stone 08/10/2012   Malignant neoplasm of prostate (HCC) 08/10/2012   Male urinary stress incontinence 08/10/2012   Retention of urine 08/10/2012    Past Surgical History:  Procedure Laterality Date   CATARACT EXTRACTION W/PHACO Left 07/23/2021   Procedure: CATARACT EXTRACTION PHACO AND INTRAOCULAR LENS PLACEMENT (IOC) LEFT TORIC  8.27 01:12.2 LENS [place] goniotomy;  Surgeon: Mittie Gaskin, MD;  Location: [facility];  Service: Ophthalmology;  Laterality: Left;   CATARACT EXTRACTION W/PHACO Right 08/13/2021   Procedure: CATARACT EXTRACTION PHACO AND INTRAOCULAR LENS PLACEMENT (IOC) RIGHT TORIC LENS [place] dual blade goniotomy;  Surgeon: Mittie Gaskin, MD;  Location: [facility];  Service: Ophthalmology;  Laterality: Right;  10.08 01:26.6   CHOLECYSTECTOMY     COLONOSCOPY     COLONOSCOPY WITH PROPOFOL  N/A 12/16/2015   Procedure: COLONOSCOPY WITH PROPOFOL ;  Surgeon: Lamar ONEIDA Holmes, MD;  Location: [facility];  Service: Endoscopy;  Laterality: N/A;   INGUINAL HERNIA REPAIR     LAPAROTOMY N/A 12/02/2018   Procedure: EXPLORATORY LAPAROTOMY, LYSIS OF ADHESIONS, POSSIBLE SBO;  Surgeon: Jordis Laneta FALCON, MD;  Location: [facility];  Service: General;  Laterality: N/A;   PROSTATECTOMY     XI ROBOTIC ASSISTED  INGUINAL HERNIA REPAIR WITH MESH Left 06/18/2020   Procedure: XI ROBOTIC ASSISTED INGUINAL HERNIA REPAIR WITH MESH, possible bilateral;  Surgeon: Jordis Laneta FALCON, MD;  Location: [facility];  Service: General;  Laterality: Left;       Home Medications    Prior to Admission medications  Medication Sig Start Date End Date Taking? Authorizing Provider   Ascorbic Acid (VITAMIN C ) 500 MG CAPS Take 500 mg by mouth daily.     [provider]  aspirin  EC 81 MG tablet Take 81 mg by mouth daily.     [provider]  atorvastatin  (LIPITOR) 20 MG tablet Take 1 tablet (20 mg total) by mouth daily. 05/12/24   Gollan, Timothy J, MD  ezetimibe  (ZETIA ) 10 MG tablet Take 1 tablet (10 mg total) by mouth daily. 05/12/24 08/10/24  Gollan, Timothy J, MD  hydrocortisone  2.5 % cream Apply topically 3 (three) times a week. Apply to scaly area on face 3 nights a week, Monday, Wednesday, Friday for seborrheic dermatitis 08/14/24   Hester Alm BROCKS, MD  isosorbide  mononitrate (IMDUR ) 30 MG 24 hr tablet Take 1 tablet (30 mg total) by mouth every evening. 05/12/24   Gollan, Timothy J, MD  losartan  (COZAAR ) 50 MG tablet Take 1 tablet (50 mg total) by mouth daily. 05/12/24   Gollan, Timothy J, MD  Omega 3-6-9 Fatty Acids (OMEGA 3-6-9 COMPLEX PO) Take 1 capsule by mouth daily.     [provider]  OVER THE COUNTER MEDICATION Take 2 capsules by mouth daily. Multi Greens     [provider]  pantoprazole  (PROTONIX ) 40 MG tablet Take 40 mg by mouth daily.    [provider]  SYNTHROID  75 MCG tablet Take 75 mcg by mouth daily before breakfast.  08/28/15   [provider]  VITAMIN D, CHOLECALCIFEROL, PO Take 1 tablet by mouth daily.     [provider]  zinc gluconate 50 MG tablet Take 50 mg by mouth daily.     [provider]    Family History History reviewed. No pertinent family history.  Social History Social History[1]   Allergies   Patient has no known allergies.   Review of Systems Review of Systems  Constitutional:  Negative for chills and fever.  Respiratory:  Negative for cough and shortness of breath.   Cardiovascular:  Negative for chest pain and palpitations.  Gastrointestinal:  Positive for abdominal pain. Negative for constipation, diarrhea, nausea and vomiting.  Genitourinary:   Positive for frequency. Negative for dysuria, flank pain and hematuria.     Physical Exam Triage Vital Signs ED Triage Vitals  Encounter Vitals Group     BP 12/26/24 1307 (!) 214/94     Girls Systolic BP Percentile --      Girls Diastolic BP Percentile --      Boys Systolic BP Percentile --      Boys Diastolic BP Percentile --      Pulse Rate 12/26/24 1307 69     Resp --      Temp 12/26/24 1307 98.6 F (37 C)     Temp Source 12/26/24 1307 Oral     SpO2 12/26/24 1307 96 %     Weight --      Height --      Head Circumference --      Peak Flow --      Pain Score 12/26/24 1322 0     Pain Loc --      Pain Education --  Exclude from Growth Chart --    No data found.  Updated Vital Signs BP (S) (!) 189/89 (BP Location: Right Arm)   Pulse 69   Temp 98.6 F (37 C) (Oral)   SpO2 96%   Visual Acuity Right Eye Distance:   Left Eye Distance:   Bilateral Distance:    Right Eye Near:   Left Eye Near:    Bilateral Near:     Physical Exam Constitutional:      General: He is not in acute distress. HENT:     Mouth/Throat:     Mouth: Mucous membranes are moist.  Cardiovascular:     Rate and Rhythm: Normal rate and regular rhythm.     Heart sounds: Normal heart sounds.  Pulmonary:     Effort: Pulmonary effort is normal. No respiratory distress.     Breath sounds: Normal breath sounds.  Abdominal:     General: Bowel sounds are normal.     Palpations: Abdomen is soft.     Tenderness: There is abdominal tenderness. There is no right CVA tenderness, left CVA tenderness, guarding or rebound.     Comments: RLQ mildly tender to palpation.  Neurological:     Mental Status: He is alert.      UC Treatments / Results  Labs (all labs ordered are listed, but only abnormal results are displayed) Labs Reviewed  POCT URINE DIPSTICK - Abnormal; Notable for the following components:      Result Value   Color, UA light yellow (*)    Spec Grav, UA <=1.005 (*)    Blood, UA  moderate (*)    All other components within normal limits    EKG   Radiology No results found.  Procedures Procedures (including critical care time)  Medications Ordered in UC Medications - No data to display  Initial Impression / Assessment and Plan / UC Course  I have reviewed the triage vital signs and the nursing notes.  Pertinent labs & imaging results that were available during my care of the patient were reviewed by me and considered in my medical decision making (see chart for details).    Hypertensive urgency, right lower quadrant abdominal pain.  Blood pressure 214/94 on arrival; repeat 189/89.  Patient is mildly tender to palpation of his right lower quadrant.  His medical history includes single kidney, CKD, small bowel obstruction, hypertension.  Sending patient to the ED for evaluation of his abdominal pain and very high blood pressure.  He is accompanied by his wife who will take him to [facility] ED now.  Final Clinical Impressions(s) / UC Diagnoses   Final diagnoses:  Hypertensive urgency  Right lower quadrant abdominal pain     Discharge Instructions      Go to the emergency department for evaluation of your right lower abdominal pain and extremely high blood pressure.   Blood pressure: 214/94  Repeat 189/89       ED Prescriptions   None    PDMP not reviewed this encounter.    [1]  Social History Tobacco Use   Smoking status: Never   Smokeless tobacco: Never  Vaping Use   Vaping status: Never Used  Substance Use Topics   Alcohol use: No   Drug use: No     Corlis Burnard DEL, NP 12/26/24 1346  "

## 2024-12-26 NOTE — Consult Note (Signed)
 "  Urology Consult  Requesting physician: Kreg Moats, MD  Reason for consultation: Acute renal failure secondary to obstructing stone and a solitary kidney   Assessment/Recommendations: 89 y.o. male with acute renal failure secondary to an obstructing distal ureteral calculus and a solitary kidney.  Creatinine 6.5 (baseline 1.2); potassium normal CT findings and lab work were discussed and recommend urgent cystoscopy with right ureteral stent placement It was stressed only a stent will be placed and no attempt will be made to treat his urinary tract stones at this time.  Discussed follow-up ureteroscopy to treat his ureteral and renal calculi after his creatinine improves and the stent has been indwelling for at least 2 weeks to allow for passive dilation We discussed in a small percentage of cases stent placement is unsuccessful due to an impacted stone and if this were to occur he would need percutaneous nephrostomy tube placement We also discussed possibility of stent related symptoms including storage related voiding symptoms, bladder and kidney pain All questions were answered and he desires to proceed with stent placement   History of Present Illness: Daniel Lewis is a 89 y.o. who presented to [facility] complaining of right lower quadrant abdominal pain with urinary frequency and urgency x 3 days.  History of solitary kidney.  Eval in urgent care remarkable for a dipstick UA showing moderate blood and right lower quadrant abdominal tenderness.  BP was 214/94 and further evaluation in the ED was recommended. Evaluation in the ED remarkable for UA showing 6-10 RBCs and creatinine elevated 6.5 (1.2 06/26/2024); mild leukocytosis 10.8.  He was afebrile with an elevated BP CT abdomen pelvis without contrast showed a solitary right kidney with a 6 mm right distal ureteral calculus near the UVJ.  Also noted to have nonobstructing right renal calculi measuring up to 6 mm.  He  had mild-moderate right hydronephrosis with upstream hydroureter. ED staff unable to place Foley catheter.  History of prostate cancer with prior RALP 2009    Past Medical History:  Diagnosis Date   Actinic keratoses    Anemia    ASHD (arteriosclerotic heart disease)    Chronic kidney disease    Erectile dysfunction    GERD (gastroesophageal reflux disease)    Hyperlipidemia    Hypertension    Hypothyroidism    MI (myocardial infarction) (HCC)    Prostate cancer (HCC)    prostate cancer   Vertigo     Past Surgical History:  Procedure Laterality Date   CATARACT EXTRACTION W/PHACO Left 07/23/2021   Procedure: CATARACT EXTRACTION PHACO AND INTRAOCULAR LENS PLACEMENT (IOC) LEFT TORIC  8.27 01:12.2 LENS [place] goniotomy;  Surgeon: Mittie Gaskin, MD;  Location: [facility];  Service: Ophthalmology;  Laterality: Left;   CATARACT EXTRACTION W/PHACO Right 08/13/2021   Procedure: CATARACT EXTRACTION PHACO AND INTRAOCULAR LENS PLACEMENT (IOC) RIGHT TORIC LENS [place] dual blade goniotomy;  Surgeon: Mittie Gaskin, MD;  Location: [facility];  Service: Ophthalmology;  Laterality: Right;  10.08 01:26.6   CHOLECYSTECTOMY     COLONOSCOPY     COLONOSCOPY WITH PROPOFOL  N/A 12/16/2015   Procedure: COLONOSCOPY WITH PROPOFOL ;  Surgeon: Lamar ONEIDA Holmes, MD;  Location: [facility];  Service: Endoscopy;  Laterality: N/A;   INGUINAL HERNIA REPAIR     LAPAROTOMY N/A 12/02/2018   Procedure: EXPLORATORY LAPAROTOMY, LYSIS OF ADHESIONS, POSSIBLE SBO;  Surgeon: Jordis Laneta FALCON, MD;  Location: [facility];  Service: General;  Laterality: N/A;   PROSTATECTOMY     XI ROBOTIC  ASSISTED INGUINAL HERNIA REPAIR WITH MESH Left 06/18/2020   Procedure: XI ROBOTIC ASSISTED INGUINAL HERNIA REPAIR WITH MESH, possible bilateral;  Surgeon: Jordis Laneta FALCON, MD;  Location: [facility];  Service: General;  Laterality: Left;    Home Medications:  Active Medications[1]  Allergies: Allergies[2]  History  reviewed. No pertinent family history.  Social History:  reports that he has never smoked. He has never used smokeless tobacco. He reports that he does not drink alcohol and does not use drugs.  ROS: A complete review of systems was performed.  All systems are negative except for pertinent findings as noted.  Physical Exam:  Vital signs in last 24 hours: Temp:  [97.8 F (36.6 C)-98.9 F (37.2 C)] 98.9 F (37.2 C) (02/03 1926) Pulse Rate:  [69-76] 75 (02/03 1926) Resp:  [15-16] 16 (02/03 1926) BP: (176-214)/(88-94) 183/91 (02/03 1926) SpO2:  [96 %-100 %] 98 % (02/03 1926) Weight:  [63.6 kg] 63.6 kg (02/03 1416) Constitutional:  Alert and oriented, No acute distress HEENT: [place] AT, moist mucus membranes.  Trachea midline, no masses Cardiovascular: Regular rate and rhythm, no clubbing, cyanosis, or edema. Respiratory: Normal respiratory effort, lungs clear bilaterally GI: Abdomen is soft, nontender, nondistended, no abdominal masses GU: No CVA tenderness Skin: No rashes, bruises or suspicious lesions Lymph: No cervical or inguinal adenopathy Neurologic: Grossly intact, no focal deficits, moving all 4 extremities Psychiatric: Normal mood and affect   Laboratory Data:  Recent Labs    12/26/24 1418  WBC 10.8*  HGB 12.8*  HCT 38.7*   Recent Labs    12/26/24 1418  NA 138  K 4.9  CL 100  CO2 23  GLUCOSE 103*  BUN 47*  CREATININE 6.50*  CALCIUM  9.0   No results for input(s): LABPT, INR in the last 72 hours. No results for input(s): LABURIN in the last 72 hours. Results for orders placed or performed during the hospital encounter of 06/14/20  SARS CORONAVIRUS 2 (TAT 6-24 HRS) Nasopharyngeal Nasopharyngeal Swab     Status: None   Collection Time: 06/14/20  2:24 PM   Specimen: Nasopharyngeal Swab  Result Value Ref Range Status   SARS Coronavirus 2 NEGATIVE NEGATIVE Final    Comment: (NOTE) SARS-CoV-2 target nucleic acids are NOT DETECTED.  The SARS-CoV-2 RNA is  generally detectable in upper and lower respiratory specimens during the acute phase of infection. Negative results do not preclude SARS-CoV-2 infection, do not rule out co-infections with other pathogens, and should not be used as the sole basis for treatment or other patient management decisions. Negative results must be combined with clinical observations, patient history, and epidemiological information. The expected result is Negative.  Fact Sheet for Patients: hairslick.no  Fact Sheet for Healthcare Providers: quierodirigir.com  This test is not yet approved or cleared by the [place]  FDA and  has been authorized for detection and/or diagnosis of SARS-CoV-2 by FDA under an Emergency Use Authorization (EUA). This EUA will remain  in effect (meaning this test can be used) for the duration of the COVID-19 declaration under Se ction 564(b)(1) of the Act, 21 U.S.C. section 360bbb-3(b)(1), unless the authorization is terminated or revoked sooner.  Performed at [facility], [address], [place], [place] [address]      Radiologic Imaging: CT images were personally reviewed and interpreted.  On my review there is 1 nonobstructing right lower pole calculus in addition to his right ureteral calculus  DG OR UROLOGY CYSTO IMAGE [facility] ONLY) Result Date: 12/26/2024 There is  no interpretation for this exam.  This order is for images obtained during a surgical procedure.  Please See Surgeries Tab for more information regarding the procedure.   CT ABDOMEN PELVIS WO CONTRAST Result Date: 12/26/2024 EXAM: CT ABDOMEN AND PELVIS WITHOUT CONTRAST 12/26/2024 07:04:16 PM TECHNIQUE: CT of the abdomen and pelvis was performed without the administration of intravenous contrast. Multiplanar reformatted images are provided for review. Automated exposure control, iterative reconstruction, and/or weight-based adjustment of the mA/kV was  utilized to reduce the radiation dose to as low as reasonably achievable. COMPARISON: CT abdomen and pelvis 12/01/2018. CLINICAL HISTORY: single kidney, new onset kidney failure, RLQ abdominal pain. FINDINGS: LOWER CHEST: There is atelectasis in the lung bases. There is a trace right pleural effusion. LIVER: The liver is unremarkable. GALLBLADDER AND BILE DUCTS: Gallbladder is unremarkable. No biliary ductal dilatation. SPLEEN: No acute abnormality. PANCREAS: No acute abnormality. ADRENAL GLANDS: No acute abnormality. KIDNEYS, URETERS AND BLADDER: Solitary right kidney is present. There is a 6 x 5 mm calculus in the distal right ureter just proximal to the ureterovesical junction. There are additional right renal calculi measuring up to 6 mm. There is resultant mild to moderate right sided hydroureteronephrosis with mild right perinephric fat stranding. Bladder is completely decompressed. GI AND BOWEL: Stomach demonstrates no acute abnormality. The appendix appears normal. There is no bowel obstruction. PERITONEUM AND RETROPERITONEUM: No ascites. No free air. There is trace fluid in the pelvis. VASCULATURE: Aorta is normal in caliber. There are atherosclerotic calcifications of the aorta and iliac arteries. LYMPH NODES: No lymphadenopathy. REPRODUCTIVE ORGANS: No acute abnormality. BONES AND SOFT TISSUES: No acute osseous abnormality. Right inguinal hernia repair mesh present. IMPRESSION: 1. Solitary right kidney with a 6 x 5 mm calculus in the distal right ureter resulting in mild to moderate right-sided hydroureteronephrosis. 2.  Additional right renal calculi measure up to 6 mm. 3. Trace free fluid in the pelvis. 4. Trace right pleural effusion and atelectasis in the lung bases. 5. Right inguinal hernia repair mesh. Electronically signed by: Greig Pique MD 12/26/2024 07:26 PM EST RP Workstation: [id number]     12/26/2024, 8:43 PM  Glendia Barba,  MD         [1]  No outpatient medications have been  marked as taking for the 12/26/24 encounter [facility] Encounter).  [2] No Known Allergies  "

## 2024-12-26 NOTE — Assessment & Plan Note (Signed)
 No complaints of chest pain and EKG nonacute Continue aspirin , atorvastatin , ezetimibe , losartan  and isosorbide 

## 2024-12-26 NOTE — Assessment & Plan Note (Addendum)
 Acute kidney injury secondary to obstructive uropathy Single kidney, congenital Patient going for urgent cystoscopy with stent placement by urology Expecting gradual improvement in renal function following procedure Pain control Further management per urology

## 2024-12-26 NOTE — Transfer of Care (Signed)
 Immediate Anesthesia Transfer of Care Note  Patient: Daniel Lewis  Procedure(s) Performed: CYSTOSCOPY, WITH STENT INSERTION and right retrograde pyelogram (Right)  Patient Location: PACU  Anesthesia Type:General  Level of Consciousness: awake, alert , and oriented  Airway & Oxygen Therapy: Patient Spontanous Breathing  Post-op Assessment: Report given to RN and Post -op Vital signs reviewed and stable  Post vital signs: Reviewed and stable  Last Vitals:  Vitals Value Taken Time  BP 138/60 12/26/24 21:30  Temp 36.4 C 12/26/24 21:25  Pulse 64 12/26/24 21:31  Resp 14 12/26/24 21:31  SpO2 94 % 12/26/24 21:31  Vitals shown include unfiled device data.  Last Pain:  Vitals:   12/26/24 2125  TempSrc:   PainSc: 0-No pain         Complications: No notable events documented.

## 2024-12-26 NOTE — ED Triage Notes (Signed)
 Patient presents to [facility] for RLQ pain and urinary freq x Saturday. Symptoms worsened Sunday. Wife states he has one kidney and concerned with abdominal swelling. Wife states she gave him a dose of Tylenol  Sunday.

## 2024-12-26 NOTE — Assessment & Plan Note (Signed)
 No acute issues suspected

## 2024-12-26 NOTE — Hospital Course (Signed)
 Hypertension, HLD, prostate cancer with congenital single kidney being admitted with an obstructive ureteral calculus.  Patient has a 2-day history of right lower quadrant pain. In the ED hypertensive to 176/91 with otherwise normal vitals Labs notable for WBC 11,000 with lactic acid 0.8 and urinalysis not consistent with infection Creatinine 6.5 with most recent baseline 1.26 Other labs unremarkable EKG showed sinus rhythm at 69 CT abdomen and pelvis showed a solitary kidney with a 6 x 5 mm calculus distal right ureter and mild to moderate right-sided hydronephrosis.  The ED provider spoke with on-call urologist Dr. Twylla who would like to take patient to the OR for cystoscopy and stent  Admission requested

## 2024-12-26 NOTE — Assessment & Plan Note (Signed)
 Continue atorvastatin  Continue atorvastatin .

## 2024-12-26 NOTE — ED Notes (Signed)
 Could not get coude catheter in. RN asked 2nd RN to attempt.

## 2024-12-26 NOTE — Assessment & Plan Note (Addendum)
 Obstructive uropathy-obstructing ureteral calculus Creatinine 6.5, prior baseline 1.26 Expecting improvement with urgent cystoscopy with ureteral stent placement Monitor renal function and avoid nephrotoxins

## 2024-12-26 NOTE — Anesthesia Preprocedure Evaluation (Signed)
"                                    Anesthesia Evaluation  Patient identified by MRN, date of birth, ID band Patient awake    Reviewed: Allergy & Precautions, H&P , NPO status , Patient's Chart, lab work & pertinent test results  History of Anesthesia Complications Negative for: history of anesthetic complications  Airway Mallampati: III  TM Distance: <3 FB     Dental  (+) Chipped, Missing, Dental Advidsory Given   Pulmonary neg pulmonary ROS          Cardiovascular Exercise Tolerance: Good hypertension, (-) angina + CAD and + Past MI (20+ years ago)  (-) dysrhythmias (-) Valvular Problems/Murmurs     Neuro/Psych negative neurological ROS  negative psych ROS   GI/Hepatic Neg liver ROS,GERD  ,,  Endo/Other  neg diabetesHypothyroidism    Renal/GU CRFRenal disease (stage 3 CKD, kidney stones)     Musculoskeletal   Abdominal   Peds  Hematology  (+) Blood dyscrasia, anemia   Anesthesia Other Findings Past Medical History: No date: Anemia No date: ASHD (arteriosclerotic heart disease) No date: Chronic kidney disease No date: Erectile dysfunction No date: Hyperlipidemia No date: Hypertension No date: Hypothyroidism No date: MI (myocardial infarction) (HCC) No date: Prostate cancer (HCC) No date: Vertigo  Past Surgical History: No date: CHOLECYSTECTOMY No date: COLONOSCOPY 12/16/2015: COLONOSCOPY WITH PROPOFOL ; N/A     Comment:  Procedure: COLONOSCOPY WITH PROPOFOL ;  Surgeon: Lamar ONEIDA Holmes, MD;  Location: [facility] END[facility]COPY;  Service:               Endoscopy;  Laterality: N/A; No date: INGUINAL HERNIA REPAIR No date: PROSTATECTOMY  BMI    Body Mass Index:  19.46 kg/m      Reproductive/Obstetrics negative OB ROS                              Anesthesia Physical Anesthesia Plan  ASA: 3  Anesthesia Plan: General   Post-op Pain Management:    Induction: Intravenous  PONV Risk Score and Plan: 2 and  Treatment may vary due to age or medical condition, Propofol  infusion and TIVA  Airway Management Planned: Natural Airway and Simple Face Mask  Additional Equipment:   Intra-op Plan:   Post-operative Plan:   Informed Consent: I have reviewed the patients History and Physical, chart, labs and discussed the procedure including the risks, benefits and alternatives for the proposed anesthesia with the patient or authorized representative who has indicated his/her understanding and acceptance.     Dental Advisory Given  Plan Discussed with: Anesthesiologist, CRNA and Surgeon  Anesthesia Plan Comments:          Anesthesia Quick Evaluation  "

## 2024-12-27 ENCOUNTER — Other Ambulatory Visit: Payer: Self-pay | Admitting: Urology

## 2024-12-27 ENCOUNTER — Encounter: Payer: Self-pay | Admitting: Urology

## 2024-12-27 DIAGNOSIS — N201 Calculus of ureter: Secondary | ICD-10-CM

## 2024-12-27 DIAGNOSIS — N132 Hydronephrosis with renal and ureteral calculous obstruction: Secondary | ICD-10-CM | POA: Diagnosis not present

## 2024-12-27 DIAGNOSIS — N390 Urinary tract infection, site not specified: Secondary | ICD-10-CM | POA: Diagnosis present

## 2024-12-27 LAB — BLOOD GAS, VENOUS
Bicarbonate: 24.3 mmol/L (ref 20.0–28.0)
O2 Saturation: 24.5 mmol/L (ref 0.0–2.0)
Patient temperature: 37
Patient temperature: 37
pCO2, Ven: 44 mmHg (ref 44–60)
pH, Ven: 7.35 (ref 7.25–7.43)
pO2, Ven: <31 mmol/L — AB (ref 32–45)

## 2024-12-27 LAB — CBC
HCT: 37.2 % — ABNORMAL LOW (ref 39.0–52.0)
Hemoglobin: 12.5 g/dL — ABNORMAL LOW (ref 13.0–17.0)
MCH: 31.8 pg (ref 26.0–34.0)
MCHC: 33.6 g/dL (ref 30.0–36.0)
MCV: 94.7 fL (ref 80.0–100.0)
Platelets: 194 10*3/uL (ref 150–400)
RBC: 3.93 MIL/uL — ABNORMAL LOW (ref 4.22–5.81)
RDW: 12.3 % (ref 11.5–15.5)
WBC: 7.9 10*3/uL (ref 4.0–10.5)
nRBC: 0 % (ref 0.0–0.2)

## 2024-12-27 LAB — BASIC METABOLIC PANEL WITH GFR
Anion gap: 10 (ref 5–15)
BUN: 38 mg/dL — ABNORMAL HIGH (ref 8–23)
CO2: 22 mmol/L (ref 22–32)
Calcium: 8.9 mg/dL (ref 8.9–10.3)
Chloride: 107 mmol/L (ref 98–111)
Creatinine, Ser: 3.7 mg/dL — ABNORMAL HIGH (ref 0.61–1.24)
GFR, Estimated: 15 mL/min — ABNORMAL LOW
Glucose, Bld: 186 mg/dL — ABNORMAL HIGH (ref 70–99)
Potassium: 5.9 mmol/L — ABNORMAL HIGH (ref 3.5–5.1)
Sodium: 139 mmol/L (ref 135–145)

## 2024-12-27 LAB — POTASSIUM: Potassium: 4.6 mmol/L (ref 3.5–5.1)

## 2024-12-27 MED ORDER — TAMSULOSIN HCL 0.4 MG PO CAPS: 0.4000 mg | ORAL_CAPSULE | Freq: Every day | ORAL | Status: DC

## 2024-12-27 MED ORDER — CALCIUM GLUCONATE-NACL 1-0.675 GM/50ML-% IV SOLN
1.0000 g | Freq: Once | INTRAVENOUS | Status: AC
  Administered 2024-12-27: 1000 mg via INTRAVENOUS
  Filled 2024-12-27: qty 50

## 2024-12-27 MED ORDER — SODIUM ZIRCONIUM CYCLOSILICATE 10 G PO PACK
10.0000 g | PACK | Freq: Once | ORAL | Status: AC
  Administered 2024-12-27: 10 g via ORAL
  Filled 2024-12-27: qty 1

## 2024-12-27 NOTE — Progress Notes (Signed)
 Mobility Specialist Progress Note:    12/27/24 1645  Mobility  Activity Ambulated independently  Level of Assistance Modified independent, requires aide device or extra time  Assistive Device None  Distance Ambulated (ft) 640 ft  Range of Motion/Exercises Active;All extremities  Activity Response Tolerated well  Mobility visit 1 Mobility  Mobility Specialist Start Time (ACUTE ONLY) 1632  Mobility Specialist Stop Time (ACUTE ONLY) 1645  Mobility Specialist Time Calculation (min) (ACUTE ONLY) 13 min   Pt received in bed, wife at bedside. Eager for mobility, Modi to stand and ambulate with no AD. Tolerated well, asx throughout. Returned to room, all needs met.  Sherrilee Ditty Mobility Specialist Please contact via Special Educational Needs Teacher or  Rehab office at [phone]

## 2024-12-27 NOTE — TOC CM/SW Note (Signed)
 Transition of Care [facility]) - Inpatient Brief Assessment   Patient Details  Name: Daniel Lewis MRN: [id number] Date of Birth: Jan 09, 1935  Transition of [facility] CM/SW Contact:    Corean ONEIDA Haddock, RN Phone Number: 12/27/2024, 11:32 AM   Clinical Narrative:  Transition of Care Department [facility]) has reviewed patient and no TOC needs have been identified at this time.  If new patient transition needs arise, please place a TOC consult.   Transition of Care Asessment: Insurance and Status: Insurance coverage has been reviewed Patient has primary care physician: Yes     Prior/Current Home Services: No current home services Social Drivers of Health Review: SDOH reviewed no interventions necessary Readmission risk has been reviewed: Yes Transition of care needs: no transition of care needs at this time

## 2024-12-27 NOTE — Progress Notes (Signed)
 Urology Consult Follow Up  Subjective: Patient stated that he had a restful night.  Wife is at bedside.  He is having minimal bother from the stent.  Foley catheter in place draining clear yellow urine.  VSS afebrile  Serum creatinine has decreased from 6.50-3.7 this morning.  His baseline is 1.2.  His CBC is stable.  His is having good UOP.    Anti-infectives: Anti-infectives (From admission, onward)    None       Current Facility-Administered Medications  Medication Dose Route Frequency Provider Last Rate Last Admin   acetaminophen  (TYLENOL ) tablet 650 mg  650 mg Oral Q6H PRN Duncan, Hazel V, MD       Or   acetaminophen  (TYLENOL ) suppository 650 mg  650 mg Rectal Q6H PRN Cleatus Delayne GAILS, MD       aspirin  EC tablet 81 mg  81 mg Oral Daily Duncan, Hazel V, MD   81 mg at 12/27/24 9178   atorvastatin  (LIPITOR) tablet 20 mg  20 mg Oral Daily Duncan, Hazel V, MD   20 mg at 12/27/24 9178   heparin  injection 5,000 Units  5,000 Units Subcutaneous Q8H Duncan, Hazel V, MD   5,000 Units at 12/27/24 0541   HYDROmorphone  (DILAUDID ) injection 0.5-1 mg  0.5-1 mg Intravenous Q2H PRN Duncan, Hazel V, MD       isosorbide  mononitrate (IMDUR ) 24 hr tablet 30 mg  30 mg Oral QPM Duncan, Hazel V, MD       levothyroxine  (SYNTHROID ) tablet 75 mcg  75 mcg Oral Q0600 Duncan, Hazel V, MD   75 mcg at 12/27/24 0541   lidocaine  (XYLOCAINE ) 2 % jelly 1 Application  1 Application Urethral Once Davis, Hillary E, MD       losartan  (COZAAR ) tablet 50 mg  50 mg Oral Daily Duncan, Hazel V, MD   50 mg at 12/27/24 9178   ondansetron  (ZOFRAN ) tablet 4 mg  4 mg Oral Q6H PRN Duncan, Hazel V, MD       Or   ondansetron  (ZOFRAN ) injection 4 mg  4 mg Intravenous Q6H PRN Duncan, Hazel V, MD       oxyCODONE  (Oxy IR/ROXICODONE ) immediate release tablet 5 mg  5 mg Oral Q4H PRN Duncan, Hazel V, MD   5 mg at 12/26/24 2357   pantoprazole  (PROTONIX ) EC tablet 40 mg  40 mg Oral Daily Cleatus Delayne GAILS, MD   40 mg at 12/27/24 9178      Objective: Vital signs in last 24 hours: Temp:  [97.3 F (36.3 C)-98.9 F (37.2 C)] 98.4 F (36.9 C) (02/04 0800) Pulse Rate:  [62-76] 65 (02/04 0800) Resp:  [13-18] 18 (02/04 0800) BP: (128-214)/(56-94) 163/69 (02/04 0800) SpO2:  [93 %-100 %] 95 % (02/04 0800) Weight:  [63.6 kg] 63.6 kg (02/03 1416)  Intake/Output from previous day: 02/03 0701 - 02/04 0700 In: 1210 [I.V.:200; IV Piggyback:1010] Out: 3225 [Urine:3225] Intake/Output this shift: No intake/output data recorded.   Physical Exam Vitals and nursing note reviewed.  Constitutional:      Appearance: Normal appearance. He is normal weight.  HENT:     Head: Normocephalic and atraumatic.     Nose: Nose normal.     Mouth/Throat:     Mouth: Mucous membranes are dry.     Pharynx: Oropharynx is clear.  Eyes:     Extraocular Movements: Extraocular movements intact.     Conjunctiva/sclera: Conjunctivae normal.     Pupils: Pupils are equal, round, and reactive to light.  Pulmonary:  Effort: Pulmonary effort is normal.  Abdominal:     General: Abdomen is flat.     Palpations: Abdomen is soft.  Genitourinary:    Penis: Normal.      Comments: Foley in place draining clear yellow urine  Musculoskeletal:     Cervical back: Normal range of motion.  Neurological:     Mental Status: He is alert.     Lab Results:  Recent Labs    12/26/24 1418 12/27/24 0539  WBC 10.8* 7.9  HGB 12.8* 12.5*  HCT 38.7* 37.2*  PLT 226 194   BMET Recent Labs    12/26/24 1418 12/27/24 0539  NA 138 139  K 4.9 5.9*  CL 100 107  CO2 23 22  GLUCOSE 103* 186*  BUN 47* 38*  CREATININE 6.50* 3.70*  CALCIUM  9.0 8.9   PT/INR No results for input(s): LABPROT, INR in the last 72 hours. ABG Recent Labs    12/26/24 1826  HCO3 24.3    Studies/Results: DG OR UROLOGY CYSTO IMAGE ([facility] ONLY) Result Date: 12/26/2024 There is no interpretation for this exam.  This order is for images obtained during a surgical procedure.   Please See Surgeries Tab for more information regarding the procedure.   CT ABDOMEN PELVIS WO CONTRAST Result Date: 12/26/2024 EXAM: CT ABDOMEN AND PELVIS WITHOUT CONTRAST 12/26/2024 07:04:16 PM TECHNIQUE: CT of the abdomen and pelvis was performed without the administration of intravenous contrast. Multiplanar reformatted images are provided for review. Automated exposure control, iterative reconstruction, and/or weight-based adjustment of the mA/kV was utilized to reduce the radiation dose to as low as reasonably achievable. COMPARISON: CT abdomen and pelvis 12/01/2018. CLINICAL HISTORY: single kidney, new onset kidney failure, RLQ abdominal pain. FINDINGS: LOWER CHEST: There is atelectasis in the lung bases. There is a trace right pleural effusion. LIVER: The liver is unremarkable. GALLBLADDER AND BILE DUCTS: Gallbladder is unremarkable. No biliary ductal dilatation. SPLEEN: No acute abnormality. PANCREAS: No acute abnormality. ADRENAL GLANDS: No acute abnormality. KIDNEYS, URETERS AND BLADDER: Solitary right kidney is present. There is a 6 x 5 mm calculus in the distal right ureter just proximal to the ureterovesical junction. There are additional right renal calculi measuring up to 6 mm. There is resultant mild to moderate right sided hydroureteronephrosis with mild right perinephric fat stranding. Bladder is completely decompressed. GI AND BOWEL: Stomach demonstrates no acute abnormality. The appendix appears normal. There is no bowel obstruction. PERITONEUM AND RETROPERITONEUM: No ascites. No free air. There is trace fluid in the pelvis. VASCULATURE: Aorta is normal in caliber. There are atherosclerotic calcifications of the aorta and iliac arteries. LYMPH NODES: No lymphadenopathy. REPRODUCTIVE ORGANS: No acute abnormality. BONES AND SOFT TISSUES: No acute osseous abnormality. Right inguinal hernia repair mesh present. IMPRESSION: 1. Solitary right kidney with a 6 x 5 mm calculus in the distal right  ureter resulting in mild to moderate right-sided hydroureteronephrosis. 2.  Additional right renal calculi measure up to 6 mm. 3. Trace free fluid in the pelvis. 4. Trace right pleural effusion and atelectasis in the lung bases. 5. Right inguinal hernia repair mesh. Electronically signed by: Greig Pique MD 12/26/2024 07:26 PM EST RP Workstation: [id number]     Assessment: 89 year old male with a history prostate cancer treated with prostatectomy and a congenital right solitary kidney who underwent emergent right ureteral stent placement secondary to obstructing distal ureteral calculus yesterday with Dr. Twylla.  - Serum creatinine is trending downward - He is tolerating the stent well  Plan: - Okay to remove  Foley catheter; please bladder scan if no void in 4 hours or patient complains of discomfort  - Stone was not addressed at this time and he will need to return in 2 to 3 weeks for outpatient definitive stone treatment with right ureteroscopy - Recommend tamsulosin  0.4 mg daily and oxybutynin IR 5 mg 3 times daily as needed for stent discomfort - I reviewed the outpatient procedure of ureteroscopy, laser lithotripsy and stent exchange with him and his wife along with risks.  Both he and his wife's questions were answered to their satisfaction and they are agreeable with the plan - I placed a booking sheet and will contact the patient to schedule      LOS: 0 days    [employer] [facility] 12/27/2024

## 2024-12-27 NOTE — Progress Notes (Signed)
 " PROGRESS NOTE    Daniel Lewis  FMW:[id number] DOB: 1935-08-14 DOA: 12/26/2024 PCP: Auston Reyes BIRCH, MD    Assessment & Plan:   Principal Problem:   Hydronephrosis with urinary obstruction due to ureteral calculus with renal colic Active Problems:   AKI (acute kidney injury)   Renal colic   Congenital single kidney   CAD with remote history of angioplasty/no stent   Essential (primary) hypertension   Hyperlipidemia   Personal history of prostate cancer  Assessment and Plan: Hydronephrosis with urinary obstruction: due to ureteral calculus. S/p cystoscopy with right ureteral stent placement. Foley d/c and voiding trial today as per uro. Will outpatient w/ uro for definitive treatment of ureteral stone as per uro    AKI: secondary to obstructing ureteral calculus. Cr is trending down again from day prior.   Hyperkalemia: s/p lokelma  and calcium  gluconate. Repeat K is WNL. Resolved  HTN: continue on losartan , imdur    Hx of CAD: with remote history of angioplasty. No CP. Continue on aspirin , statin, losartan , imdur    HLD: continue on statin    Incomplete emptying of bladder: likely secondary to above.     DVT prophylaxis: heparin  SQ Code Status: full Family Communication: discussed pt's care w/ pt's family at bedside and answered their questions Disposition Plan: likely d/c back home  Level of care: Med-Surg  Status is: Inpatient Remains inpatient appropriate because: severity of illness    Consultants:  Uro   Procedures:  Antimicrobials:   Subjective: Pt c/o fatigue  Objective: Vitals:   12/26/24 2200 12/26/24 2216 12/27/24 0340 12/27/24 0800  BP: (!) 159/70 (!) 154/61 (!) 134/56 (!) 163/69  Pulse: 64 64 62 65  Resp: 13 18 18 18   Temp:  98.1 F (36.7 C) 98.4 F (36.9 C) 98.4 F (36.9 C)  TempSrc:  Oral  Oral  SpO2: 97% 95% 93% 95%  Weight:      Height:        Intake/Output Summary (Last 24 hours) at 12/27/2024 1036 Last data filed at  12/27/2024 0900 Gross per 24 hour  Intake 1550 ml  Output 3225 ml  Net -1675 ml   Filed Weights   12/26/24 1416  Weight: 63.6 kg    Examination:  General exam: Appears calm and comfortable  Respiratory system: Clear to auscultation. Respiratory effort normal. Cardiovascular system: S1 & S2+. No rubs, gallops or clicks.  Gastrointestinal system: Abdomen is nondistended, soft and nontender.Normal bowel sounds heard. Central nervous system: Alert and oriented. Moves all extremities Psychiatry: Judgement and insight appear normal. Mood & affect appropriate.     Data Reviewed: I have personally reviewed following labs and imaging studies  CBC: Recent Labs  Lab 12/26/24 1418 12/27/24 0539  WBC 10.8* 7.9  HGB 12.8* 12.5*  HCT 38.7* 37.2*  MCV 95.3 94.7  PLT 226 194   Basic Metabolic Panel: Recent Labs  Lab 12/26/24 1418 12/27/24 0539  NA 138 139  K 4.9 5.9*  CL 100 107  CO2 23 22  GLUCOSE 103* 186*  BUN 47* 38*  CREATININE 6.50* 3.70*  CALCIUM  9.0 8.9   GFR: Estimated Creatinine Clearance: 12.7 mL/min (A) (by C-G formula based on SCr of 3.7 mg/dL (H)). Liver Function Tests: Recent Labs  Lab 12/26/24 1806  AST 15  ALT 16  ALKPHOS 96  BILITOT 0.4  PROT 6.4*  ALBUMIN 3.8   Recent Labs  Lab 12/26/24 1418  LIPASE 49   No results for input(s): AMMONIA in the last 168 hours.  Coagulation Profile: No results for input(s): INR, PROTIME in the last 168 hours. Cardiac Enzymes: No results for input(s): CKTOTAL, CKMB, CKMBINDEX, TROPONINI in the last 168 hours. BNP (last 3 results) No results for input(s): PROBNP in the last 8760 hours. HbA1C: No results for input(s): HGBA1C in the last 72 hours. CBG: No results for input(s): GLUCAP in the last 168 hours. Lipid Profile: No results for input(s): CHOL, HDL, LDLCALC, TRIG, CHOLHDL, LDLDIRECT in the last 72 hours. Thyroid  Function Tests: No results for input(s): TSH, T4TOTAL,  FREET4, T3FREE, THYROIDAB in the last 72 hours. Anemia Panel: No results for input(s): VITAMINB12, FOLATE, FERRITIN, TIBC, IRON, RETICCTPCT in the last 72 hours. Sepsis Labs: Recent Labs  Lab 12/26/24 1806  LATICACIDVEN 0.8    No results found for this or any previous visit (from the past 240 hours).       Radiology Studies: DG OR UROLOGY CYSTO IMAGE ([facility] ONLY) Result Date: 12/26/2024 There is no interpretation for this exam.  This order is for images obtained during a surgical procedure.  Please See Surgeries Tab for more information regarding the procedure.   CT ABDOMEN PELVIS WO CONTRAST Result Date: 12/26/2024 EXAM: CT ABDOMEN AND PELVIS WITHOUT CONTRAST 12/26/2024 07:04:16 PM TECHNIQUE: CT of the abdomen and pelvis was performed without the administration of intravenous contrast. Multiplanar reformatted images are provided for review. Automated exposure control, iterative reconstruction, and/or weight-based adjustment of the mA/kV was utilized to reduce the radiation dose to as low as reasonably achievable. COMPARISON: CT abdomen and pelvis 12/01/2018. CLINICAL HISTORY: single kidney, new onset kidney failure, RLQ abdominal pain. FINDINGS: LOWER CHEST: There is atelectasis in the lung bases. There is a trace right pleural effusion. LIVER: The liver is unremarkable. GALLBLADDER AND BILE DUCTS: Gallbladder is unremarkable. No biliary ductal dilatation. SPLEEN: No acute abnormality. PANCREAS: No acute abnormality. ADRENAL GLANDS: No acute abnormality. KIDNEYS, URETERS AND BLADDER: Solitary right kidney is present. There is a 6 x 5 mm calculus in the distal right ureter just proximal to the ureterovesical junction. There are additional right renal calculi measuring up to 6 mm. There is resultant mild to moderate right sided hydroureteronephrosis with mild right perinephric fat stranding. Bladder is completely decompressed. GI AND BOWEL: Stomach demonstrates no acute  abnormality. The appendix appears normal. There is no bowel obstruction. PERITONEUM AND RETROPERITONEUM: No ascites. No free air. There is trace fluid in the pelvis. VASCULATURE: Aorta is normal in caliber. There are atherosclerotic calcifications of the aorta and iliac arteries. LYMPH NODES: No lymphadenopathy. REPRODUCTIVE ORGANS: No acute abnormality. BONES AND SOFT TISSUES: No acute osseous abnormality. Right inguinal hernia repair mesh present. IMPRESSION: 1. Solitary right kidney with a 6 x 5 mm calculus in the distal right ureter resulting in mild to moderate right-sided hydroureteronephrosis. 2.  Additional right renal calculi measure up to 6 mm. 3. Trace free fluid in the pelvis. 4. Trace right pleural effusion and atelectasis in the lung bases. 5. Right inguinal hernia repair mesh. Electronically signed by: Greig Pique MD 12/26/2024 07:26 PM EST RP Workstation: [id number]        Scheduled Meds:  aspirin  EC  81 mg Oral Daily   atorvastatin   20 mg Oral Daily   heparin   5,000 Units Subcutaneous Q8H   isosorbide  mononitrate  30 mg Oral QPM   levothyroxine   75 mcg Oral Q0600   lidocaine   1 Application Urethral Once   losartan   50 mg Oral Daily   pantoprazole   40 mg Oral Daily   Continuous Infusions:  LOS: 0 days       Anthony CHRISTELLA Pouch, MD [facility] Pager [phone]  If 7PM-7AM, please contact night-coverage [email or web address] 12/27/2024, 10:36 AM   "

## 2024-12-27 NOTE — Progress Notes (Signed)
 Surgical Physician Order Form [facility]   * Scheduling expectation :  2-3 weeks with Dr. Twylla   *Length of Case:   *Clearance needed: no  *Anticoagulation Instructions: N/A  *Aspirin  Instructions: Ok to continue Aspirin   *Post-op visit Date/Instructions:   TBD  *Diagnosis: Right Ureteral Stone  *Procedure: right  Ureteroscopy w/laser lithotripsy & stent exchange (47643)   Additional orders: N/A  -Admit type: OUTpatient  -Anesthesia: General  -VTE Prophylaxis Standing Order SCD's       Other:   -Standing Lab Orders Per Anesthesia    Lab other: UA&Urine Culture  -Standing Test orders EKG/Chest x-ray per Anesthesia       Test other:   - Medications:  Ancef  1gm IV  -Other orders:  N/A

## 2024-12-27 NOTE — Progress Notes (Signed)
 Mobility Specialist Progress Note:    12/27/24 1100  Mobility  Activity Ambulated independently  Level of Assistance Modified independent, requires aide device or extra time  Assistive Device None  Distance Ambulated (ft) 320 ft  Range of Motion/Exercises Active;All extremities  Activity Response Tolerated well  Mobility visit 1 Mobility  Mobility Specialist Start Time (ACUTE ONLY) 1052  Mobility Specialist Stop Time (ACUTE ONLY) 1106  Mobility Specialist Time Calculation (min) (ACUTE ONLY) 14 min   Pt received in bed, agreeable to mobility. ModI to stand and ambulate with no AD. Tolerated well, asx throughout. Returned to room, all needs met.  Sherrilee Ditty Mobility Specialist Please contact via Special Educational Needs Teacher or  Rehab office at [phone]

## 2024-12-28 DIAGNOSIS — N132 Hydronephrosis with renal and ureteral calculous obstruction: Secondary | ICD-10-CM | POA: Diagnosis not present

## 2024-12-28 LAB — BASIC METABOLIC PANEL WITH GFR
Anion gap: 9 (ref 5–15)
BUN: 32 mg/dL — ABNORMAL HIGH (ref 8–23)
CO2: 25 mmol/L (ref 22–32)
Calcium: 8.8 mg/dL — ABNORMAL LOW (ref 8.9–10.3)
Chloride: 106 mmol/L (ref 98–111)
Creatinine, Ser: 1.49 mg/dL — ABNORMAL HIGH (ref 0.61–1.24)
GFR, Estimated: 45 mL/min — ABNORMAL LOW
Glucose, Bld: 108 mg/dL — ABNORMAL HIGH (ref 70–99)
Potassium: 4.5 mmol/L (ref 3.5–5.1)
Sodium: 139 mmol/L (ref 135–145)

## 2024-12-28 LAB — CBC
HCT: 31.9 % — ABNORMAL LOW (ref 39.0–52.0)
Hemoglobin: 10.6 g/dL — ABNORMAL LOW (ref 13.0–17.0)
MCH: 32.1 pg (ref 26.0–34.0)
MCHC: 33.2 g/dL (ref 30.0–36.0)
MCV: 96.7 fL (ref 80.0–100.0)
Platelets: 207 10*3/uL (ref 150–400)
RBC: 3.3 MIL/uL — ABNORMAL LOW (ref 4.22–5.81)
RDW: 12.8 % (ref 11.5–15.5)
WBC: 10.6 10*3/uL — ABNORMAL HIGH (ref 4.0–10.5)
nRBC: 0 % (ref 0.0–0.2)

## 2024-12-28 MED ORDER — TAMSULOSIN HCL 0.4 MG PO CAPS: 0.4000 mg | ORAL_CAPSULE | Freq: Every day | ORAL | 0 refills | Status: AC

## 2024-12-28 NOTE — Discharge Summary (Signed)
 Physician Discharge Summary  [name] FMW:[id number] DOB: [birth year] DOA: 12/26/2024  PCP: Auston Reyes BIRCH, MD  Admit date: 12/26/2024 Discharge date: 12/28/2024  Admitted From: home Disposition:  home   Recommendations for Outpatient Follow-up:  Follow up with PCP in 1-2 weeks F/u w/ uro, Dr. Twylla, in 1-2 weeks   Home Health: no  Equipment/Devices:  Discharge Condition: stable  CODE STATUS: full  Diet recommendation: Heart Healthy   Brief/Interim Summary: HPI was taken from Dr. Cleatus: Daniel Lewis is a 89 y.o. male with medical history significant for Hypertension, prostate cancer with congenital single kidney, CAD with remote history of LAD angioplasty without stent, prior syncopal episodes and symptomatic bradycardia being admitted with an AKI secondary to obstructive uropathy from  ureteral calculus  requiring urgent cystoscopy/ureteral stent.  Patient has a 2-day history of right lower quadrant pain.  Has had no fever or chills. In the ED hypertensive to 176/91 with otherwise normal vitals Labs notable for WBC 11,000 with lactic acid 0.8 and urinalysis not consistent with infection Creatinine 6.5 with most recent baseline 1.26 Other labs unremarkable EKG showed sinus rhythm at 69 CT abdomen and pelvis showed a solitary kidney with a 6 x 5 mm calculus distal right ureter and mild to moderate right-sided hydronephrosis.   The ED provider spoke with on-call urologist Dr. Twylla who would like to take patient to the OR for cystoscopy and stent   Admission requested   Discharge Diagnoses:  Principal Problem:   Hydronephrosis with urinary obstruction due to ureteral calculus with renal colic Active Problems:   AKI (acute kidney injury)   Renal colic   Congenital single kidney   CAD with remote history of angioplasty/no stent   Essential (primary) hypertension   Hyperlipidemia   Personal history of prostate cancer   UTI (urinary tract  infection)  Hydronephrosis with urinary obstruction: due to ureteral calculus. S/p cystoscopy with right ureteral stent placement. Foley d/c and pt was able urinate independently. Will outpatient w/ uro for definitive treatment of ureteral stone as per uro    AKI: secondary to obstructing ureteral calculus. Cr is trending down again from day prior.   Hyperkalemia: s/p lokelma  and calcium  gluconate. Repeat K is WNL. Resolved  HTN: continue on losartan , imdur    Hx of CAD: with remote history of angioplasty. No CP. Continue on aspirin , statin, losartan , imdur    HLD: continue on statin    Incomplete emptying of bladder: likely secondary to above.   Discharge Instructions  Discharge Instructions     Diet - low sodium heart healthy   Complete by: As directed    Discharge instructions   Complete by: As directed    F/u w/ PCP in 1-2 weeks. F/u w/ urology, Dr. Twylla, in 2 weeks   Increase activity slowly   Complete by: As directed    No wound care   Complete by: As directed       Allergies as of 12/28/2024   No Known Allergies      Medication List     TAKE these medications    aspirin  EC 81 MG tablet Take 81 mg by mouth daily.   atorvastatin  20 MG tablet Commonly known as: LIPITOR Take 1 tablet (20 mg total) by mouth daily.   hydrocortisone  2.5 % cream Apply topically 3 (three) times a week. Apply to scaly area on face 3 nights a week, Monday, Wednesday, Friday for seborrheic dermatitis   isosorbide  mononitrate 30 MG 24 hr tablet Commonly known  as: IMDUR  Take 1 tablet (30 mg total) by mouth every evening.   losartan  50 MG tablet Commonly known as: COZAAR  Take 1 tablet (50 mg total) by mouth daily.   OMEGA 3-6-9 COMPLEX PO Take 1 capsule by mouth daily.   OVER THE COUNTER MEDICATION Take 2 capsules by mouth daily. Multi Greens   pantoprazole  40 MG tablet Commonly known as: PROTONIX  Take 40 mg by mouth 2 (two) times daily before a meal.   Synthroid  75 MCG  tablet Generic drug: levothyroxine  Take 75 mcg by mouth daily before breakfast.   tamsulosin  0.4 MG Caps capsule Commonly known as: FLOMAX  Take 1 capsule (0.4 mg total) by mouth daily after supper.   Vitamin C  500 MG Caps Take 500 mg by mouth daily.   VITAMIN D (CHOLECALCIFEROL) PO Take 1 tablet by mouth daily.   zinc gluconate 50 MG tablet Take 50 mg by mouth daily.        Allergies[1]  Consultations: Uro    Procedures/Studies: DG OR UROLOGY CYSTO IMAGE ([facility] ONLY) Result Date: 12/26/2024 There is no interpretation for this exam.  This order is for images obtained during a surgical procedure.  Please See Surgeries Tab for more information regarding the procedure.   CT ABDOMEN PELVIS WO CONTRAST Result Date: 12/26/2024 EXAM: CT ABDOMEN AND PELVIS WITHOUT CONTRAST 12/26/2024 07:04:16 PM TECHNIQUE: CT of the abdomen and pelvis was performed without the administration of intravenous contrast. Multiplanar reformatted images are provided for review. Automated exposure control, iterative reconstruction, and/or weight-based adjustment of the mA/kV was utilized to reduce the radiation dose to as low as reasonably achievable. COMPARISON: CT abdomen and pelvis 12/01/2018. CLINICAL HISTORY: single kidney, new onset kidney failure, RLQ abdominal pain. FINDINGS: LOWER CHEST: There is atelectasis in the lung bases. There is a trace right pleural effusion. LIVER: The liver is unremarkable. GALLBLADDER AND BILE DUCTS: Gallbladder is unremarkable. No biliary ductal dilatation. SPLEEN: No acute abnormality. PANCREAS: No acute abnormality. ADRENAL GLANDS: No acute abnormality. KIDNEYS, URETERS AND BLADDER: Solitary right kidney is present. There is a 6 x 5 mm calculus in the distal right ureter just proximal to the ureterovesical junction. There are additional right renal calculi measuring up to 6 mm. There is resultant mild to moderate right sided hydroureteronephrosis with mild right perinephric fat  stranding. Bladder is completely decompressed. GI AND BOWEL: Stomach demonstrates no acute abnormality. The appendix appears normal. There is no bowel obstruction. PERITONEUM AND RETROPERITONEUM: No ascites. No free air. There is trace fluid in the pelvis. VASCULATURE: Aorta is normal in caliber. There are atherosclerotic calcifications of the aorta and iliac arteries. LYMPH NODES: No lymphadenopathy. REPRODUCTIVE ORGANS: No acute abnormality. BONES AND SOFT TISSUES: No acute osseous abnormality. Right inguinal hernia repair mesh present. IMPRESSION: 1. Solitary right kidney with a 6 x 5 mm calculus in the distal right ureter resulting in mild to moderate right-sided hydroureteronephrosis. 2.  Additional right renal calculi measure up to 6 mm. 3. Trace free fluid in the pelvis. 4. Trace right pleural effusion and atelectasis in the lung bases. 5. Right inguinal hernia repair mesh. Electronically signed by: Greig Pique MD 12/26/2024 07:26 PM EST RP Workstation: [id number]   (Echo, Carotid, EGD, Colonoscopy, ERCP)    Subjective: pt c/o fatigue    Discharge Exam: Vitals:   12/28/24 0428 12/28/24 0700  BP: 133/66 (!) 159/77  Pulse: (!) 59 (!) 56  Resp: 18 18  Temp: 98.1 F (36.7 C) 98.4 F (36.9 C)  SpO2: 95% 95%   Vitals:  12/27/24 1658 12/27/24 2008 12/28/24 0428 12/28/24 0700  BP: (!) 145/88 139/68 133/66 (!) 159/77  Pulse: 67 61 (!) 59 (!) 56  Resp:  18 18 18   Temp: 98.5 F (36.9 C) 98.2 F (36.8 C) 98.1 F (36.7 C) 98.4 F (36.9 C)  TempSrc: Oral   Oral  SpO2: 97% 97% 95% 95%  Weight:      Height:        General: Pt is alert, awake, not in acute distress Cardiovascular: S1/S2 +, no rubs, no gallops Respiratory: CTA bilaterally, no wheezing, no rhonchi Abdominal: Soft, NT, ND, bowel sounds + Extremities: no edema, no cyanosis    The results of significant diagnostics from this hospitalization (including imaging, microbiology, ancillary and laboratory) are listed below for  reference.     Microbiology: No results found for this or any previous visit (from the past 240 hours).   Labs: BNP (last 3 results) No results for input(s): BNP in the last 8760 hours. Basic Metabolic Panel: Recent Labs  Lab 12/26/24 1418 12/27/24 0539 12/27/24 1522 12/28/24 0649  NA 138 139  --  139  K 4.9 5.9* 4.6 4.5  CL 100 107  --  106  CO2 23 22  --  25  GLUCOSE 103* 186*  --  108*  BUN 47* 38*  --  32*  CREATININE 6.50* 3.70*  --  1.49*  CALCIUM  9.0 8.9  --  8.8*   Liver Function Tests: Recent Labs  Lab 12/26/24 1806  AST 15  ALT 16  ALKPHOS 96  BILITOT 0.4  PROT 6.4*  ALBUMIN 3.8   Recent Labs  Lab 12/26/24 1418  LIPASE 49   No results for input(s): AMMONIA in the last 168 hours. CBC: Recent Labs  Lab 12/26/24 1418 12/27/24 0539 12/28/24 0649  WBC 10.8* 7.9 10.6*  HGB 12.8* 12.5* 10.6*  HCT 38.7* 37.2* 31.9*  MCV 95.3 94.7 96.7  PLT 226 194 207   Cardiac Enzymes: No results for input(s): CKTOTAL, CKMB, CKMBINDEX, TROPONINI in the last 168 hours. BNP: Invalid input(s): POCBNP CBG: No results for input(s): GLUCAP in the last 168 hours. D-Dimer No results for input(s): DDIMER in the last 72 hours. Hgb A1c No results for input(s): HGBA1C in the last 72 hours. Lipid Profile No results for input(s): CHOL, HDL, LDLCALC, TRIG, CHOLHDL, LDLDIRECT in the last 72 hours. Thyroid  function studies No results for input(s): TSH, T4TOTAL, T3FREE, THYROIDAB in the last 72 hours.  Invalid input(s): FREET3 Anemia work up No results for input(s): VITAMINB12, FOLATE, FERRITIN, TIBC, IRON, RETICCTPCT in the last 72 hours. Urinalysis    Component Value Date/Time   COLORURINE STRAW (A) 12/26/2024 1725   APPEARANCEUR HAZY (A) 12/26/2024 1725   LABSPEC 1.005 12/26/2024 1725   PHURINE 6.0 12/26/2024 1725   GLUCOSEU NEGATIVE 12/26/2024 1725   HGBUR SMALL (A) 12/26/2024 1725   BILIRUBINUR NEGATIVE  12/26/2024 1725   BILIRUBINUR negative 12/26/2024 1317   KETONESUR NEGATIVE 12/26/2024 1725   KETONESUR negative 12/26/2024 1317   PROTEINUR NEGATIVE 12/26/2024 1725   PROTEINUR negative 12/26/2024 1317   UROBILINOGEN 0.2 12/26/2024 1317   NITRITE NEGATIVE 12/26/2024 1725   NITRITE Negative 12/26/2024 1317   LEUKOCYTESUR TRACE (A) 12/26/2024 1725   LEUKOCYTESUR Negative 12/26/2024 1317   Sepsis Labs Recent Labs  Lab 12/26/24 1418 12/27/24 0539 12/28/24 0649  WBC 10.8* 7.9 10.6*   Microbiology No results found for this or any previous visit (from the past 240 hours).   Time coordinating discharge: 27  minutes  SIGNED:   Anthony CHRISTELLA Pouch, MD  [facility] 12/28/2024, 1:33 PM Pager   If 7PM-7AM, please contact night-coverage [email or web address]      [1] No Known Allergies

## 2024-12-28 NOTE — Progress Notes (Signed)
 Mobility Specialist Progress Note:    12/28/24 1253  Mobility  Activity Ambulated with assistance  Level of Assistance Modified independent, requires aide device or extra time  Assistive Device None  Distance Ambulated (ft) 480 ft  Range of Motion/Exercises Active;All extremities  Activity Response Tolerated well  Mobility visit 1 Mobility  Mobility Specialist Start Time (ACUTE ONLY) 1220  Mobility Specialist Stop Time (ACUTE ONLY) 1234  Mobility Specialist Time Calculation (min) (ACUTE ONLY) 14 min    Daniel Lewis Mobility Specialist Please contact via Special Educational Needs Teacher or  Rehab office at [phone]

## 2024-12-28 NOTE — Progress Notes (Signed)
 Mobility Specialist Progress Note:    12/28/24 0934  Mobility  Activity Ambulated with assistance  Level of Assistance Modified independent, requires aide device or extra time  Assistive Device None  Distance Ambulated (ft) 480 ft  Range of Motion/Exercises Active;All extremities  Activity Response Tolerated well  Mobility visit 1 Mobility  Mobility Specialist Start Time (ACUTE ONLY) 0914  Mobility Specialist Stop Time (ACUTE ONLY) 0934  Mobility Specialist Time Calculation (min) (ACUTE ONLY) 20 min   Pt received sitting EOB, wife at bedside. Agreeable to mobility, ModI to stand and ambulate with no AD. Tolerated well, asx throughout. Returned to room, all needs met.  Sherrilee Ditty Mobility Specialist Please contact via Special Educational Needs Teacher or  Rehab office at [phone]

## 2024-12-29 ENCOUNTER — Other Ambulatory Visit: Payer: Self-pay

## 2024-12-29 DIAGNOSIS — N201 Calculus of ureter: Secondary | ICD-10-CM

## 2024-12-29 NOTE — Progress Notes (Unsigned)
 Surgical Physician Order Form [facility]   * Scheduling expectation :  2-3 weeks with Dr. Twylla   *Length of Case:   *Clearance needed: no  *Anticoagulation Instructions: N/A  *Aspirin  Instructions: Ok to continue Aspirin   *Post-op visit Date/Instructions:   TBD  *Diagnosis: Right Ureteral Stone  *Procedure: right  Ureteroscopy w/laser lithotripsy & stent exchange (47643)   Additional orders: N/A  -Admit type: OUTpatient  -Anesthesia: General  -VTE Prophylaxis Standing Order SCD's       Other:   -Standing Lab Orders Per Anesthesia    Lab other: UA&Urine Culture  -Standing Test orders EKG/Chest x-ray per Anesthesia       Test other:   - Medications:  Ancef  1gm IV  -Other orders:  N/A

## 2025-04-10 ENCOUNTER — Ambulatory Visit: Admitting: Dermatology

## 2025-04-24 ENCOUNTER — Ambulatory Visit: Admitting: Dermatology
